# Patient Record
Sex: Female | Born: 1940 | Race: White | Hispanic: No | Smoking: Never smoker
Health system: Southern US, Community
[De-identification: ages and names within clinical notes are randomized; demographics above are authoritative.]

## PROBLEM LIST (undated history)

## (undated) DIAGNOSIS — J449 Chronic obstructive pulmonary disease, unspecified: Secondary | ICD-10-CM

## (undated) DIAGNOSIS — J45909 Unspecified asthma, uncomplicated: Secondary | ICD-10-CM

## (undated) DIAGNOSIS — K429 Umbilical hernia without obstruction or gangrene: Secondary | ICD-10-CM

## (undated) DIAGNOSIS — K769 Liver disease, unspecified: Secondary | ICD-10-CM

## (undated) DIAGNOSIS — I10 Essential (primary) hypertension: Secondary | ICD-10-CM

## (undated) DIAGNOSIS — E119 Type 2 diabetes mellitus without complications: Secondary | ICD-10-CM

## (undated) DIAGNOSIS — M1990 Unspecified osteoarthritis, unspecified site: Secondary | ICD-10-CM

## (undated) HISTORY — PX: CHOLECYSTECTOMY: SHX55

## (undated) HISTORY — PX: BLADDER SURGERY: SHX569

## (undated) HISTORY — PX: ABDOMINAL HYSTERECTOMY: SHX81

## (undated) HISTORY — PX: OTHER SURGICAL HISTORY: SHX169

---

## 2005-03-28 ENCOUNTER — Ambulatory Visit: Payer: Self-pay | Admitting: *Deleted

## 2007-08-18 ENCOUNTER — Inpatient Hospital Stay: Payer: Self-pay | Admitting: Specialist

## 2007-08-18 ENCOUNTER — Other Ambulatory Visit: Payer: Self-pay

## 2008-03-16 ENCOUNTER — Ambulatory Visit: Payer: Self-pay | Admitting: Family Medicine

## 2009-01-03 ENCOUNTER — Ambulatory Visit: Payer: Self-pay | Admitting: Family Medicine

## 2009-12-03 ENCOUNTER — Emergency Department: Payer: Self-pay | Admitting: Emergency Medicine

## 2011-03-14 ENCOUNTER — Emergency Department: Payer: Self-pay | Admitting: Unknown Physician Specialty

## 2011-12-16 ENCOUNTER — Emergency Department: Payer: Self-pay | Admitting: Emergency Medicine

## 2011-12-16 LAB — COMPREHENSIVE METABOLIC PANEL
Albumin: 3.6 g/dL (ref 3.4–5.0)
Alkaline Phosphatase: 118 U/L (ref 50–136)
Anion Gap: 10 (ref 7–16)
BUN: 10 mg/dL (ref 7–18)
Bilirubin,Total: 0.4 mg/dL (ref 0.2–1.0)
Calcium, Total: 8.7 mg/dL (ref 8.5–10.1)
Chloride: 100 mmol/L (ref 98–107)
Co2: 31 mmol/L (ref 21–32)
Creatinine: 0.98 mg/dL (ref 0.60–1.30)
EGFR (Non-African Amer.): 60 — ABNORMAL LOW
SGOT(AST): 49 U/L — ABNORMAL HIGH (ref 15–37)
Sodium: 141 mmol/L (ref 136–145)
Total Protein: 7.9 g/dL (ref 6.4–8.2)

## 2011-12-16 LAB — CBC
HGB: 12.9 g/dL (ref 12.0–16.0)
MCH: 29.7 pg (ref 26.0–34.0)
MCHC: 33.7 g/dL (ref 32.0–36.0)
MCV: 88 fL (ref 80–100)
Platelet: 246 10*3/uL (ref 150–440)
RBC: 4.35 10*6/uL (ref 3.80–5.20)
RDW: 13.3 % (ref 11.5–14.5)
WBC: 7.2 10*3/uL (ref 3.6–11.0)

## 2011-12-16 LAB — SEDIMENTATION RATE: Erythrocyte Sed Rate: 35 mm/hr — ABNORMAL HIGH (ref 0–30)

## 2013-10-09 LAB — CBC
HCT: 40.5 % (ref 35.0–47.0)
HGB: 13.1 g/dL (ref 12.0–16.0)
MCH: 30 pg (ref 26.0–34.0)
MCHC: 32.4 g/dL (ref 32.0–36.0)
MCV: 93 fL (ref 80–100)
PLATELETS: 219 10*3/uL (ref 150–440)
RBC: 4.38 10*6/uL (ref 3.80–5.20)
RDW: 14.3 % (ref 11.5–14.5)
WBC: 7.4 10*3/uL (ref 3.6–11.0)

## 2013-10-09 LAB — COMPREHENSIVE METABOLIC PANEL
ANION GAP: 5 — AB (ref 7–16)
Albumin: 3.1 g/dL — ABNORMAL LOW (ref 3.4–5.0)
Alkaline Phosphatase: 156 U/L — ABNORMAL HIGH
BUN: 6 mg/dL — ABNORMAL LOW (ref 7–18)
Bilirubin,Total: 0.4 mg/dL (ref 0.2–1.0)
CHLORIDE: 103 mmol/L (ref 98–107)
CO2: 29 mmol/L (ref 21–32)
Calcium, Total: 9 mg/dL (ref 8.5–10.1)
Creatinine: 0.9 mg/dL (ref 0.60–1.30)
EGFR (African American): >60
EGFR (Non-African Amer.): >60
GLUCOSE: 157 mg/dL — AB (ref 65–99)
Osmolality: 275 (ref 275–301)
POTASSIUM: 4.1 mmol/L (ref 3.5–5.1)
SGOT(AST): 112 U/L — ABNORMAL HIGH (ref 15–37)
SGPT (ALT): 70 U/L (ref 12–78)
Sodium: 137 mmol/L (ref 136–145)
TOTAL PROTEIN: 7.8 g/dL (ref 6.4–8.2)

## 2013-10-09 LAB — TROPONIN I: Troponin-I: <0.02 ng/mL

## 2013-10-09 LAB — CK TOTAL AND CKMB (NOT AT ARMC)
CK, Total: 331 U/L — ABNORMAL HIGH (ref 21–215)
CK-MB: 1.5 ng/mL (ref 0.5–3.6)

## 2013-10-10 ENCOUNTER — Inpatient Hospital Stay: Payer: Self-pay | Admitting: Internal Medicine

## 2013-10-11 LAB — BASIC METABOLIC PANEL
Anion Gap: 6 — ABNORMAL LOW (ref 7–16)
BUN: 10 mg/dL (ref 7–18)
Calcium, Total: 9.4 mg/dL (ref 8.5–10.1)
Chloride: 99 mmol/L (ref 98–107)
Co2: 27 mmol/L (ref 21–32)
Creatinine: 0.86 mg/dL (ref 0.60–1.30)
EGFR (African American): >60
EGFR (Non-African Amer.): >60
Glucose: 322 mg/dL — ABNORMAL HIGH (ref 65–99)
Osmolality: 276 (ref 275–301)
Potassium: 3.6 mmol/L (ref 3.5–5.1)
Sodium: 132 mmol/L — ABNORMAL LOW (ref 136–145)

## 2013-10-11 LAB — CBC WITH DIFFERENTIAL/PLATELET
BASOS ABS: 0 10*3/uL (ref 0.0–0.1)
Basophil %: 0.3 %
EOS PCT: 0 %
Eosinophil #: 0 10*3/uL (ref 0.0–0.7)
HCT: 39.9 % (ref 35.0–47.0)
HGB: 13.1 g/dL (ref 12.0–16.0)
LYMPHS ABS: 1 10*3/uL (ref 1.0–3.6)
Lymphocyte %: 13.1 %
MCH: 30.1 pg (ref 26.0–34.0)
MCHC: 32.8 g/dL (ref 32.0–36.0)
MCV: 92 fL (ref 80–100)
Monocyte #: 0.3 x10 3/mm (ref 0.2–0.9)
Monocyte %: 3.8 %
NEUTROS ABS: 6.2 10*3/uL (ref 1.4–6.5)
Neutrophil %: 82.8 %
Platelet: 247 10*3/uL (ref 150–440)
RBC: 4.35 10*6/uL (ref 3.80–5.20)
RDW: 14.1 % (ref 11.5–14.5)
WBC: 7.5 10*3/uL (ref 3.6–11.0)

## 2013-10-11 LAB — MAGNESIUM: MAGNESIUM: 1.5 mg/dL — AB

## 2013-10-14 LAB — CULTURE, BLOOD (SINGLE)

## 2015-01-08 NOTE — Discharge Summary (Signed)
PATIENT NAME:  Kendra Clarke, Kendra Clarke MR#:  [id number] DATE OF BIRTH:  Jun 18, 1941  DATE OF ADMISSION:  10/10/2013 DATE OF DISCHARGE:  10/13/2013  DISCHARGE DIAGNOSES: 1.  Bacterial pneumonia.  2.  Accelerated hypertension.  3.  Diabetes mellitus type 2 with uncontrolled blood sugars secondary to steroids.  4.  Chronic obstructive pulmonary disease.   DISCHARGE MEDICATIONS: 1.  Symbicort 160/4.5 two puffs 4 times daily.  2.  Lisinopril 20 mg p.o. daily.  3.  Glipizide XL 5 mg p.o. daily.  4.  Atrovent 2 puffs 4 times daily.  5.  KCl 10 mEq p.o. daily.  6.  Lasix 20 mg p.o. daily.  7.  Amlodipine 10 mg p.o. daily.  8.  Metoprolol 50 mg p.o. q. 12 hours.  9.  Levaquin 750 mg every 24 hours for 5 days.  10.  Tussionex 5 mL every 12 hours for cough.  11.  Prednisone 20 mg 2 tablets daily for 3 days, 1 tablet daily for 3 days and then stop.   NOTE: The patient was given 50 mL of Tussionex and also Levaquin is given for 5 days.  We have increased the Norvasc to 10 mg daily and also started the metoprolol, that is a new medication, for blood pressure.   DIET: Low-sodium, low-fat, ADA diet.   CONSULTATIONS: None.   HOSPITAL COURSE:  1.  Pneumonia. A 74 year old female patient with hypertension and diabetes who came in because of shortness of breath. The patient does have history of hyperlipidemia, diabetes, and hypertension. Had fever and cough at home. The patient's chest x-ray on admission showed left lower lobe pneumonia. The patient was given IV Levaquin and IV Solu-Medrol. The patient was monitored on telemetry. She did improve a lot. She did have some cough but other than that she did not have any fever and O2 sats were like 92% on room air and with exertion they were 89%. The patient was discharged with Levaquin and prednisone. We wanted to see if she qualifies for home oxygen, but O2 sats were 89% on exertion, so discharged home in stable condition. She has COPD history so continue her  Symbicort, Atrovent, and also Levaquin and prednisone were given. The patient can follow up with her primary doctor.  2.  Diabetes mellitus, type 2. The patient had elevated blood sugars up to 400 range during the hospital course because of steroids and also her metformin was on hold because she had a CT chest with contrast, so she had elevated sugars due to combination of not getting metformin and also steroids. The patient's steroids have been tapered and also metformin restarted. At the time of discharge, sugars have been much improved and they were in 200 range.  3.  Accelerated hypertension. The patient's family says she is not really compliant with medications, and the patient's blood pressure stayed around 160s over 90 and also at times it was 190/80. The patient did not have any chest pain, but we had to increase her Norvasc to 10 mg daily and also added Lopressor because her heart rate also was elevated to 100s, so told her to be compliant with medications. Wrote prescriptions for Norvasc 10 mg and Lopressor 50 mg daily, and she is on lisinopril also.   LABORATORY AND DIAGNOSTICS: EKG showed sinus tachycardia at 113 beats per minute. CK is 331 on January 23rd. Troponins have been negative x3. Electrolytes on admission: Sodium 137, potassium 4.1, chloride 103, bicarb 29, BUN 6, creatinine 0.90, and glucose 157. LFTs:  The patient has AST of 112. 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 WBC 7.4 on admission, hemoglobin 13.1, hematocrit 40.5, and platelets 219. Blood cultures have been negative. The patient had a CT angio of the chest for PE protocol. The patient's CT of chest did not show any PE, but has left lower lobe opacity and also has scattered nodules in both lungs, likely post-infectious.   The patient's condition at time of discharge is stable.  TIME SPENT ON DISCHARGE PREPARATION: More than 30 minutes.  ____________________________ Katha HammingSnehalatha Nashanti Duquette,  MD sk:sb D: 10/15/2013 08:53:42 ET T: 10/15/2013 09:25:28 ET JOB#: [id number]  cc: Katha HammingSnehalatha Antia Rahal, MD, <Dictator> Katha HammingSNEHALATHA Nnaemeka Samson MD ELECTRONICALLY SIGNED 10/27/2013 15:30

## 2015-01-08 NOTE — H&P (Signed)
PATIENT NAME:  Kendra Clarke, Kendra Clarke MR#:  [id number] DATE OF BIRTH:  25-Nov-1940  DATE OF ADMISSION:  10/10/2013  PRIMARY CARE PHYSICIAN:  Nonlocal.   REFERRING PHYSICIAN:  Dr. Glenetta Hew.   CHIEF COMPLAINT:  Shortness of breath.   HISTORY OF PRESENT ILLNESS:  The patient is a 74 year old Caucasian female with a past medical history of hypertension, hyperlipidemia, diabetes mellitus, nonalcoholic steatohepatitis is presenting to the ER with a chief complaint of three day history of shortness of breath.  This is associated with dry cough and low-grade fever.  The patient is complaining of right-sided chest pain with cough.  When she is resting she denies any chest pain.  Blood pressure is initially high at 200/88.  Subsequently trended down to 184/91.  The patient's daughter is at bedside.  No similar complaints in the past.  The patient denies any dizziness or loss of consciousness.  She denies any history of COPD.   PAST MEDICAL HISTORY:  Hypertension, hyperlipidemia, diabetes mellitus, NASH, GERD.   PAST SURGICAL HISTORY:  Cholecystectomy.    ALLERGIES:  SHE IS ALLERGIC TO ALBUTEROL, CODEINE AND SULFA.   PSYCHOSOCIAL HISTORY:  Lives with nephew.  No history of smoking, alcohol or illicit drug usage.  The patient never smoked in her life.   FAMILY HISTORY:  Dad has cancer.  Mother has multiple sclerosis.   HOME MEDICATIONS:  Symbicort 2 puff inhalation 4 times a day, Norvasc 5 mg once daily, metformin 1000 mg 2 times a day, lisinopril 20 mg by mouth once daily, glipizide XL 5 mg by mouth once daily, furosemide 20 mg by mouth once daily, Atrovent 2 puffs inhalation 4 times a day.  REVIEW OF SYSTEMS:  CONSTITUTIONAL:  Denies any weakness.  Complaining of fatigue and fever.  EYES:  Denies blurry vision, double vision.  EARS, NOSE, THROAT:  No epistaxis.  Positive nasal discharge.  Complaining of headache.  RESPIRATION:  Complaining of dry cough.  No history of COPD.  CARDIOVASCULAR:  No chest pain  or palpitations.  GASTROINTESTINAL:  Denies nausea, vomiting, diarrhea.  No hematemesis.   GYNECOLOGIC AND BREAST:  Denies breast mass or vaginal discharge.  ENDOCRINE:  Denies polyuria, nocturia, thyroid problems.  HEMATOLOGIC AND LYMPHATIC:  No anemia, easy bruising, bleeding.  INTEGUMENT:  No acne, rash, lesions.  MUSCULOSKELETAL:  No joint pain in the neck and back.  Denies gout.  NEUROLOGIC:  No vertigo or ataxia.  PSYCHIATRIC:  No ADD, OCD.   PHYSICAL EXAMINATION: VITAL SIGNS:  Temperature 99.4, pulse 102, respirations 26, blood pressure 200/88.  Repeat blood pressure is at 184/91, pulse ox is 94%.  GENERAL APPEARANCE:  Not under acute distress.  Moderately built and nourished.  HEENT:  Normocephalic, atraumatic.  Pupils are equally reacting to light and accommodation.  No scleral icterus.  No conjunctival injection.  No sinus tenderness.  Moist mucous membranes.  NECK:  Supple.  No JVD.  No thyromegaly.  LUNGS:  Positive crackles and rhonchi in the left lower lobe.  Moderate air entry.  No wheezing.  CARDIOVASCULAR:  S1, S2 normal.  Regular rate and rhythm.  No murmurs.  GASTROINTESTINAL:  Soft.  Bowel sounds are positive in all four quadrants.  Nontender, nondistended.  No hepatosplenomegaly.  No masses felt.  NEUROLOGIC:  Awake, alert, oriented x 3.  Reflexes are 2+.  Motor and sensory are grossly intact.  EXTREMITIES:  No cyanosis.  No clubbing.  Trace edema is present.  Peripheral pulses are 2+. PSYCHIATRIC:  Normal mood and affect.  LABORATORY AND IMAGING STUDIES:  Glucose 157, BUN 6, creatinine 0.90.  The rest of the Chem-8 is normal, except anion gap which is low at 5.  Total protein 7.8, albumin 3.1, total bili is 0.4, alkaline phosphatase 156, AST 112, ALT 70, CK total 331, CK-MB 1.5.  Troponin less than 0.02.  CBC is normal.  A 12-lead EKG:  Sinus tachycardia at 113 beats per minute.  Normal PR and QRS interval.  No acute ST-T wave changes.   ASSESSMENT AND PLAN:  A  74 year old female presenting to the ER with a chief complaint of three day history of cough, fever and shortness of breath will be admitted with the following assessment and plan.  1.  Left lower lobe pneumonia.  Blood cultures were obtained and the patient is started on IV levofloxacin.  We will provide the patient Solu-Medrol IV for reactive airway disease.  We will provide nebulizer treatment with Atrovent as the patient is ALLERGIC TO ALBUTEROL.  2.  Gastroesophageal reflux disease.  The patient will be on gastrointestinal prophylaxis.  3.  Hypertension.  Blood pressure is very high.  We will resume home medications and up-titrate medications as needed basis.  4.  Hyperlipidemia.  Continue home medication.  5.  History of nonalcoholic steatohepatitis.  6.  Diabetes mellitus.  The patient will be on sliding scale insulin.  7.  We will provide gastrointestinal and deep vein thrombosis prophylaxis.   Diagnosis and plan of care was discussed in detail with the patient and her two daughters at bedside.  They verbalized understanding of the plan.   Total time spent on admission is 45 minutes.     ____________________________ Ramonita LabAruna Avira Tillison, MD ag:ea D: 10/10/2013 02:31:01 ET T: 10/10/2013 03:45:32 ET JOB#: [id number]  cc: Ramonita LabAruna Yurika Pereda, MD, <Dictator> Ramonita LabARUNA Johannah Rozas MD ELECTRONICALLY SIGNED 10/25/2013 2:03

## 2015-05-03 ENCOUNTER — Emergency Department: Payer: Medicare HMO

## 2015-05-03 ENCOUNTER — Encounter: Payer: Self-pay | Admitting: Urgent Care

## 2015-05-03 ENCOUNTER — Emergency Department
Admission: EM | Admit: 2015-05-03 | Discharge: 2015-05-04 | Disposition: A | Discharge status: Home / Self Care | Payer: Medicare HMO | Attending: Emergency Medicine | Admitting: Emergency Medicine

## 2015-05-03 DIAGNOSIS — I10 Essential (primary) hypertension: Secondary | ICD-10-CM | POA: Diagnosis not present

## 2015-05-03 DIAGNOSIS — E669 Obesity, unspecified: Secondary | ICD-10-CM | POA: Insufficient documentation

## 2015-05-03 DIAGNOSIS — Z79899 Other long term (current) drug therapy: Secondary | ICD-10-CM | POA: Insufficient documentation

## 2015-05-03 DIAGNOSIS — R1032 Left lower quadrant pain: Secondary | ICD-10-CM | POA: Insufficient documentation

## 2015-05-03 DIAGNOSIS — R109 Unspecified abdominal pain: Secondary | ICD-10-CM

## 2015-05-03 DIAGNOSIS — E119 Type 2 diabetes mellitus without complications: Secondary | ICD-10-CM | POA: Diagnosis not present

## 2015-05-03 HISTORY — DX: Type 2 diabetes mellitus without complications: E11.9

## 2015-05-03 HISTORY — DX: Unspecified osteoarthritis, unspecified site: M19.90

## 2015-05-03 HISTORY — DX: Chronic obstructive pulmonary disease, unspecified: J44.9

## 2015-05-03 HISTORY — DX: Essential (primary) hypertension: I10

## 2015-05-03 HISTORY — DX: Liver disease, unspecified: K76.9

## 2015-05-03 HISTORY — DX: Umbilical hernia without obstruction or gangrene: K42.9

## 2015-05-03 HISTORY — DX: Unspecified asthma, uncomplicated: J45.909

## 2015-05-03 LAB — URINALYSIS COMPLETE WITH MICROSCOPIC (ARMC ONLY)
Bilirubin Urine: NEGATIVE
Glucose, UA: 50 mg/dL — AB
Hgb urine dipstick: NEGATIVE
Ketones, ur: NEGATIVE mg/dL
Nitrite: NEGATIVE
PH: 5 (ref 5.0–8.0)
PROTEIN: NEGATIVE mg/dL
Specific Gravity, Urine: 1.023 (ref 1.005–1.030)

## 2015-05-03 LAB — CBC
HEMATOCRIT: 41.1 % (ref 35.0–47.0)
HEMOGLOBIN: 13.2 g/dL (ref 12.0–16.0)
MCH: 28.7 pg (ref 26.0–34.0)
MCHC: 32.2 g/dL (ref 32.0–36.0)
MCV: 89.1 fL (ref 80.0–100.0)
Platelets: 213 10*3/uL (ref 150–440)
RBC: 4.61 MIL/uL (ref 3.80–5.20)
RDW: 15 % — ABNORMAL HIGH (ref 11.5–14.5)
WBC: 6.6 10*3/uL (ref 3.6–11.0)

## 2015-05-03 LAB — COMPREHENSIVE METABOLIC PANEL
ALBUMIN: 3.6 g/dL (ref 3.5–5.0)
ALK PHOS: 93 U/L (ref 38–126)
ALT: 32 U/L (ref 14–54)
ANION GAP: 8 (ref 5–15)
AST: 69 U/L — ABNORMAL HIGH (ref 15–41)
BUN: 9 mg/dL (ref 6–20)
CALCIUM: 8.9 mg/dL (ref 8.9–10.3)
CHLORIDE: 105 mmol/L (ref 101–111)
CO2: 28 mmol/L (ref 22–32)
Creatinine, Ser: 1.12 mg/dL — ABNORMAL HIGH (ref 0.44–1.00)
GFR calc Af Amer: 55 mL/min — ABNORMAL LOW (ref >60)
GFR calc non Af Amer: 48 mL/min — ABNORMAL LOW (ref >60)
GLUCOSE: 173 mg/dL — AB (ref 65–99)
Potassium: 3.6 mmol/L (ref 3.5–5.1)
SODIUM: 141 mmol/L (ref 135–145)
Total Bilirubin: 0.8 mg/dL (ref 0.3–1.2)
Total Protein: 7.8 g/dL (ref 6.5–8.1)

## 2015-05-03 LAB — LIPASE, BLOOD: Lipase: 22 U/L (ref 22–51)

## 2015-05-03 MED ORDER — IOHEXOL 300 MG/ML  SOLN
80.0000 mL | Freq: Once | INTRAMUSCULAR | Status: AC | PRN
  Administered 2015-05-03: 80 mL via INTRAVENOUS

## 2015-05-03 MED ORDER — CIPROFLOXACIN HCL 500 MG PO TABS: 500.0000 mg | ORAL_TABLET | Freq: Two times a day (BID) | ORAL | Status: AC

## 2015-05-03 MED ORDER — ONDANSETRON HCL 4 MG PO TABS: 4.0000 mg | ORAL_TABLET | Freq: Three times a day (TID) | ORAL | Status: AC | PRN

## 2015-05-03 MED ORDER — IOHEXOL 240 MG/ML SOLN
25.0000 mL | Freq: Once | INTRAMUSCULAR | Status: AC | PRN
  Administered 2015-05-03: 25 mL via ORAL

## 2015-05-03 MED ORDER — DICYCLOMINE HCL 20 MG PO TABS: 20.0000 mg | ORAL_TABLET | Freq: Three times a day (TID) | ORAL | Status: DC | PRN

## 2015-05-03 MED ORDER — HYDROMORPHONE HCL 2 MG PO TABS
2.0000 mg | ORAL_TABLET | Freq: Once | ORAL | Status: AC
Start: 2015-05-03 — End: 2015-05-03
  Administered 2015-05-03: 2 mg via ORAL
  Filled 2015-05-03: qty 1

## 2015-05-03 NOTE — Discharge Instructions (Signed)
Abdominal Pain, Women °Abdominal (stomach, pelvic, or belly) pain can be caused by many things. It is important to tell your doctor: °· The location of the pain. °· Does it come and go or is it present all the time? °· Are there things that start the pain (eating certain foods, exercise)? °· Are there other symptoms associated with the pain (fever, nausea, vomiting, diarrhea)? °All of this is helpful to know when trying to find the cause of the pain. °CAUSES  °· Stomach: virus or bacteria infection, or ulcer. °· Intestine: appendicitis (inflamed appendix), regional ileitis (Crohn's disease), ulcerative colitis (inflamed colon), irritable bowel syndrome, diverticulitis (inflamed diverticulum of the colon), or cancer of the stomach or intestine. °· Gallbladder disease or stones in the gallbladder. °· Kidney disease, kidney stones, or infection. °· Pancreas infection or cancer. °· Fibromyalgia (pain disorder). °· Diseases of the female organs: °¨ Uterus: fibroid (non-cancerous) tumors or infection. °¨ Fallopian tubes: infection or tubal pregnancy. °¨ Ovary: cysts or tumors. °¨ Pelvic adhesions (scar tissue). °¨ Endometriosis (uterus lining tissue growing in the pelvis and on the pelvic organs). °¨ Pelvic congestion syndrome (female organs filling up with blood just before the menstrual period). °¨ Pain with the menstrual period. °¨ Pain with ovulation (producing an egg). °¨ Pain with an IUD (intrauterine device, birth control) in the uterus. °¨ Cancer of the female organs. °· Functional pain (pain not caused by a disease, may improve without treatment). °· Psychological pain. °· Depression. °DIAGNOSIS  °Your doctor will decide the seriousness of your pain by doing an examination. °· Blood tests. °· X-rays. °· Ultrasound. °· CT scan (computed tomography, special type of X-ray). °· MRI (magnetic resonance imaging). °· Cultures, for infection. °· Barium enema (dye inserted in the large intestine, to better view it with  X-rays). °· Colonoscopy (looking in intestine with a lighted tube). °· Laparoscopy (minor surgery, looking in abdomen with a lighted tube). °· Major abdominal exploratory surgery (looking in abdomen with a large incision). °TREATMENT  °The treatment will depend on the cause of the pain.  °· Many cases can be observed and treated at home. °· Over-the-counter medicines recommended by your caregiver. °· Prescription medicine. °· Antibiotics, for infection. °· Birth control pills, for painful periods or for ovulation pain. °· Hormone treatment, for endometriosis. °· Nerve blocking injections. °· Physical therapy. °· Antidepressants. °· Counseling with a psychologist or psychiatrist. °· Minor or major surgery. °HOME CARE INSTRUCTIONS  °· Do not take laxatives, unless directed by your caregiver. °· Take over-the-counter pain medicine only if ordered by your caregiver. Do not take aspirin because it can cause an upset stomach or bleeding. °· Try a clear liquid diet (broth or water) as ordered by your caregiver. Slowly move to a bland diet, as tolerated, if the pain is related to the stomach or intestine. °· Have a thermometer and take your temperature several times a day, and record it. °· Bed rest and sleep, if it helps the pain. °· Avoid sexual intercourse, if it causes pain. °· Avoid stressful situations. °· Keep your follow-up appointments and tests, as your caregiver orders. °· If the pain does not go away with medicine or surgery, you may try: °¨ Acupuncture. °¨ Relaxation exercises (yoga, meditation). °¨ Group therapy. °¨ Counseling. °SEEK MEDICAL CARE IF:  °· You notice certain foods cause stomach pain. °· Your home care treatment is not helping your pain. °· You need stronger pain medicine. °· You want your IUD removed. °· You feel faint or   lightheaded.  You develop nausea and vomiting.  You develop a rash.  You are having side effects or an allergy to your medicine. SEEK IMMEDIATE MEDICAL CARE IF:   Your  pain does not go away or gets worse.  You have a fever.  Your pain is felt only in portions of the abdomen. The right side could possibly be appendicitis. The left lower portion of the abdomen could be colitis or diverticulitis.  You are passing blood in your stools (bright red or black tarry stools, with or without vomiting).  You have blood in your urine.  You develop chills, with or without a fever.  You pass out. MAKE SURE YOU:   Understand these instructions.  Will watch your condition.  Will get help right away if you are not doing well or get worse. Document Released: 07/01/2007 Document Revised: 01/18/2014 Document Reviewed: 07/21/2009 [facility] Patient Information 2015 [place], [place]. This information is not intended to replace advice given to you by your health care provider. Make sure you discuss any questions you have with your health care provider.  Please return immediately if condition worsens. Please contact her primary physician or the physician you were given for referral. If you have any specialist physicians involved in her treatment and plan please also contact them. Thank you for using [facility]. Return especially for fever, bloody stool, persistent vomiting, or any other new concerns. Discussed with your primary physician and/or surgery concerning her umbilical hernia.

## 2015-05-03 NOTE — ED Notes (Signed)
Patient presents with c/o LLQ abdominal pain that began with acute onset this am. Patient denies N/V/D/F and urinary symptoms. Patient advises that she has a hernia in her abd "somewhere" and she doesn't know if it if from that.

## 2015-05-03 NOTE — ED Notes (Signed)
Pt transported to and from CT via stretcher without incident. 

## 2015-05-03 NOTE — ED Provider Notes (Signed)
Time Seen: Approximately ----------------------------------------- 11:52 PM on 05/03/2015 -----------------------------------------    I have reviewed the triage notes  Chief Complaint: Abdominal Pain   History of Present Illness: Kendra Clarke is a 74 y.o. female who presents with rather acute onset of left-sided abdominal pain that started this morning at 7 AM as been relatively constant throughout the day. She she has a history of large umbilical hernia and states that it doesn't seem to be more tense than usual. She states she has noticed some increased pain surrounding the hernia site and most of her pain today acutely to the left lower quadrant and some occasionally into the back. Any fever. She denies any melena or hematochezia. She denies any loose stool or diarrhea. She denies any urinary complaints such as dysuria, hematuria, urinary frequency. She denies any significant right lower quadrant abdominal pain. She's had a previous surgical history of a cholecystectomy. She has not taken any medications at home for pain. She does state that she's had intermittent nausea throughout the day with no persistent vomiting   Past Medical History  Diagnosis Date  . COPD (chronic obstructive pulmonary disease)   . Diabetes mellitus without complication   . Hypertension   . Arthritis   . Umbilical hernia   . Liver disease   . Asthma     There are no active problems to display for this patient.   Past Surgical History  Procedure Laterality Date  . Abdominal hysterectomy    . Bladder surgery    . Nose surgery    . Cholecystectomy      Past Surgical History  Procedure Laterality Date  . Abdominal hysterectomy    . Bladder surgery    . Nose surgery    . Cholecystectomy      Current Outpatient Rx  Name  Route  Sig  Dispense  Refill  . ATROVENT HFA 17 MCG/ACT inhaler   Inhalation   Inhale 2 puffs into the lungs every 6 (six) hours as needed.           Dispense as  written.   . furosemide (LASIX) 20 MG tablet   Oral   Take 20 mg by mouth.         Marland Kitchen glipiZIDE (GLUCOTROL XL) 2.5 MG 24 hr tablet   Oral   Take 2.5 mg by mouth daily with breakfast.         . lisinopril (PRINIVIL,ZESTRIL) 20 MG tablet   Oral   Take 40 mg by mouth daily.         . metFORMIN (GLUCOPHAGE) 1000 MG tablet   Oral   Take 1,000 mg by mouth 2 (two) times daily with a meal.         . metoprolol (LOPRESSOR) 50 MG tablet   Oral   Take 50 mg by mouth 2 (two) times daily.         Marland Kitchen omeprazole (PRILOSEC) 20 MG capsule   Oral   Take 20 mg by mouth daily.         . potassium chloride (K-DUR,KLOR-CON) 10 MEQ tablet   Oral   Take 10 mEq by mouth every morning.         . SYMBICORT 160-4.5 MCG/ACT inhaler   Oral   Take 1 puff by mouth 2 (two) times daily.           Dispense as written.   . ciprofloxacin (CIPRO) 500 MG tablet   Oral   Take 1 tablet (500  mg total) by mouth 2 (two) times daily.   6 tablet   0   . dicyclomine (BENTYL) 20 MG tablet   Oral   Take 1 tablet (20 mg total) by mouth 3 (three) times daily as needed for spasms.   30 tablet   0   . ondansetron (ZOFRAN) 4 MG tablet   Oral   Take 1 tablet (4 mg total) by mouth every 8 (eight) hours as needed for nausea or vomiting.   15 tablet   1     Allergies:  Sulfa antibiotics  Family History: No family history on file.  Social History: Social History  Substance Use Topics  . Smoking status: Never Smoker   . Smokeless tobacco: None  . Alcohol Use: No     Review of Systems:   10 point review of systems was performed and was otherwise negative:  Constitutional: No fever Eyes: No visual disturbances ENT: No sore throat, ear pain Cardiac: No chest pain Respiratory: No shortness of breath, wheezing, or stridor Abdomen: Abdominal pain reviewed above. Endocrine: No weight loss, No night sweats Extremities: No peripheral edema, cyanosis Skin: No rashes, easy  bruising Neurologic: No focal weakness, trouble with speech or swollowing Urologic: No dysuria, Hematuria, or urinary frequency   Physical Exam:  ED Triage Vitals  Enc Vitals Group     BP 05/03/15 2117 221/100 mmHg     Pulse Rate 05/03/15 2117 107     Resp 05/03/15 2117 20     Temp 05/03/15 2117 98.8 F (37.1 C)     Temp Source 05/03/15 2117 Oral     SpO2 05/03/15 2117 96 %     Weight 05/03/15 2117 190 lb (86.183 kg)     Height 05/03/15 2117  (1.6 m)     Head Cir --      Peak Flow --      Pain Score 05/03/15 2118 10     Pain Loc --      Pain Edu? --      Excl. in GC? --     General: Awake , Alert , and Oriented times 3; GCS 15 Head: Normal cephalic , atraumatic Eyes: Pupils equal , round, reactive to light Nose/Throat: No nasal drainage, patent upper airway without erythema or exudate.  Neck: Supple, Full range of motion, No anterior adenopathy or palpable thyroid masses Lungs: Clear to ascultation without wheezes , rhonchi, or rales Heart: Regular rate, regular rhythm without murmurs , gallops , or rubs Abdomen: Patient has tenderness toward the left lower quadrant. She is moderately obese with a reducible large umbilical hernia. There is some mild tenderness around the site but again the hernia seems to be reducible without difficulty with no clinical evidence of strangulation or obstruction. Bowel sounds are positive and symmetric in all 4 quadrants. No rebound guarding or rigidity. No reproducible flank pain or palpable masses other than her hernia.       Extremities: 2 plus symmetric pulses. No edema, clubbing or cyanosis Neurologic: normal ambulation, Motor symmetric without deficits, sensory intact Skin: warm, dry, no rashes   Labs:   All laboratory work was reviewed including any pertinent negatives or positives listed below:  Labs Reviewed  COMPREHENSIVE METABOLIC PANEL - Abnormal; Notable for the following:    Glucose, Bld 173 (*)    Creatinine, Ser 1.12  (*)    AST 69 (*)    GFR calc non Af Amer 48 (*)    GFR calc Af Amer 55 (*)  All other components within normal limits  CBC - Abnormal; Notable for the following:    RDW 15.0 (*)    All other components within normal limits  URINALYSIS COMPLETEWITH MICROSCOPIC ([facility] ONLY) - Abnormal; Notable for the following:    Color, Urine YELLOW (*)    APPearance CLOUDY (*)    Glucose, UA 50 (*)    Leukocytes, UA 2+ (*)    Bacteria, UA RARE (*)    Squamous Epithelial / LPF TOO NUMEROUS TO COUNT (*)    All other components within normal limits  LIPASE, BLOOD   laboratory work was reviewed which showed what seemed to be some findings consistent with a urinary tract infection develops a somewhat contaminated sample with a lot of squamous epithelial cells.  EKG: None    Radiology:  I personally reviewed the radiologic studies  EXAM: CT ABDOMEN AND PELVIS WITH CONTRAST  TECHNIQUE: Multidetector CT imaging of the abdomen and pelvis was performed using the standard protocol following bolus administration of intravenous contrast.  CONTRAST: 80mL OMNIPAQUE IOHEXOL 300 MG/ML SOLN  COMPARISON: CT of the abdomen and pelvis performed 03/15/2011  FINDINGS: The visualized lung bases are clear.  The mildly nodular contour of the liver raises concern for cirrhosis. The spleen is borderline normal in size. The patient is status post cholecystectomy, with clips noted at the gallbladder fossa. The pancreas and adrenal glands are unremarkable.  The kidneys are unremarkable in appearance. There is no evidence of hydronephrosis. No renal or ureteral stones are seen. No perinephric stranding is appreciated.  No free fluid is identified. The small bowel is unremarkable in appearance. The stomach is within normal limits. No acute vascular abnormalities are seen. Mild scattered calcification is seen along the abdominal aorta.  The appendix is normal in caliber, without evidence  for appendicitis.  There is herniation of part of the transverse colon into a large multilobulated umbilical hernia, without evidence for obstruction or strangulation. A few smaller adjacent anterior abdominal wall hernias are seen. Minimal diverticulosis is noted along the sigmoid colon. The colon is otherwise unremarkable.  The bladder is mildly distended and grossly unremarkable. The patient is status post hysterectomy. A small 2.1 cm cystic focus is noted at the vaginal cuff, likely postoperative in nature. No suspicious adnexal masses are seen. No inguinal lymphadenopathy is seen.  No acute osseous abnormalities are identified.  IMPRESSION: 1. No acute abnormality seen to explain the patient's symptoms. 2. Herniation of part of the transverse colon into a large multilobulated umbilical hernia, without evidence for obstruction or strangulation. Few smaller adjacent anterior abdominal wall hernias seen. 3. Mildly nodular contour of the liver raises concern for hepatic cirrhosis. 4. Mild scattered calcification along the abdominal aorta. 5. Minimal diverticulosis along the sigmoid colon, without evidence of diverticulitis. 6. Small 2.1 cm cystic focus at the vaginal cuff is likely postoperative in nature.   Procedures: None   Critical Care: *None    ED Course:  Patient's stay was uneventful she was given some oral Dilaudid for pain. She has some mild consistent nausea but no vomiting here in emergency department. Patient I felt could be treated on an outpatient basis as her CAT scan shows no obvious evidence of a surgical abdomen. Her differential primarily revolved around obstructive for strangulation of her hernia site. There was also concern for possibility of diverticulitis. Her urine may or may not be infected and I felt she would benefit with some Cipro Roxicet and she was also discharged on Bentyl and Zofran.  She's been advised to follow-up with her primary physician  with concerns about whether or not she'll need surgery on her umbilical hernia.    Assessment: Acute unspecified left lower quadrant abdominal pain in a female   Final diagnoses:  Abdominal pain in female     Plan: Please return immediately if condition worsens. Please contact her primary physician or the physician you were given for referral. If you have any specialist physicians involved in her treatment and plan please also contact them. Thank you for using [facility].             Jennye Moccasin, MD 05/03/15 [phone]

## 2015-05-03 NOTE — ED Notes (Signed)
Pt states, "I am hurting on the lower left side. I have a hernia and it hurts up to where my hernia is around my belly button." Denies N/V/D. Pt AAOx4. NAD noted. RR even and nonlabored.

## 2015-05-03 NOTE — ED Notes (Signed)
MD at bedside for reeval

## 2017-11-30 ENCOUNTER — Inpatient Hospital Stay
Admission: EM | Admit: 2017-11-30 | Discharge: 2017-12-03 | DRG: 872 | Disposition: A | Discharge status: Home Health | Payer: Medicare HMO | Attending: Family Medicine | Admitting: Family Medicine

## 2017-11-30 DIAGNOSIS — H44001 Unspecified purulent endophthalmitis, right eye: Secondary | ICD-10-CM | POA: Diagnosis present

## 2017-11-30 DIAGNOSIS — E1165 Type 2 diabetes mellitus with hyperglycemia: Secondary | ICD-10-CM | POA: Diagnosis not present

## 2017-11-30 DIAGNOSIS — E876 Hypokalemia: Secondary | ICD-10-CM | POA: Diagnosis present

## 2017-11-30 DIAGNOSIS — A4189 Other specified sepsis: Secondary | ICD-10-CM | POA: Diagnosis not present

## 2017-11-30 DIAGNOSIS — Z885 Allergy status to narcotic agent status: Secondary | ICD-10-CM

## 2017-11-30 DIAGNOSIS — Z634 Disappearance and death of family member: Secondary | ICD-10-CM

## 2017-11-30 DIAGNOSIS — R441 Visual hallucinations: Secondary | ICD-10-CM | POA: Diagnosis present

## 2017-11-30 DIAGNOSIS — I10 Essential (primary) hypertension: Secondary | ICD-10-CM | POA: Diagnosis present

## 2017-11-30 DIAGNOSIS — E878 Other disorders of electrolyte and fluid balance, not elsewhere classified: Secondary | ICD-10-CM | POA: Diagnosis present

## 2017-11-30 DIAGNOSIS — Z7984 Long term (current) use of oral hypoglycemic drugs: Secondary | ICD-10-CM

## 2017-11-30 DIAGNOSIS — J441 Chronic obstructive pulmonary disease with (acute) exacerbation: Secondary | ICD-10-CM | POA: Diagnosis present

## 2017-11-30 DIAGNOSIS — T380X5A Adverse effect of glucocorticoids and synthetic analogues, initial encounter: Secondary | ICD-10-CM | POA: Diagnosis not present

## 2017-11-30 DIAGNOSIS — F329 Major depressive disorder, single episode, unspecified: Secondary | ICD-10-CM | POA: Diagnosis present

## 2017-11-30 DIAGNOSIS — Z8249 Family history of ischemic heart disease and other diseases of the circulatory system: Secondary | ICD-10-CM

## 2017-11-30 DIAGNOSIS — J09X2 Influenza due to identified novel influenza A virus with other respiratory manifestations: Secondary | ICD-10-CM

## 2017-11-30 DIAGNOSIS — A419 Sepsis, unspecified organism: Secondary | ICD-10-CM

## 2017-11-30 DIAGNOSIS — J101 Influenza due to other identified influenza virus with other respiratory manifestations: Secondary | ICD-10-CM | POA: Diagnosis present

## 2017-11-30 DIAGNOSIS — K219 Gastro-esophageal reflux disease without esophagitis: Secondary | ICD-10-CM | POA: Diagnosis present

## 2017-11-30 DIAGNOSIS — E871 Hypo-osmolality and hyponatremia: Secondary | ICD-10-CM | POA: Diagnosis present

## 2017-11-30 DIAGNOSIS — Z818 Family history of other mental and behavioral disorders: Secondary | ICD-10-CM

## 2017-11-30 DIAGNOSIS — Z882 Allergy status to sulfonamides status: Secondary | ICD-10-CM

## 2017-11-30 DIAGNOSIS — R05 Cough: Secondary | ICD-10-CM | POA: Diagnosis not present

## 2017-11-30 DIAGNOSIS — K7581 Nonalcoholic steatohepatitis (NASH): Secondary | ICD-10-CM | POA: Diagnosis present

## 2017-11-30 DIAGNOSIS — F4321 Adjustment disorder with depressed mood: Secondary | ICD-10-CM

## 2017-11-30 NOTE — ED Provider Notes (Signed)
[facility] Emergency Department Provider Note  ____________________________________________   First MD Initiated Contact with Patient 11/30/17 2356     (approximate)  I have reviewed the triage vital signs and the nursing notes.   HISTORY  Chief Complaint Cough; Shortness of Breath; and Nasal Congestion   HPI Kendra GoslingOdelia A Clarke is a 77 y.o. female who comes to the emergency department via EMS with active cough, chest pain, and shortness of breath for the past week or so.  The patient has been febrile and has had difficulty catching her breath.  She has a known history of COPD but does not use oxygen.  According to EMS she had initial oxygen sat of 68% although came up with nasal cannula.  She was noted to be febrile to 101.4 degrees in route.  Her symptoms have slowly progressed over the past several days are now moderate to severe.  They are worse with exertion and improved with rest.  She has been living with her daughter who died unexpectedly yesterday.  The patient denies drinking alcohol.  Her chest pain is sharp moderate severity diffusely across her upper chest nonradiating worse when coughing improved when not coughing.  Past Medical History:  Diagnosis Date  . Arthritis   . Asthma   . COPD (chronic obstructive pulmonary disease) (HCC)   . Diabetes mellitus without complication (HCC)   . Hypertension   . Liver disease   . Umbilical hernia     Patient Active Problem List   Diagnosis Date Noted  . Grief at loss of child 12/02/2017  . Sepsis (HCC) 12/01/2017    Past Surgical History:  Procedure Laterality Date  . ABDOMINAL HYSTERECTOMY    . BLADDER SURGERY    . CHOLECYSTECTOMY    . nose surgery      Prior to Admission medications   Medication Sig Start Date End Date Taking? Authorizing Provider  amLODipine (NORVASC) 10 MG tablet Take 10 mg by mouth daily. 09/24/17  Yes [provider]  glipiZIDE (GLUCOTROL XL) 10 MG 24 hr tablet Take  10 mg by mouth daily. 09/24/17  Yes [provider]  metFORMIN (GLUCOPHAGE) 1000 MG tablet Take 1,000 mg by mouth 2 (two) times daily with a meal.   Yes [provider]  TRADJENTA 5 MG TABS tablet Take 5 mg by mouth daily. 09/25/17  Yes [provider]  amoxicillin-clavulanate (AUGMENTIN) 875-125 MG tablet Take 1 tablet by mouth every 12 (twelve) hours. 12/03/17   Salary, Evelena AsaMontell D, MD  ATROVENT HFA 17 MCG/ACT inhaler Inhale 2 puffs into the lungs every 4 (four) hours as needed for wheezing. 12/03/17   Salary, Evelena AsaMontell D, MD  dicyclomine (BENTYL) 20 MG tablet Take 1 tablet (20 mg total) by mouth 3 (three) times daily as needed for spasms. Patient not taking: Reported on 12/02/2017 05/03/15   Jennye MoccasinQuigley, Brian S, MD  erythromycin ophthalmic ointment Place into the right eye every 6 (six) hours. 12/03/17   Salary, Evelena AsaMontell D, MD  Ipratropium-Albuterol (COMBIVENT RESPIMAT) 20-100 MCG/ACT AERS respimat Inhale 1 puff into the lungs every 6 (six) hours. 12/03/17   Salary, Evelena AsaMontell D, MD  lactobacillus (FLORANEX/LACTINEX) PACK Take 1 packet (1 g total) by mouth 3 (three) times daily with meals. 12/03/17   Salary, Evelena AsaMontell D, MD  loperamide (IMODIUM) 2 MG capsule Take 1 capsule (2 mg total) by mouth as needed for diarrhea or loose stools. 12/03/17   Salary, Evelena AsaMontell D, MD  Multiple Vitamin (MULTIVITAMIN WITH MINERALS) TABS tablet Take  1 tablet by mouth daily. 12/04/17   Salary, Evelena Asa, MD  oseltamivir (TAMIFLU) 30 MG capsule Take 1 capsule (30 mg total) by mouth 2 (two) times daily. 12/03/17   Salary, Evelena Asa, MD  predniSONE (DELTASONE) 50 MG tablet Take 0.5 tablets (25 mg total) by mouth daily with breakfast. 12/04/17   Salary, Evelena Asa, MD  protein supplement shake (PREMIER PROTEIN) LIQD Take 325 mLs (11 oz total) by mouth 2 (two) times daily between meals. 12/03/17   Salary, Evelena Asa, MD    Allergies Codeine and Sulfa antibiotics  History reviewed. No pertinent family history.  Social  History Social History   Tobacco Use  . Smoking status: Never Smoker  . Smokeless tobacco: Never Used  Substance Use Topics  . Alcohol use: No  . Drug use: No    Review of Systems Constitutional: Positive for fevers Eyes: No visual changes. ENT: No sore throat. Cardiovascular: Positive for chest pain. Respiratory: Positive for shortness of breath. Gastrointestinal: No abdominal pain.  No nausea, no vomiting.  No diarrhea.  No constipation. Genitourinary: Negative for dysuria. Musculoskeletal: Negative for back pain. Skin: Negative for rash. Neurological: Negative for headaches, focal weakness or numbness.   ____________________________________________   PHYSICAL EXAM:  VITAL SIGNS: ED Triage Vitals  Enc Vitals Group     BP      Pulse      Resp      Temp      Temp src      SpO2      Weight      Height      Head Circumference      Peak Flow      Pain Score      Pain Loc      Pain Edu?      Excl. in GC?     Constitutional: Ill-appearing in mild to moderate respiratory distress speaking in short sentences mildly diaphoretic Eyes: PERRL EOMI. Head: Atraumatic. Nose: No congestion/rhinnorhea. Mouth/Throat: No trismus Neck: No stridor.   Cardiovascular: Tachycardic rate, regular rhythm. Grossly normal heart sounds.  Good peripheral circulation. Respiratory: Increased respiratory effort using accessory muscles with coarse breath sounds throughout appears quite uncomfortable Gastrointestinal: Soft nontender Musculoskeletal: No lower extremity edema   Neurologic:  No gross focal neurologic deficits are appreciated. Skin: Mild diaphoresis Psychiatric: Anxious appearing    ____________________________________________   DIFFERENTIAL includes but not limited to  Sepsis, pneumonia, influenza, pneumothorax  ____________________________________________   LABS (all labs ordered are listed, but only abnormal results are displayed)  Labs Reviewed  CULTURE, BLOOD  (ROUTINE X 2) - Abnormal; Notable for the following components:      Result Value   Culture   (*)    Value: HAEMOPHILUS INFLUENZAE BETA LACTAMASE POSITIVE Performed at [facility], [address], [place], [place] [address]    All other components within normal limits  BLOOD CULTURE ID PANEL (REFLEXED) - Abnormal; Notable for the following components:   Haemophilus influenzae DETECTED (*)    All other components within normal limits  LACTIC ACID, PLASMA - Abnormal; Notable for the following components:   Lactic Acid, Venous 2.1 (*)    All other components within normal limits  COMPREHENSIVE METABOLIC PANEL - Abnormal; Notable for the following components:   Sodium 129 (*)    Potassium 2.9 (*)    Chloride 89 (*)    Glucose, Bld 287 (*)    BUN 45 (*)    Creatinine, Ser 1.67 (*)  Calcium 7.6 (*)    Albumin 2.9 (*)    AST 71 (*)    Total Bilirubin 4.6 (*)    GFR calc non Af Amer 29 (*)    GFR calc Af Amer 33 (*)    Anion gap 16 (*)    All other components within normal limits  CBC WITH DIFFERENTIAL/PLATELET - Abnormal; Notable for the following components:   Lymphs Abs 0.9 (*)    Monocytes Absolute 1.3 (*)    All other components within normal limits  URINALYSIS, ROUTINE W REFLEX MICROSCOPIC - Abnormal; Notable for the following components:   Color, Urine AMBER (*)    APPearance HAZY (*)    Hgb urine dipstick SMALL (*)    Protein, ur 30 (*)    Bacteria, UA MANY (*)    Squamous Epithelial / LPF 0-5 (*)    All other components within normal limits  INFLUENZA PANEL BY PCR (TYPE A & B) - Abnormal; Notable for the following components:   Influenza A By PCR POSITIVE (*)    All other components within normal limits  GLUCOSE, CAPILLARY - Abnormal; Notable for the following components:   Glucose-Capillary 271 (*)    All other components within normal limits  GLUCOSE, CAPILLARY - Abnormal; Notable for the following components:   Glucose-Capillary 260 (*)    All other  components within normal limits  GLUCOSE, CAPILLARY - Abnormal; Notable for the following components:   Glucose-Capillary 274 (*)    All other components within normal limits  GLUCOSE, CAPILLARY - Abnormal; Notable for the following components:   Glucose-Capillary 369 (*)    All other components within normal limits  GLUCOSE, CAPILLARY - Abnormal; Notable for the following components:   Glucose-Capillary 270 (*)    All other components within normal limits  GLUCOSE, CAPILLARY - Abnormal; Notable for the following components:   Glucose-Capillary 286 (*)    All other components within normal limits  BASIC METABOLIC PANEL - Abnormal; Notable for the following components:   Sodium 131 (*)    Chloride 98 (*)    CO2 20 (*)    Glucose, Bld 366 (*)    BUN 31 (*)    Calcium 7.1 (*)    GFR calc non Af Amer 58 (*)    All other components within normal limits  GLUCOSE, CAPILLARY - Abnormal; Notable for the following components:   Glucose-Capillary 343 (*)    All other components within normal limits  GLUCOSE, CAPILLARY - Abnormal; Notable for the following components:   Glucose-Capillary 422 (*)    All other components within normal limits  GLUCOSE, CAPILLARY - Abnormal; Notable for the following components:   Glucose-Capillary 112 (*)    All other components within normal limits  GLUCOSE, CAPILLARY - Abnormal; Notable for the following components:   Glucose-Capillary 215 (*)    All other components within normal limits  GLUCOSE, CAPILLARY - Abnormal; Notable for the following components:   Glucose-Capillary 196 (*)    All other components within normal limits  BLOOD GAS, ARTERIAL - Abnormal; Notable for the following components:   pH, Arterial 7.48 (*)    pO2, Arterial 74 (*)    Acid-Base Excess 2.2 (*)    All other components within normal limits  GLUCOSE, CAPILLARY - Abnormal; Notable for the following components:   Glucose-Capillary 212 (*)    All other components within normal  limits  CULTURE, BLOOD (ROUTINE X 2)  URINE CULTURE  CULTURE, EXPECTORATED SPUTUM-ASSESSMENT  C DIFFICILE  QUICK SCREEN W PCR REFLEX  CULTURE, EXPECTORATED SPUTUM-ASSESSMENT  LACTIC ACID, PLASMA  LIPASE, BLOOD  TROPONIN I  PROCALCITONIN  PROTIME-INR  HIV ANTIBODY (ROUTINE TESTING)  STREP PNEUMONIAE URINARY ANTIGEN  PROCALCITONIN  LEGIONELLA PNEUMOPHILA SEROGP 1 UR AG    Lab work reviewed by me shows the patient is influenza positive.  Elevated lactic acid concerning for sepsis __________________________________________  EKG  ED ECG REPORT I, Merrily Brittle, the attending physician, personally viewed and interpreted this ECG.  Date: 12/03/2017 EKG Time:  Rate: 112 Rhythm: Sinus tachycardia QRS Axis: normal Intervals: normal ST/T Wave abnormalities: normal Narrative Interpretation: no evidence of acute ischemia  ____________________________________________  RADIOLOGY  Chest x-ray reviewed by me concerning for acute bronchitis ____________________________________________   PROCEDURES  Procedure(s) performed: no  .Critical Care Performed by: Merrily Brittle, MD Authorized by: Merrily Brittle, MD   Critical care provider statement:    Critical care time (minutes):  35   Critical care time was exclusive of:  Separately billable procedures and treating other patients   Critical care was necessary to treat or prevent imminent or life-threatening deterioration of the following conditions:  Sepsis and respiratory failure   Critical care was time spent personally by me on the following activities:  Development of treatment plan with patient or surrogate, discussions with consultants, evaluation of patient's response to treatment, examination of patient, obtaining history from patient or surrogate, ordering and performing treatments and interventions, ordering and review of laboratory studies, ordering and review of radiographic studies, pulse oximetry, re-evaluation of  patient's condition and review of old charts    Critical Care performed: Yes  Observation: no ____________________________________________   INITIAL IMPRESSION / ASSESSMENT AND PLAN / ED COURSE  Pertinent labs & imaging results that were available during my care of the patient were reviewed by me and considered in my medical decision making (see chart for details).  The patient arrives febrile, tachycardic, tachypneic, and hypoxic on room air concerning for sepsis of pulmonary origin.  She does come up on nasal cannula.  Differential is broad but most concerning would be influenza and sepsis.  Broad lab work including blood cultures lactic acid are pending and I will treat her with ceftriaxone and azithromycin now as she has no recent hospitalizations and this would be hospital acquired pneumonia.  Presumptive Tamiflu now given high clinical suspicion.  The patient does feel somewhat improved after IV fluids and Tylenol.  Chest x-ray with no focal infiltrate however she is influenza A positive.  At this point the patient remains hypoxic on room air and as such she requires inpatient admission for IV fluids, IV antibiotics, Tamiflu, and treatment of her sepsis.  I discussed with the patient and family who verbalized understanding and agreement the plan.  I then discussed with the hospitalist who has graciously agreed to admit the patient to his service.      ____________________________________________   FINAL CLINICAL IMPRESSION(S) / ED DIAGNOSES  Final diagnoses:  Sepsis, due to unspecified organism (HCC)  Influenza due to identified novel influenza A virus with other respiratory manifestations      NEW MEDICATIONS STARTED DURING THIS VISIT:  Discharge Medication List as of 12/03/2017  2:57 PM    START taking these medications   Details  amoxicillin-clavulanate (AUGMENTIN) 875-125 MG tablet Take 1 tablet by mouth every 12 (twelve) hours., Starting Tue 12/03/2017, Print      erythromycin ophthalmic ointment Place into the right eye every 6 (six) hours., Starting Tue 12/03/2017, Print    Ipratropium-Albuterol (  COMBIVENT RESPIMAT) 20-100 MCG/ACT AERS respimat Inhale 1 puff into the lungs every 6 (six) hours., Starting Tue 12/03/2017, Print    lactobacillus (FLORANEX/LACTINEX) PACK Take 1 packet (1 g total) by mouth 3 (three) times daily with meals., Starting Tue 12/03/2017, Print    loperamide (IMODIUM) 2 MG capsule Take 1 capsule (2 mg total) by mouth as needed for diarrhea or loose stools., Starting Tue 12/03/2017, Print    Multiple Vitamin (MULTIVITAMIN WITH MINERALS) TABS tablet Take 1 tablet by mouth daily., Starting Wed 12/04/2017, Print    oseltamivir (TAMIFLU) 30 MG capsule Take 1 capsule (30 mg total) by mouth 2 (two) times daily., Starting Tue 12/03/2017, Print    protein supplement shake (PREMIER PROTEIN) LIQD Take 325 mLs (11 oz total) by mouth 2 (two) times daily between meals., Starting Tue 12/03/2017, Print         Note:  This document was prepared using Dragon voice recognition software and may include unintentional dictation errors.     Merrily Brittle, MD 12/03/17 2133

## 2017-11-30 NOTE — ED Triage Notes (Signed)
Pt arrived via EMS from home with complaints of cough, congestion, and coughing up yellow sputum. Pt has been sick like this for over a week. Pt lives with son. EMS reported that pt's daughter died in the their home on yesterday (the story is very unclear on that). Pt is alert and oriented x 4. Pt has Hx of diabetes. She is prescribed Metformin/insulin for it but EMS reports that she hasn't been taking it. VS per EMS BP-158/70 HR-120s BS-344 Temp-101.4 O2sat-68%. Family is on the way to hospital.

## 2017-12-01 ENCOUNTER — Emergency Department: Payer: Medicare HMO

## 2017-12-01 ENCOUNTER — Other Ambulatory Visit: Payer: Self-pay

## 2017-12-01 DIAGNOSIS — Z634 Disappearance and death of family member: Secondary | ICD-10-CM | POA: Diagnosis not present

## 2017-12-01 DIAGNOSIS — E1165 Type 2 diabetes mellitus with hyperglycemia: Secondary | ICD-10-CM | POA: Diagnosis not present

## 2017-12-01 DIAGNOSIS — Z818 Family history of other mental and behavioral disorders: Secondary | ICD-10-CM | POA: Diagnosis not present

## 2017-12-01 DIAGNOSIS — A419 Sepsis, unspecified organism: Secondary | ICD-10-CM | POA: Diagnosis present

## 2017-12-01 DIAGNOSIS — T380X5A Adverse effect of glucocorticoids and synthetic analogues, initial encounter: Secondary | ICD-10-CM | POA: Diagnosis not present

## 2017-12-01 DIAGNOSIS — J101 Influenza due to other identified influenza virus with other respiratory manifestations: Secondary | ICD-10-CM | POA: Diagnosis present

## 2017-12-01 DIAGNOSIS — Z7984 Long term (current) use of oral hypoglycemic drugs: Secondary | ICD-10-CM | POA: Diagnosis not present

## 2017-12-01 DIAGNOSIS — K7581 Nonalcoholic steatohepatitis (NASH): Secondary | ICD-10-CM | POA: Diagnosis present

## 2017-12-01 DIAGNOSIS — I10 Essential (primary) hypertension: Secondary | ICD-10-CM | POA: Diagnosis present

## 2017-12-01 DIAGNOSIS — A4189 Other specified sepsis: Secondary | ICD-10-CM | POA: Diagnosis present

## 2017-12-01 DIAGNOSIS — E876 Hypokalemia: Secondary | ICD-10-CM | POA: Diagnosis present

## 2017-12-01 DIAGNOSIS — H44001 Unspecified purulent endophthalmitis, right eye: Secondary | ICD-10-CM | POA: Diagnosis present

## 2017-12-01 DIAGNOSIS — E871 Hypo-osmolality and hyponatremia: Secondary | ICD-10-CM | POA: Diagnosis present

## 2017-12-01 DIAGNOSIS — R441 Visual hallucinations: Secondary | ICD-10-CM | POA: Diagnosis present

## 2017-12-01 DIAGNOSIS — F4321 Adjustment disorder with depressed mood: Secondary | ICD-10-CM | POA: Diagnosis not present

## 2017-12-01 DIAGNOSIS — Z8249 Family history of ischemic heart disease and other diseases of the circulatory system: Secondary | ICD-10-CM | POA: Diagnosis not present

## 2017-12-01 DIAGNOSIS — K219 Gastro-esophageal reflux disease without esophagitis: Secondary | ICD-10-CM | POA: Diagnosis present

## 2017-12-01 DIAGNOSIS — R05 Cough: Secondary | ICD-10-CM | POA: Diagnosis present

## 2017-12-01 DIAGNOSIS — E878 Other disorders of electrolyte and fluid balance, not elsewhere classified: Secondary | ICD-10-CM | POA: Diagnosis present

## 2017-12-01 DIAGNOSIS — F329 Major depressive disorder, single episode, unspecified: Secondary | ICD-10-CM | POA: Diagnosis present

## 2017-12-01 DIAGNOSIS — Z882 Allergy status to sulfonamides status: Secondary | ICD-10-CM | POA: Diagnosis not present

## 2017-12-01 DIAGNOSIS — J441 Chronic obstructive pulmonary disease with (acute) exacerbation: Secondary | ICD-10-CM | POA: Diagnosis present

## 2017-12-01 DIAGNOSIS — Z885 Allergy status to narcotic agent status: Secondary | ICD-10-CM | POA: Diagnosis not present

## 2017-12-01 LAB — CBC WITH DIFFERENTIAL/PLATELET
Basophils Absolute: 0 10*3/uL (ref 0–0.1)
Basophils Relative: 0 %
EOS ABS: 0 10*3/uL (ref 0–0.7)
Eosinophils Relative: 0 %
HCT: 39.9 % (ref 35.0–47.0)
Hemoglobin: 13.7 g/dL (ref 12.0–16.0)
LYMPHS ABS: 0.9 10*3/uL — AB (ref 1.0–3.6)
Lymphocytes Relative: 11 %
MCH: 30.3 pg (ref 26.0–34.0)
MCHC: 34.2 g/dL (ref 32.0–36.0)
MCV: 88.5 fL (ref 80.0–100.0)
Monocytes Absolute: 1.3 10*3/uL — ABNORMAL HIGH (ref 0.2–0.9)
Monocytes Relative: 15 %
Neutro Abs: 6.4 10*3/uL (ref 1.4–6.5)
Neutrophils Relative %: 74 %
Platelets: 203 10*3/uL (ref 150–440)
RBC: 4.5 MIL/uL (ref 3.80–5.20)
RDW: 12.7 % (ref 11.5–14.5)
WBC: 8.7 10*3/uL (ref 3.6–11.0)

## 2017-12-01 LAB — URINALYSIS, ROUTINE W REFLEX MICROSCOPIC
BILIRUBIN URINE: NEGATIVE
Glucose, UA: NEGATIVE mg/dL
Ketones, ur: NEGATIVE mg/dL
Leukocytes, UA: NEGATIVE
Nitrite: NEGATIVE
PROTEIN: 30 mg/dL — AB
RBC / HPF: NONE SEEN RBC/hpf (ref 0–5)
SPECIFIC GRAVITY, URINE: 1.015 (ref 1.005–1.030)
pH: 5 (ref 5.0–8.0)

## 2017-12-01 LAB — COMPREHENSIVE METABOLIC PANEL
ALBUMIN: 2.9 g/dL — AB (ref 3.5–5.0)
ALK PHOS: 113 U/L (ref 38–126)
ALT: 38 U/L (ref 14–54)
ANION GAP: 16 — AB (ref 5–15)
AST: 71 U/L — ABNORMAL HIGH (ref 15–41)
BILIRUBIN TOTAL: 4.6 mg/dL — AB (ref 0.3–1.2)
BUN: 45 mg/dL — ABNORMAL HIGH (ref 6–20)
CO2: 24 mmol/L (ref 22–32)
Calcium: 7.6 mg/dL — ABNORMAL LOW (ref 8.9–10.3)
Chloride: 89 mmol/L — ABNORMAL LOW (ref 101–111)
Creatinine, Ser: 1.67 mg/dL — ABNORMAL HIGH (ref 0.44–1.00)
GFR calc Af Amer: 33 mL/min — ABNORMAL LOW (ref >60)
GFR calc non Af Amer: 29 mL/min — ABNORMAL LOW (ref >60)
GLUCOSE: 287 mg/dL — AB (ref 65–99)
Potassium: 2.9 mmol/L — ABNORMAL LOW (ref 3.5–5.1)
Sodium: 129 mmol/L — ABNORMAL LOW (ref 135–145)
TOTAL PROTEIN: 7.6 g/dL (ref 6.5–8.1)

## 2017-12-01 LAB — GLUCOSE, CAPILLARY
GLUCOSE-CAPILLARY: 369 mg/dL — AB (ref 65–99)
GLUCOSE-CAPILLARY: 270 mg/dL — AB (ref 65–99)
Glucose-Capillary: 271 mg/dL — ABNORMAL HIGH (ref 65–99)
Glucose-Capillary: 260 mg/dL — ABNORMAL HIGH (ref 65–99)
Glucose-Capillary: 274 mg/dL — ABNORMAL HIGH (ref 65–99)
Glucose-Capillary: 286 mg/dL — ABNORMAL HIGH (ref 65–99)

## 2017-12-01 LAB — INFLUENZA PANEL BY PCR (TYPE A & B)
INFLAPCR: POSITIVE — AB
INFLBPCR: NEGATIVE

## 2017-12-01 LAB — EXPECTORATED SPUTUM ASSESSMENT W GRAM STAIN, RFLX TO RESP C

## 2017-12-01 LAB — LACTIC ACID, PLASMA
Lactic Acid, Venous: 2.1 mmol/L (ref 0.5–1.9)
Lactic Acid, Venous: 1.3 mmol/L (ref 0.5–1.9)

## 2017-12-01 LAB — LIPASE, BLOOD: Lipase: 27 U/L (ref 11–51)

## 2017-12-01 LAB — PROTIME-INR
INR: 1.1
Prothrombin Time: 14.1 seconds (ref 11.4–15.2)

## 2017-12-01 LAB — TROPONIN I: Troponin I: <0.03 ng/mL (ref <0.03)

## 2017-12-01 LAB — STREP PNEUMONIAE URINARY ANTIGEN: STREP PNEUMO URINARY ANTIGEN: NEGATIVE

## 2017-12-01 LAB — PROCALCITONIN: PROCALCITONIN: 7.1 ng/mL

## 2017-12-01 LAB — EXPECTORATED SPUTUM ASSESSMENT W REFEX TO RESP CULTURE

## 2017-12-01 MED ORDER — GLIPIZIDE ER 2.5 MG PO TB24
2.5000 mg | ORAL_TABLET | Freq: Every day | ORAL | Status: DC
  Administered 2017-12-01 – 2017-12-03 (×3): 2.5 mg via ORAL
  Filled 2017-12-01 (×3): qty 1

## 2017-12-01 MED ORDER — ERYTHROMYCIN 5 MG/GM OP OINT
TOPICAL_OINTMENT | Freq: Four times a day (QID) | OPHTHALMIC | Status: DC
  Administered 2017-12-01 (×4): 1 via OPHTHALMIC
  Administered 2017-12-02: 17:00:00 via OPHTHALMIC
  Administered 2017-12-02 – 2017-12-03 (×6): 1 via OPHTHALMIC
  Filled 2017-12-01 (×2): qty 3.5

## 2017-12-01 MED ORDER — HYDRALAZINE HCL 20 MG/ML IJ SOLN: 10.0000 mg | INTRAMUSCULAR | Status: DC | PRN

## 2017-12-01 MED ORDER — ORAL CARE MOUTH RINSE
15.0000 mL | Freq: Two times a day (BID) | OROMUCOSAL | Status: DC
  Administered 2017-12-01 – 2017-12-02 (×3): 15 mL via OROMUCOSAL

## 2017-12-01 MED ORDER — LORAZEPAM 2 MG/ML IJ SOLN
0.5000 mg | Freq: Once | INTRAMUSCULAR | Status: AC
  Administered 2017-12-01: 0.5 mg via INTRAVENOUS
  Filled 2017-12-01: qty 1

## 2017-12-01 MED ORDER — TRAZODONE HCL 50 MG PO TABS
50.0000 mg | ORAL_TABLET | Freq: Every evening | ORAL | Status: DC | PRN
  Administered 2017-12-01: 50 mg via ORAL
  Filled 2017-12-01: qty 1

## 2017-12-01 MED ORDER — METOPROLOL TARTRATE 50 MG PO TABS
50.0000 mg | ORAL_TABLET | Freq: Two times a day (BID) | ORAL | Status: DC
  Administered 2017-12-01 – 2017-12-03 (×6): 50 mg via ORAL
  Filled 2017-12-01 (×6): qty 1

## 2017-12-01 MED ORDER — INSULIN ASPART 100 UNIT/ML ~~LOC~~ SOLN
0.0000 [IU] | Freq: Three times a day (TID) | SUBCUTANEOUS | Status: DC
  Administered 2017-12-01: 20 [IU] via SUBCUTANEOUS
  Administered 2017-12-01 (×2): 11 [IU] via SUBCUTANEOUS
  Administered 2017-12-02: 10:00:00 15 [IU] via SUBCUTANEOUS
  Administered 2017-12-03: 7 [IU] via SUBCUTANEOUS
  Administered 2017-12-03: 4 [IU] via SUBCUTANEOUS
  Filled 2017-12-01 (×6): qty 1

## 2017-12-01 MED ORDER — METHYLPREDNISOLONE SODIUM SUCC 40 MG IJ SOLR
40.0000 mg | Freq: Four times a day (QID) | INTRAMUSCULAR | Status: DC
  Administered 2017-12-01: 03:00:00 40 mg via INTRAVENOUS
  Filled 2017-12-01: qty 1

## 2017-12-01 MED ORDER — INSULIN ASPART 100 UNIT/ML ~~LOC~~ SOLN
0.0000 [IU] | Freq: Every day | SUBCUTANEOUS | Status: DC
  Administered 2017-12-01: 21:00:00 3 [IU] via SUBCUTANEOUS
  Administered 2017-12-02: 23:00:00 2 [IU] via SUBCUTANEOUS
  Filled 2017-12-01 (×2): qty 1

## 2017-12-01 MED ORDER — FUROSEMIDE 40 MG PO TABS
20.0000 mg | ORAL_TABLET | Freq: Every day | ORAL | Status: DC
Start: 2017-12-01 — End: 2017-12-02
  Administered 2017-12-01 – 2017-12-02 (×2): 20 mg via ORAL
  Filled 2017-12-01 (×2): qty 1

## 2017-12-01 MED ORDER — PANTOPRAZOLE SODIUM 40 MG PO TBEC
40.0000 mg | DELAYED_RELEASE_TABLET | Freq: Every day | ORAL | Status: DC
  Administered 2017-12-01 – 2017-12-03 (×3): 40 mg via ORAL
  Filled 2017-12-01 (×3): qty 1

## 2017-12-01 MED ORDER — MOMETASONE FURO-FORMOTEROL FUM 200-5 MCG/ACT IN AERO
2.0000 | INHALATION_SPRAY | Freq: Two times a day (BID) | RESPIRATORY_TRACT | Status: DC
  Administered 2017-12-01 – 2017-12-03 (×3): 2 via RESPIRATORY_TRACT
  Filled 2017-12-01 (×4): qty 8.8

## 2017-12-01 MED ORDER — LISINOPRIL 10 MG PO TABS
40.0000 mg | ORAL_TABLET | Freq: Every day | ORAL | Status: DC
  Administered 2017-12-01 – 2017-12-03 (×3): 40 mg via ORAL
  Filled 2017-12-01 (×3): qty 4

## 2017-12-01 MED ORDER — OSELTAMIVIR PHOSPHATE 75 MG PO CAPS
75.0000 mg | ORAL_CAPSULE | Freq: Once | ORAL | Status: AC
  Administered 2017-12-01: 75 mg via ORAL
  Filled 2017-12-01: qty 1

## 2017-12-01 MED ORDER — ACETAMINOPHEN 325 MG PO TABS: 650.0000 mg | ORAL_TABLET | Freq: Four times a day (QID) | ORAL | Status: DC | PRN

## 2017-12-01 MED ORDER — DICYCLOMINE HCL 20 MG PO TABS
20.0000 mg | ORAL_TABLET | Freq: Three times a day (TID) | ORAL | Status: DC | PRN
  Administered 2017-12-02: 20 mg via ORAL
  Filled 2017-12-01 (×2): qty 1

## 2017-12-01 MED ORDER — POTASSIUM CHLORIDE IN NACL 40-0.9 MEQ/L-% IV SOLN
INTRAVENOUS | Status: DC
  Administered 2017-12-01 (×2): 75 mL/h via INTRAVENOUS
  Filled 2017-12-01 (×3): qty 1000

## 2017-12-01 MED ORDER — SODIUM CHLORIDE 0.9 % IV SOLN
500.0000 mg | Freq: Once | INTRAVENOUS | Status: AC
  Administered 2017-12-01: 500 mg via INTRAVENOUS
  Filled 2017-12-01: qty 500

## 2017-12-01 MED ORDER — BENZONATATE 100 MG PO CAPS
100.0000 mg | ORAL_CAPSULE | Freq: Three times a day (TID) | ORAL | Status: DC | PRN
  Administered 2017-12-02 (×2): 100 mg via ORAL
  Filled 2017-12-01 (×2): qty 1

## 2017-12-01 MED ORDER — SODIUM CHLORIDE 0.9 % IV SOLN
500.0000 mg | INTRAVENOUS | Status: DC
  Administered 2017-12-01: 21:00:00 500 mg via INTRAVENOUS
  Filled 2017-12-01 (×2): qty 500

## 2017-12-01 MED ORDER — HEPARIN SODIUM (PORCINE) 5000 UNIT/ML IJ SOLN
5000.0000 [IU] | Freq: Three times a day (TID) | INTRAMUSCULAR | Status: DC
  Administered 2017-12-02 – 2017-12-03 (×4): 5000 [IU] via SUBCUTANEOUS
  Filled 2017-12-01 (×4): qty 1

## 2017-12-01 MED ORDER — IPRATROPIUM-ALBUTEROL 0.5-2.5 (3) MG/3ML IN SOLN
RESPIRATORY_TRACT | Status: AC
  Filled 2017-12-01: qty 3

## 2017-12-01 MED ORDER — HEPARIN SODIUM (PORCINE) 5000 UNIT/ML IJ SOLN
5000.0000 [IU] | Freq: Three times a day (TID) | INTRAMUSCULAR | Status: DC
  Administered 2017-12-01 (×2): 5000 [IU] via SUBCUTANEOUS
  Filled 2017-12-01 (×2): qty 1

## 2017-12-01 MED ORDER — METHYLPREDNISOLONE SODIUM SUCC 125 MG IJ SOLR
60.0000 mg | Freq: Four times a day (QID) | INTRAMUSCULAR | Status: DC
  Administered 2017-12-01 – 2017-12-02 (×4): 60 mg via INTRAVENOUS
  Filled 2017-12-01 (×4): qty 2

## 2017-12-01 MED ORDER — SODIUM CHLORIDE 0.9 % IV SOLN: 1.0000 g | INTRAVENOUS | Status: DC

## 2017-12-01 MED ORDER — SODIUM CHLORIDE 0.9 % IV SOLN
1.0000 g | INTRAVENOUS | Status: DC
  Administered 2017-12-01: 21:00:00 1 g via INTRAVENOUS
  Filled 2017-12-01 (×2): qty 10

## 2017-12-01 MED ORDER — INSULIN ASPART 100 UNIT/ML ~~LOC~~ SOLN
10.0000 [IU] | Freq: Once | SUBCUTANEOUS | Status: AC
  Administered 2017-12-01: 10 [IU] via SUBCUTANEOUS
  Filled 2017-12-01: qty 1

## 2017-12-01 MED ORDER — LEVALBUTEROL HCL 1.25 MG/0.5ML IN NEBU
1.2500 mg | INHALATION_SOLUTION | Freq: Four times a day (QID) | RESPIRATORY_TRACT | Status: DC
  Administered 2017-12-01 – 2017-12-03 (×11): 1.25 mg via RESPIRATORY_TRACT
  Filled 2017-12-01 (×10): qty 0.5

## 2017-12-01 MED ORDER — POTASSIUM CHLORIDE CRYS ER 20 MEQ PO TBCR
10.0000 meq | EXTENDED_RELEASE_TABLET | Freq: Every morning | ORAL | Status: DC
  Administered 2017-12-01 – 2017-12-03 (×3): 10 meq via ORAL
  Filled 2017-12-01 (×3): qty 1

## 2017-12-01 MED ORDER — SODIUM CHLORIDE 0.9 % IV SOLN
1.0000 g | Freq: Once | INTRAVENOUS | Status: AC
  Administered 2017-12-01: 1 g via INTRAVENOUS
  Filled 2017-12-01: qty 10

## 2017-12-01 MED ORDER — SODIUM CHLORIDE 0.9 % IV SOLN: 500.0000 mg | INTRAVENOUS | Status: DC

## 2017-12-01 MED ORDER — SODIUM CHLORIDE 0.9 % IV BOLUS (SEPSIS)
1000.0000 mL | Freq: Once | INTRAVENOUS | Status: AC
Start: 2017-12-01 — End: 2017-12-01
  Administered 2017-12-01: 1000 mL via INTRAVENOUS

## 2017-12-01 MED ORDER — GUAIFENESIN ER 600 MG PO TB12
600.0000 mg | ORAL_TABLET | Freq: Two times a day (BID) | ORAL | Status: DC
  Administered 2017-12-01 – 2017-12-03 (×5): 600 mg via ORAL
  Filled 2017-12-01 (×6): qty 1

## 2017-12-01 MED ORDER — OSELTAMIVIR PHOSPHATE 30 MG PO CAPS
30.0000 mg | ORAL_CAPSULE | Freq: Every day | ORAL | Status: DC
  Administered 2017-12-01: 21:00:00 30 mg via ORAL
  Filled 2017-12-01: qty 1

## 2017-12-01 MED ORDER — IPRATROPIUM-ALBUTEROL 0.5-2.5 (3) MG/3ML IN SOLN: 3.0000 mL | Freq: Four times a day (QID) | RESPIRATORY_TRACT | Status: DC

## 2017-12-01 MED ORDER — LEVALBUTEROL HCL 1.25 MG/0.5ML IN NEBU
INHALATION_SOLUTION | RESPIRATORY_TRACT | Status: AC
  Filled 2017-12-01: qty 0.5

## 2017-12-01 NOTE — Progress Notes (Signed)
1 acute sepsis Secondary to influenza a infection and asthmatic/COPD exacerbation Continue sepsis protocol, Tamiflu, empiric Rocephin/azithromycin, follow-up on cultures, IV fluids for rehydration  2 acute influenza a Supportive care and all other plans as stated above  3 acute asthmatic/COPD exacerbation Exacerbated by above Solu-Medrol IV with tapering as tolerated, aggressive pulmonary toilet with bronchodilator therapy, supplemental oxygen, respiratory therapy to see, check sputum cultures, empiric antibiotics per above, mucolytic agents  4 acute hyponatremia/hypochloremia/hypokalemia Replete with IV fluids, p.o. 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

## 2017-12-01 NOTE — Progress Notes (Signed)
Pharmacy Antibiotic Note  Billey GoslingOdelia A Butterfield is a 77 y.o. female admitted on 11/30/2017 with pneumonia.  Pharmacy has been consulted for azithromycin/ceftriaxone dosing.  Plan: Patient received azithro 500 mg and ceftriaxone 1g IV x 1 in ED  Will continue azithromycin 500 mg and ceftriaxone 1g IV daily QTc 466 WNL  Height: 5\' 3"  (160 cm) Weight: 170 lb (77.1 kg) IBW/kg (Calculated) : 52.4  Temp (24hrs), Avg:100.7 F (38.2 C), Min:100.7 F (38.2 C), Max:100.7 F (38.2 C)  No results for input(s): WBC, CREATININE, LATICACIDVEN, VANCOTROUGH, VANCOPEAK, VANCORANDOM, GENTTROUGH, GENTPEAK, GENTRANDOM, TOBRATROUGH, TOBRAPEAK, TOBRARND, AMIKACINPEAK, AMIKACINTROU, AMIKACIN in the last 168 hours.  CrCl cannot be calculated (Patient's most recent lab result is older than the maximum 21 days allowed.).    Allergies  Allergen Reactions  . Sulfa Antibiotics Rash    Thank you for allowing pharmacy to be a part of this patient's care.  Thomasene Rippleavid Yaniel Limbaugh, PharmD, BCPS Clinical Pharmacist 12/01/2017

## 2017-12-01 NOTE — ED Notes (Signed)
Date and time results received: 12/01/17 0108 (use smartphrase ".now" to insert current time)  Test: Lactic Acid  Critical Value: 2.1  Name of Provider Notified: Dr. Lamont Snowballifenbark  Orders Received? Or Actions Taken?:

## 2017-12-01 NOTE — Progress Notes (Signed)
CH following up on several order requisitions for patient consult. Patient lost her daughter last week and her son in pestering her as she stated. CH left HCPOA information with the patient. At this time patient declines creating as she stated she is having issues with her mind due to not being able to think about anything but her loss. CH spoke with nurse asked that she page CH at a later time.

## 2017-12-01 NOTE — Progress Notes (Signed)
Chaplain discussed the nature of the OR and the patient's situation with the nurse. Patient is having difficulty breathing but wants to attend to matters because of her daughter's death a couple of days ago. Patient is grieving the loss of her daughter. Chaplain referred the OR to the incoming chaplain for a pastoral visit after 10:00.

## 2017-12-01 NOTE — Progress Notes (Signed)
CODE SEPSIS - PHARMACY COMMUNICATION  **Broad Spectrum Antibiotics should be administered within 1 hour of Sepsis diagnosis**  Time Code Sepsis Called/Page Received: 0006  Antibiotics Ordered: azithro/ceftriaxone  Time of 1st antibiotic administration: 0031  Additional action taken by pharmacy:   If necessary, Name of Provider/Nurse Contacted:     Thomasene Rippleavid  Kaydin Labo ,PharmD Clinical Pharmacist  12/01/2017  12:39 AM

## 2017-12-01 NOTE — Progress Notes (Signed)
Pt is wheezing & sob. Refuses Duoneb. States she is allergic to Albuterol but unable to provide details. Switched to Xopenex 1.25 mg. QID.

## 2017-12-01 NOTE — Plan of Care (Signed)
Patient is a new admit from the ED, came to the unit per stretcher, escorted by ED Tech. Here for SOB, Flu +  and COPD. On Oxygen therapy at 4LPM./[place]. Oriented to her room, placed on high fowlers position. Routine admission care provided, oriented to her surroundings. Alert and oriented, able to make needs known. Due medicines administered. Seen by RT, breathing treatment administered for SOB. Ativan 0.5 mg IV x 1 given for anxiety per MD order. Fall precautions in place, bed alarm on. Kept safe and comfortable. Needs attended.

## 2017-12-01 NOTE — H&P (Signed)
[facility] [place] [facility]   PATIENT NAME: Kendra Clarke    MR#:  [id number]  DATE OF BIRTH:  November 09, 1940  DATE OF ADMISSION:  11/30/2017  PRIMARY CARE PHYSICIAN: System, Pcp Not In   REQUESTING/REFERRING PHYSICIAN:   CHIEF COMPLAINT:   Chief Complaint  Patient presents with  . Cough  . Shortness of Breath  . Nasal Congestion    HISTORY OF PRESENT ILLNESS: Kendra Clarke  is a 77 y.o. female with a known history with history per below which also includes NASH, GERD, presenting from home with flulike symptoms for a week, productive cough, fevers, chills, night sweats, wheezing, in the emergency room patient was found to have increased bronchial thickening on chest x-ray concerning for COPD/asthmatic exacerbation, influenza A positive, multiple electrolyte abnormalities, noted fevers, tachypnea, tachycardia, acute kidney injury with creatinine 1.6, lactic acid 2.1, patient is now been admitted for sepsis secondary to influenza A and acute asthmatic/COPD exacerbation.  PAST MEDICAL HISTORY:   Past Medical History:  Diagnosis Date  . Arthritis   . Asthma   . COPD (chronic obstructive pulmonary disease) (HCC)   . Diabetes mellitus without complication (HCC)   . Hypertension   . Liver disease   . Umbilical hernia     PAST SURGICAL HISTORY:  Past Surgical History:  Procedure Laterality Date  . ABDOMINAL HYSTERECTOMY    . BLADDER SURGERY    . CHOLECYSTECTOMY    . nose surgery      SOCIAL HISTORY:  Social History   Tobacco Use  . Smoking status: Never Smoker  . Smokeless tobacco: Never Used  Substance Use Topics  . Alcohol use: No    FAMILY HISTORY: HTN  DRUG ALLERGIES:  Allergies  Allergen Reactions  . Sulfa Antibiotics Rash    REVIEW OF SYSTEMS:   CONSTITUTIONAL: + fever, fatigue, weakness.  EYES: No blurred or double vision.  EARS, NOSE, AND THROAT: No tinnitus or ear pain.  RESPIRATORY: + cough, shortness of breath, wheezing no  hemoptysis.  CARDIOVASCULAR: No chest pain, orthopnea, edema.  GASTROINTESTINAL: + nausea, no vomiting, diarrhea or abdominal pain.  GENITOURINARY: No dysuria, hematuria.  ENDOCRINE: No polyuria, nocturia,  HEMATOLOGY: No anemia, easy bruising or bleeding SKIN: No rash or lesion. MUSCULOSKELETAL: No joint pain or arthritis.   NEUROLOGIC: No tingling, numbness, weakness.  PSYCHIATRY: No anxiety or depression.   MEDICATIONS AT HOME:  Prior to Admission medications   Medication Sig Start Date End Date Taking? Authorizing Provider  ATROVENT HFA 17 MCG/ACT inhaler Inhale 2 puffs into the lungs every 6 (six) hours as needed.    [provider]  dicyclomine (BENTYL) 20 MG tablet Take 1 tablet (20 mg total) by mouth 3 (three) times daily as needed for spasms. 05/03/15   Jennye MoccasinQuigley, Brian S, MD  furosemide (LASIX) 20 MG tablet Take 20 mg by mouth.    [provider]  glipiZIDE (GLUCOTROL XL) 2.5 MG 24 hr tablet Take 2.5 mg by mouth daily with breakfast.    [provider]  lisinopril (PRINIVIL,ZESTRIL) 20 MG tablet Take 40 mg by mouth daily.    [provider]  metFORMIN (GLUCOPHAGE) 1000 MG tablet Take 1,000 mg by mouth 2 (two) times daily with a meal.    [provider]  metoprolol (LOPRESSOR) 50 MG tablet Take 50 mg by mouth 2 (two) times daily.    [provider]  omeprazole (PRILOSEC) 20 MG capsule Take 20 mg by mouth daily.    [provider]  potassium chloride (K-DUR,KLOR-CON) 10 MEQ tablet Take 10 mEq by mouth every morning.    [provider]  SYMBICORT 160-4.5 MCG/ACT inhaler Take 1 puff by mouth 2 (two) times daily.    [provider]      PHYSICAL EXAMINATION:   VITAL SIGNS: Blood pressure 138/70, pulse (!) 116, temperature (!) 100.7 F (38.2 C), temperature source Oral, resp. rate 17, height 5\' 3"  (1.6 m), weight 77.1 kg (170 lb), SpO2 91 %.  GENERAL:  77 y.o.-year-old patient lying in the bed with no  acute distress.  EYES: Pupils equal, round, reactive to light and accommodation.  Right eye infection, no scleral icterus. Extraocular muscles intact.  HEENT: Head atraumatic, normocephalic. Oropharynx and nasopharynx clear.  NECK:  Supple, no jugular venous distention. No thyroid enlargement, no tenderness.  LUNGS: + rhonchi b/l. No use of accessory muscles of respiration.  CARDIOVASCULAR: S1, S2 normal. No murmurs, rubs, or gallops.  ABDOMEN: Soft, nontender, nondistended. Bowel sounds present. No organomegaly or mass.  EXTREMITIES: No pedal edema, cyanosis, or clubbing.  NEUROLOGIC: Cranial nerves II through XII are intact. MAES. Gait not checked.  PSYCHIATRIC: The patient is alert and oriented x 3.  SKIN: No obvious rash, lesion, or ulcer.   LABORATORY PANEL:   CBC Recent Labs  Lab 12/01/17 0024  WBC 8.7  HGB 13.7  HCT 39.9  PLT 203  MCV 88.5  MCH 30.3  MCHC 34.2  RDW 12.7  LYMPHSABS 0.9*  MONOABS 1.3*  EOSABS 0.0  BASOSABS 0.0   ------------------------------------------------------------------------------------------------------------------  Chemistries  Recent Labs  Lab 12/01/17 0024  NA 129*  K 2.9*  CL 89*  CO2 24  GLUCOSE 287*  BUN 45*  CREATININE 1.67*  CALCIUM 7.6*  AST 71*  ALT 38  ALKPHOS 113  BILITOT 4.6*   ------------------------------------------------------------------------------------------------------------------ estimated creatinine clearance is 28.2 mL/min (A) (by C-G formula based on SCr of 1.67 mg/dL (H)). ------------------------------------------------------------------------------------------------------------------ No results for input(s): TSH, T4TOTAL, T3FREE, THYROIDAB in the last 72 hours.  Invalid input(s): FREET3   Coagulation profile Recent Labs  Lab 12/01/17 0024  INR 1.10   ------------------------------------------------------------------------------------------------------------------- No results for input(s):  DDIMER in the last 72 hours. -------------------------------------------------------------------------------------------------------------------  Cardiac Enzymes Recent Labs  Lab 12/01/17 0024  TROPONINI <0.03   ------------------------------------------------------------------------------------------------------------------ Invalid input(s): POCBNP  ---------------------------------------------------------------------------------------------------------------  Urinalysis    Component Value Date/Time   COLORURINE AMBER (A) 12/01/2017 0024   APPEARANCEUR HAZY (A) 12/01/2017 0024   LABSPEC 1.015 12/01/2017 0024   PHURINE 5.0 12/01/2017 0024   GLUCOSEU NEGATIVE 12/01/2017 0024   HGBUR SMALL (A) 12/01/2017 0024   BILIRUBINUR NEGATIVE 12/01/2017 0024   KETONESUR NEGATIVE 12/01/2017 0024   PROTEINUR 30 (A) 12/01/2017 0024   NITRITE NEGATIVE 12/01/2017 0024   LEUKOCYTESUR NEGATIVE 12/01/2017 0024     RADIOLOGY: Dg Chest Port 1 View  Result Date: 12/01/2017 CLINICAL DATA:  Shortness of breath.  Cough and congestion. EXAM: PORTABLE CHEST 1 VIEW COMPARISON:  Radiograph and CT 10/09/2013 FINDINGS: Unchanged heart size and mediastinal contours. Prominent right epicardial fat pad. Progressive perihilar peribronchial thickening. No confluent airspace disease. No pleural effusion or pneumothorax. No acute osseous abnormalities are seen. IMPRESSION: Increased bronchial thickening suggesting acute bronchitis or asthma/COPD exacerbation. Electronically Signed   By: Rubye Oaks M.D.   On: 12/01/2017 01:27    EKG: Orders placed or performed during the hospital encounter of 11/30/17  . ED EKG 12-Lead  . ED EKG 12-Lead  . EKG 12-Lead  . EKG 12-Lead    IMPRESSION AND PLAN:  1 acute sepsis Secondary to influenza a infection and asthmatic/COPD exacerbation Continue sepsis protocol, Tamiflu, empiric Rocephin/azithromycin, follow-up on cultures, IV fluids for rehydration  2 acute influenza  a Supportive care and all other plans as stated above  3 acute asthmatic/COPD exacerbation Exacerbated by above Solu-Medrol IV with tapering as tolerated, aggressive pulmonary toilet with bronchodilator therapy, supplemental oxygen, respiratory therapy to see, check sputum cultures, empiric antibiotics per above, mucolytic agents  4 acute hyponatremia/hypochloremia/hypokalemia Replete with IV fluids, p.o. 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 Management plans discussed with the patient, family and they are in agreement.  CODE STATUS:full Code Status History    This patient does not have a recorded code status. Please follow your organizational policy for patients in this situation.       TOTAL TIME TAKING CARE OF THIS PATIENT: 45 minutes.    Evelena Asa Corynn Solberg M.D on 12/01/2017   Between 7am to 6pm - Pager - [phone]  After 6pm go to [email or web address] - password [email or web address]  [facility]  Office  [phone]  CC: Primary care physician; System, Pcp Not In   Note: This dictation was prepared with Dragon dictation along with smaller phrase technology. Any transcriptional errors that result from this process are unintentional.

## 2017-12-02 DIAGNOSIS — F4321 Adjustment disorder with depressed mood: Secondary | ICD-10-CM

## 2017-12-02 DIAGNOSIS — Z634 Disappearance and death of family member: Secondary | ICD-10-CM

## 2017-12-02 LAB — URINE CULTURE: Culture: NO GROWTH

## 2017-12-02 LAB — GLUCOSE, CAPILLARY
GLUCOSE-CAPILLARY: 343 mg/dL — AB (ref 65–99)
GLUCOSE-CAPILLARY: 112 mg/dL — AB (ref 65–99)
Glucose-Capillary: 422 mg/dL — ABNORMAL HIGH (ref 65–99)
Glucose-Capillary: 215 mg/dL — ABNORMAL HIGH (ref 65–99)

## 2017-12-02 LAB — BASIC METABOLIC PANEL
Anion gap: 13 (ref 5–15)
BUN: 31 mg/dL — AB (ref 6–20)
CO2: 20 mmol/L — ABNORMAL LOW (ref 22–32)
CREATININE: 0.93 mg/dL (ref 0.44–1.00)
Calcium: 7.1 mg/dL — ABNORMAL LOW (ref 8.9–10.3)
Chloride: 98 mmol/L — ABNORMAL LOW (ref 101–111)
GFR calc Af Amer: >60 mL/min (ref >60)
GFR calc non Af Amer: 58 mL/min — ABNORMAL LOW (ref >60)
GLUCOSE: 366 mg/dL — AB (ref 65–99)
Potassium: 3.7 mmol/L (ref 3.5–5.1)
SODIUM: 131 mmol/L — AB (ref 135–145)

## 2017-12-02 LAB — BLOOD CULTURE ID PANEL (REFLEXED)
ACINETOBACTER BAUMANNII: NOT DETECTED
CANDIDA ALBICANS: NOT DETECTED
CANDIDA GLABRATA: NOT DETECTED
CANDIDA PARAPSILOSIS: NOT DETECTED
CANDIDA TROPICALIS: NOT DETECTED
Candida krusei: NOT DETECTED
ENTEROBACTER CLOACAE COMPLEX: NOT DETECTED
Enterobacteriaceae species: NOT DETECTED
Enterococcus species: NOT DETECTED
Escherichia coli: NOT DETECTED
HAEMOPHILUS INFLUENZAE: DETECTED — AB
KLEBSIELLA OXYTOCA: NOT DETECTED
KLEBSIELLA PNEUMONIAE: NOT DETECTED
Listeria monocytogenes: NOT DETECTED
NEISSERIA MENINGITIDIS: NOT DETECTED
Proteus species: NOT DETECTED
Pseudomonas aeruginosa: NOT DETECTED
STAPHYLOCOCCUS SPECIES: NOT DETECTED
STREPTOCOCCUS AGALACTIAE: NOT DETECTED
Serratia marcescens: NOT DETECTED
Staphylococcus aureus (BCID): NOT DETECTED
Streptococcus pneumoniae: NOT DETECTED
Streptococcus pyogenes: NOT DETECTED
Streptococcus species: NOT DETECTED

## 2017-12-02 LAB — C DIFFICILE QUICK SCREEN W PCR REFLEX
C DIFFICILE (CDIFF) INTERP: NOT DETECTED
C Diff antigen: NEGATIVE
C Diff toxin: NEGATIVE

## 2017-12-02 MED ORDER — OSELTAMIVIR PHOSPHATE 30 MG PO CAPS
30.0000 mg | ORAL_CAPSULE | Freq: Two times a day (BID) | ORAL | Status: DC
  Administered 2017-12-02 – 2017-12-03 (×2): 30 mg via ORAL
  Filled 2017-12-02 (×3): qty 1

## 2017-12-02 MED ORDER — INSULIN GLARGINE 100 UNIT/ML ~~LOC~~ SOLN
20.0000 [IU] | Freq: Every day | SUBCUTANEOUS | Status: DC
  Administered 2017-12-02: 20 [IU] via SUBCUTANEOUS
  Filled 2017-12-02 (×2): qty 0.2

## 2017-12-02 MED ORDER — SODIUM CHLORIDE 0.9 % IV SOLN
INTRAVENOUS | Status: DC
  Administered 2017-12-02: 14:00:00 via INTRAVENOUS

## 2017-12-02 MED ORDER — FLORANEX PO PACK
1.0000 g | PACK | Freq: Three times a day (TID) | ORAL | Status: DC
  Administered 2017-12-02 – 2017-12-03 (×4): 1 g via ORAL
  Filled 2017-12-02 (×6): qty 1

## 2017-12-02 MED ORDER — PREDNISONE 50 MG PO TABS
50.0000 mg | ORAL_TABLET | Freq: Every day | ORAL | Status: DC
  Administered 2017-12-02: 50 mg via ORAL
  Filled 2017-12-02: qty 1

## 2017-12-02 MED ORDER — LOPERAMIDE HCL 2 MG PO CAPS
2.0000 mg | ORAL_CAPSULE | ORAL | Status: DC | PRN
  Administered 2017-12-02 (×2): 2 mg via ORAL
  Filled 2017-12-02 (×2): qty 1

## 2017-12-02 MED ORDER — INSULIN GLARGINE 100 UNIT/ML ~~LOC~~ SOLN
20.0000 [IU] | SUBCUTANEOUS | Status: AC
  Administered 2017-12-02: 20 [IU] via SUBCUTANEOUS
  Filled 2017-12-02: qty 0.2

## 2017-12-02 MED ORDER — METFORMIN HCL 500 MG PO TABS
500.0000 mg | ORAL_TABLET | Freq: Two times a day (BID) | ORAL | Status: DC
  Administered 2017-12-02: 17:00:00 500 mg via ORAL
  Filled 2017-12-02 (×3): qty 1

## 2017-12-02 MED ORDER — AMOXICILLIN-POT CLAVULANATE 875-125 MG PO TABS
1.0000 | ORAL_TABLET | Freq: Two times a day (BID) | ORAL | Status: DC
  Administered 2017-12-02 – 2017-12-03 (×2): 1 via ORAL
  Filled 2017-12-02 (×2): qty 1

## 2017-12-02 MED ORDER — PREMIER PROTEIN SHAKE
11.0000 [oz_av] | Freq: Two times a day (BID) | ORAL | Status: DC
  Administered 2017-12-03 (×2): 11 [oz_av] via ORAL

## 2017-12-02 MED ORDER — ADULT MULTIVITAMIN W/MINERALS CH
1.0000 | ORAL_TABLET | Freq: Every day | ORAL | Status: DC
  Administered 2017-12-03: 08:00:00 1 via ORAL
  Filled 2017-12-02: qty 1

## 2017-12-02 MED ORDER — INSULIN ASPART 100 UNIT/ML ~~LOC~~ SOLN
35.0000 [IU] | Freq: Once | SUBCUTANEOUS | Status: AC
  Administered 2017-12-02: 12:00:00 35 [IU] via SUBCUTANEOUS
  Filled 2017-12-02: qty 1

## 2017-12-02 NOTE — Progress Notes (Signed)
tamiflu dose increased from 30mg  daily to 30mg  BID due to improvement of crcl to 6148ml/min  Melissa D Maccia, Pharm.D, BCPS Clinical Pharmacist

## 2017-12-02 NOTE — Consult Note (Signed)
[facility] Face-to-Face Psychiatry Consult   Reason for Consult: Consult for this 77 year old woman currently in the hospital with pneumonia.  Question regarding grief. Referring Physician: Salary Patient Identification: Kendra Clarke MRN:  [id number] Principal Diagnosis: Grief at loss of child Diagnosis:   Patient Active Problem List   Diagnosis Date Noted  . Grief at loss of child [F43.21, Z63.4] 12/02/2017  . Sepsis ([place]) [A41.9] 12/01/2017    Total Time spent with patient: 1 hour  Subjective:   Kendra Clarke is a 77 y.o. female patient admitted with "it is pneumonia and the flu".  HPI: Patient interviewed.  Chart reviewed.  77 year old woman in the hospital with respiratory distress from infection with possible flu and pneumonia.  Patient has reported that her daughter just died 2 days ago.  Patient confirms that to me.  Says that her daughter had COPD and other medical problems and seems to have died just a couple days ago.  Patient apparently has not gotten a chance to see her.  Patient expresses appropriate sadness and distress over this.  Patient nevertheless denies any suicidal thoughts of her own at all.  Says that she has had some trouble sleeping at night.  Appetite a little bit down.  Patient indicates however that she is trying to get better.  She talks about making funeral preparations and continuing to take care of her family at home.  Patient does admit that she was having visual hallucinations earlier today.  She has insight into this.  She is not aware of having any right now.  Social history: Patient lives with her sister who has schizophrenia.  When the patient is well she sees herself as a caretaker for much of the family.  Medical history: Currently suffering from respiratory infection.  Substance abuse history: Denies any  Past Psychiatric History: Patient has never seen a psychiatrist or been in a mental health Hospital.  Does not recall having been prescribed any  medicine for depression or mental health problems in the past.  No history of suicide attempts or violence.  Risk to Self: Is patient at risk for suicide?: No Risk to Others:   Prior Inpatient Therapy:   Prior Outpatient Therapy:    Past Medical History:  Past Medical History:  Diagnosis Date  . Arthritis   . Asthma   . COPD (chronic obstructive pulmonary disease) ([place])   . Diabetes mellitus without complication ([place])   . Hypertension   . Liver disease   . Umbilical hernia     Past Surgical History:  Procedure Laterality Date  . ABDOMINAL HYSTERECTOMY    . BLADDER SURGERY    . CHOLECYSTECTOMY    . nose surgery     Family History: History reviewed. No pertinent family history. Family Psychiatric  History: Has a younger sister with schizophrenia. Social History:  Social History   Substance and Sexual Activity  Alcohol Use No     Social History   Substance and Sexual Activity  Drug Use No    Social History   Socioeconomic History  . Marital status: Widowed    Spouse name: None  . Number of children: None  . Years of education: None  . Highest education level: None  Social Needs  . Financial resource strain: None  . Food insecurity - worry: None  . Food insecurity - inability: None  . Transportation needs - medical: None  . Transportation needs - non-medical: None  Occupational History  . None  Tobacco Use  .  Smoking status: Never Smoker  . Smokeless tobacco: Never Used  Substance and Sexual Activity  . Alcohol use: No  . Drug use: No  . Sexual activity: None  Other Topics Concern  . None  Social History Narrative  . None   Additional Social History:    Allergies:   Allergies  Allergen Reactions  . Codeine Anxiety  . Sulfa Antibiotics Rash    Labs:  Results for orders placed or performed during the hospital encounter of 11/30/17 (from the past 48 hour(s))  Lactic acid, plasma     Status: Abnormal   Collection Time: 12/01/17 12:24 AM  Result  Value Ref Range   Lactic Acid, Venous 2.1 (HH) 0.5 - 1.9 mmol/L    Comment: CRITICAL RESULT CALLED TO, READ BACK BY AND VERIFIED WITH KALA KEEN AT 0107 ON 12/01/17 RWW Performed at [facility], [address] [place], [place] [address]   Comprehensive metabolic panel     Status: Abnormal   Collection Time: 12/01/17 12:24 AM  Result Value Ref Range   Sodium 129 (L) 135 - 145 mmol/L   Potassium 2.9 (L) 3.5 - 5.1 mmol/L   Chloride 89 (L) 101 - 111 mmol/L   CO2 24 22 - 32 mmol/L   Glucose, Bld 287 (H) 65 - 99 mg/dL   BUN 45 (H) 6 - 20 mg/dL   Creatinine, Ser 1.67 (H) 0.44 - 1.00 mg/dL   Calcium 7.6 (L) 8.9 - 10.3 mg/dL   Total Protein 7.6 6.5 - 8.1 g/dL   Albumin 2.9 (L) 3.5 - 5.0 g/dL   AST 71 (H) 15 - 41 U/L   ALT 38 14 - 54 U/L   Alkaline Phosphatase 113 38 - 126 U/L   Total Bilirubin 4.6 (H) 0.3 - 1.2 mg/dL   GFR calc non Af Amer 29 (L) >60 mL/min   GFR calc Af Amer 33 (L) >60 mL/min    Comment: (NOTE) The eGFR has been calculated using the CKD EPI equation. This calculation has not been validated in all clinical situations. eGFR's persistently <60 mL/min signify possible Chronic Kidney Disease.    Anion gap 16 (H) 5 - 15    Comment: Performed at [facility], [address] [place], [place] [address]  Lipase, blood     Status: None   Collection Time: 12/01/17 12:24 AM  Result Value Ref Range   Lipase 27 11 - 51 U/L    Comment: Performed at [facility], [address] [place], [place] [address]  Troponin I     Status: None   Collection Time: 12/01/17 12:24 AM  Result Value Ref Range   Troponin I <0.03 <0.03 ng/mL    Comment: Performed at [facility], [address] [place], [place] [address]  CBC WITH DIFFERENTIAL     Status: Abnormal   Collection Time: 12/01/17 12:24 AM  Result Value Ref Range   WBC 8.7 3.6 - 11.0 K/uL   RBC 4.50 3.80 - 5.20 MIL/uL   Hemoglobin 13.7 12.0 - 16.0 g/dL   HCT 39.9 35.0 - 47.0 %   MCV 88.5  80.0 - 100.0 fL   MCH 30.3 26.0 - 34.0 pg   MCHC 34.2 32.0 - 36.0 g/dL   RDW 12.7 11.5 - 14.5 %   Platelets 203 150 - 440 K/uL   Neutrophils Relative % 74 %   Neutro Abs 6.4 1.4 - 6.5 K/uL   Lymphocytes Relative 11 %   Lymphs Abs 0.9 (L)  1.0 - 3.6 K/uL   Monocytes Relative 15 %   Monocytes Absolute 1.3 (H) 0.2 - 0.9 K/uL   Eosinophils Relative 0 %   Eosinophils Absolute 0.0 0 - 0.7 K/uL   Basophils Relative 0 %   Basophils Absolute 0.0 0 - 0.1 K/uL    Comment: Performed at [facility], [address] [place], [place] [address]  Procalcitonin     Status: None   Collection Time: 12/01/17 12:24 AM  Result Value Ref Range   Procalcitonin 7.10 ng/mL    Comment:        Interpretation: PCT > 2 ng/mL: Systemic infection (sepsis) is likely, unless other causes are known. (NOTE)       Sepsis PCT Algorithm           Lower Respiratory Tract                                      Infection PCT Algorithm    ----------------------------     ----------------------------         PCT < 0.25 ng/mL                PCT < 0.10 ng/mL         Strongly encourage             Strongly discourage   discontinuation of antibiotics    initiation of antibiotics    ----------------------------     -----------------------------       PCT 0.25 - 0.50 ng/mL            PCT 0.10 - 0.25 ng/mL               OR       >80% decrease in PCT            Discourage initiation of                                            antibiotics      Encourage discontinuation           of antibiotics    ----------------------------     -----------------------------         PCT >= 0.50 ng/mL              PCT 0.26 - 0.50 ng/mL               AND       <80% decrease in PCT              Encourage initiation of                                             antibiotics       Encourage continuation           of antibiotics    ----------------------------     -----------------------------        PCT >= 0.50 ng/mL                   PCT > 0.50 ng/mL               AND  increase in PCT                  Strongly encourage                                      initiation of antibiotics    Strongly encourage escalation           of antibiotics                                     -----------------------------                                           PCT <= 0.25 ng/mL                                                 OR                                        > 80% decrease in PCT                                     Discontinue / Do not initiate                                             antibiotics Performed at [facility], [address] [place], [place] [address]   Protime-INR     Status: None   Collection Time: 12/01/17 12:24 AM  Result Value Ref Range   Prothrombin Time 14.1 11.4 - 15.2 seconds   INR 1.10     Comment: Performed at [facility], [address], [place], [place] [address]  Blood Culture (routine x 2)     Status: None (Preliminary result)   Collection Time: 12/01/17 12:24 AM  Result Value Ref Range   Specimen Description BLOOD RIGHT ANTECUBITAL    Special Requests      BOTTLES DRAWN AEROBIC AND ANAEROBIC Blood Culture results may not be optimal due to an excessive volume of blood received in culture bottles   Culture  Setup Time      Organism ID to follow AEROBIC BOTTLE ONLY GRAM NEGATIVE COCCOBACILLI CRITICAL RESULT CALLED TO, READ BACK BY AND VERIFIED WITH: CHRISTINE KATSOUDAS ON 12/02/17 AT [facility] Performed at [facility], [address], [place], [place] [address]    Culture GRAM NEGATIVE COCCOBACILLI    Report Status PENDING   Blood Culture (routine x 2)     Status: None (Preliminary result)   Collection Time: 12/01/17 12:24 AM  Result Value Ref Range   Specimen Description BLOOD LEFT WRIST    Special Requests      BOTTLES DRAWN AEROBIC AND ANAEROBIC Blood Culture results may not be optimal due to an excessive volume of blood received in culture  bottles   Culture      NO GROWTH 1 DAY Performed at [facility], [address] [place], [place] [address]    Report Status PENDING   Urinalysis, Routine w reflex microscopic     Status: Abnormal   Collection Time: 12/01/17 12:24 AM  Result Value Ref Range   Color, Urine AMBER (A) YELLOW    Comment: BIOCHEMICALS MAY BE AFFECTED BY COLOR   APPearance HAZY (A) CLEAR   Specific Gravity, Urine 1.015 1.005 - 1.030   pH 5.0 5.0 - 8.0   Glucose, UA NEGATIVE NEGATIVE mg/dL   Hgb urine dipstick SMALL (A) NEGATIVE   Bilirubin Urine NEGATIVE NEGATIVE   Ketones, ur NEGATIVE NEGATIVE mg/dL   Protein, ur 30 (A) NEGATIVE mg/dL   Nitrite NEGATIVE NEGATIVE   Leukocytes, UA NEGATIVE NEGATIVE   RBC / HPF NONE SEEN 0 - 5 RBC/hpf   WBC, UA 0-5 0 - 5 WBC/hpf   Bacteria, UA MANY (A) NONE SEEN   Squamous Epithelial / LPF 0-5 (A) NONE SEEN   WBC Clumps PRESENT    Mucus PRESENT    Hyaline Casts, UA PRESENT     Comment: Performed at [facility], [address], [place], [place] [address]  Urine culture     Status: None   Collection Time: 12/01/17 12:24 AM  Result Value Ref Range   Specimen Description      URINE, RANDOM Performed at [facility], [address], [place], [place] [address]    Special Requests      NONE Performed at [facility], [address] [place], [place] [address]    Culture      NO GROWTH Performed at [facility], [place] [address] [place], [place] [address]    Report Status 12/02/2017 FINAL   Influenza panel by PCR (type A & B)     Status: Abnormal   Collection Time: 12/01/17 12:24 AM  Result Value Ref Range   Influenza A By PCR POSITIVE (A) NEGATIVE   Influenza B By PCR NEGATIVE NEGATIVE    Comment: (NOTE) The Xpert Xpress Flu assay is intended as an aid in the diagnosis of  influenza and should not be used as a sole basis for treatment.  This  assay is FDA approved for nasopharyngeal swab specimens only. Nasal   washings and aspirates are unacceptable for Xpert Xpress Flu testing. Performed at [facility], [address] [place], [place] [address]   Strep pneumoniae urinary antigen     Status: None   Collection Time: 12/01/17 12:24 AM  Result Value Ref Range   Strep Pneumo Urinary Antigen NEGATIVE NEGATIVE    Comment:        Infection due to S. pneumoniae cannot be absolutely ruled out since the antigen present may be below the detection limit of the test. Performed at [facility], [place] [address] [place], [place] [address]   Blood Culture ID Panel (Reflexed)     Status: Abnormal   Collection Time: 12/01/17 12:24 AM  Result Value Ref Range   Enterococcus species NOT DETECTED NOT DETECTED   Listeria monocytogenes NOT DETECTED NOT DETECTED   Staphylococcus species NOT DETECTED NOT DETECTED   Staphylococcus aureus NOT DETECTED NOT DETECTED   Streptococcus species NOT DETECTED NOT DETECTED   Streptococcus agalactiae NOT DETECTED NOT DETECTED   Streptococcus pneumoniae NOT DETECTED NOT DETECTED   Streptococcus pyogenes NOT DETECTED NOT DETECTED   Acinetobacter baumannii NOT DETECTED  NOT DETECTED   Enterobacteriaceae species NOT DETECTED NOT DETECTED   Enterobacter cloacae complex NOT DETECTED NOT DETECTED   Escherichia coli NOT DETECTED NOT DETECTED   Klebsiella oxytoca NOT DETECTED NOT DETECTED   Klebsiella pneumoniae NOT DETECTED NOT DETECTED   Proteus species NOT DETECTED NOT DETECTED   Serratia marcescens NOT DETECTED NOT DETECTED   Haemophilus influenzae DETECTED (A) NOT DETECTED    Comment: CRITICAL RESULT CALLED TO, READ BACK BY AND VERIFIED WITH: Naomi ON 12/02/17 AT 1649 [place]    Neisseria meningitidis NOT DETECTED NOT DETECTED   Pseudomonas aeruginosa NOT DETECTED NOT DETECTED   Candida albicans NOT DETECTED NOT DETECTED   Candida glabrata NOT DETECTED NOT DETECTED   Candida krusei NOT DETECTED NOT DETECTED   Candida parapsilosis NOT DETECTED  NOT DETECTED   Candida tropicalis NOT DETECTED NOT DETECTED    Comment: Performed at [facility], [address] [place], [place] [address]  Glucose, capillary     Status: Abnormal   Collection Time: 12/01/17  3:02 AM  Result Value Ref Range   Glucose-Capillary 271 (H) 65 - 99 mg/dL  Lactic acid, plasma     Status: None   Collection Time: 12/01/17  5:17 AM  Result Value Ref Range   Lactic Acid, Venous 1.3 0.5 - 1.9 mmol/L    Comment: Performed at [facility], [address] [place], [place] [address]  Glucose, capillary     Status: Abnormal   Collection Time: 12/01/17  5:44 AM  Result Value Ref Range   Glucose-Capillary 260 (H) 65 - 99 mg/dL  Glucose, capillary     Status: Abnormal   Collection Time: 12/01/17  7:41 AM  Result Value Ref Range   Glucose-Capillary 274 (H) 65 - 99 mg/dL  Glucose, capillary     Status: Abnormal   Collection Time: 12/01/17 11:45 AM  Result Value Ref Range   Glucose-Capillary 369 (H) 65 - 99 mg/dL  Culture, sputum-assessment     Status: None   Collection Time: 12/01/17 12:54 PM  Result Value Ref Range   Specimen Description SPUTUM    Special Requests NONE    Sputum evaluation      Sputum specimen not acceptable for testing.  Please recollect.   MEGAN OAKLEY ON 12/01/17 AT 1346 Performed at [facility], [address] [place], [place] [address]    Report Status 12/01/2017 FINAL   Glucose, capillary     Status: Abnormal   Collection Time: 12/01/17  5:10 PM  Result Value Ref Range   Glucose-Capillary 270 (H) 65 - 99 mg/dL  Glucose, capillary     Status: Abnormal   Collection Time: 12/01/17  9:04 PM  Result Value Ref Range   Glucose-Capillary 286 (H) 65 - 99 mg/dL  C difficile quick scan w PCR reflex     Status: None   Collection Time: 12/02/17  6:02 AM  Result Value Ref Range   C Diff antigen NEGATIVE NEGATIVE   C Diff toxin NEGATIVE NEGATIVE   C Diff interpretation No C. difficile detected.     Comment:  NEGATIVE Performed at [facility], [address] [place], [place] [address]   Glucose, capillary     Status: Abnormal   Collection Time: 12/02/17  7:39 AM  Result Value Ref Range   Glucose-Capillary 343 (H) 65 - 99 mg/dL  Basic metabolic panel     Status: Abnormal   Collection Time: 12/02/17  9:16 AM  Result Value Ref Range  Sodium 131 (L) 135 - 145 mmol/L   Potassium 3.7 3.5 - 5.1 mmol/L   Chloride 98 (L) 101 - 111 mmol/L   CO2 20 (L) 22 - 32 mmol/L   Glucose, Bld 366 (H) 65 - 99 mg/dL   BUN 31 (H) 6 - 20 mg/dL   Creatinine, Ser 0.93 0.44 - 1.00 mg/dL   Calcium 7.1 (L) 8.9 - 10.3 mg/dL   GFR calc non Af Amer 58 (L) >60 mL/min   GFR calc Af Amer >60 >60 mL/min    Comment: (NOTE) The eGFR has been calculated using the CKD EPI equation. This calculation has not been validated in all clinical situations. eGFR's persistently <60 mL/min signify possible Chronic Kidney Disease.    Anion gap 13 5 - 15    Comment: Performed at [facility], [address] [place], [place] [address]  Glucose, capillary     Status: Abnormal   Collection Time: 12/02/17 11:55 AM  Result Value Ref Range   Glucose-Capillary 422 (H) 65 - 99 mg/dL  Glucose, capillary     Status: Abnormal   Collection Time: 12/02/17  4:59 PM  Result Value Ref Range   Glucose-Capillary 112 (H) 65 - 99 mg/dL    Current Facility-Administered Medications  Medication Dose Route Frequency Provider Last Rate Last Dose  . acetaminophen (TYLENOL) tablet 650 mg  650 mg Oral Q6H PRN Salary, Montell D, MD      . amoxicillin-clavulanate (AUGMENTIN) 875-125 MG per tablet 1 tablet  1 tablet Oral Q12H Salary, Montell D, MD      . benzonatate (TESSALON) capsule 100 mg  100 mg Oral TID PRN Loney Hering D, MD   100 mg at 12/02/17 1724  . dicyclomine (BENTYL) tablet 20 mg  20 mg Oral TID PRN Loney Hering D, MD   20 mg at 12/02/17 0942  . erythromycin ophthalmic ointment   Right Eye Q6H Salary, Montell D, MD       . glipiZIDE (GLUCOTROL XL) 24 hr tablet 2.5 mg  2.5 mg Oral Q breakfast Salary, Montell D, MD   2.5 mg at 12/02/17 0942  . guaiFENesin (MUCINEX) 12 hr tablet 600 mg  600 mg Oral BID Salary, Montell D, MD   600 mg at 12/02/17 0941  . heparin injection 5,000 Units  5,000 Units Subcutaneous Q8H Candelaria Stagers, RPH   5,000 Units at 12/02/17 1415  . hydrALAZINE (APRESOLINE) injection 10 mg  10 mg Intravenous Q4H PRN Salary, Montell D, MD      . insulin aspart (novoLOG) injection 0-20 Units  0-20 Units Subcutaneous TID WC Gorden Harms, MD   15 Units at 12/02/17 0940  . insulin aspart (novoLOG) injection 0-5 Units  0-5 Units Subcutaneous QHS Loney Hering D, MD   3 Units at 12/01/17 2108  . insulin glargine (LANTUS) injection 20 Units  20 Units Subcutaneous QHS Salary, Montell D, MD      . lactobacillus (FLORANEX/LACTINEX) granules 1 g  1 g Oral TID WC Salary, Montell D, MD   1 g at 12/02/17 1724  . levalbuterol (XOPENEX) nebulizer solution 1.25 mg  1.25 mg Nebulization QID Salary, Holly Bodily D, MD   1.25 mg at 12/02/17 2019  . lisinopril (PRINIVIL,ZESTRIL) tablet 40 mg  40 mg Oral Daily Salary, Montell D, MD   40 mg at 12/02/17 0941  . loperamide (IMODIUM) capsule 2 mg  2 mg Oral PRN Salary, Holly Bodily D, MD   2 mg at 12/02/17 0941  . MEDLINE mouth rinse  15 mL Mouth Rinse BID Salary, Montell D, MD   15 mL at 12/02/17 0941  . metFORMIN (GLUCOPHAGE) tablet 500 mg  500 mg Oral BID WC Salary, Montell D, MD   500 mg at 12/02/17 1724  . metoprolol tartrate (LOPRESSOR) tablet 50 mg  50 mg Oral BID Loney Hering D, MD   50 mg at 12/02/17 0941  . mometasone-formoterol (DULERA) 200-5 MCG/ACT inhaler 2 puff  2 puff Inhalation BID Loney Hering D, MD   2 puff at 12/01/17 2034  . [START ON 12/03/2017] multivitamin with minerals tablet 1 tablet  1 tablet Oral Daily Salary, Montell D, MD      . oseltamivir (TAMIFLU) capsule 30 mg  30 mg Oral BID Ramond Dial, RPH      . pantoprazole (PROTONIX) EC tablet  40 mg  40 mg Oral Daily Salary, Montell D, MD   40 mg at 12/02/17 0941  . potassium chloride SA (K-DUR,KLOR-CON) CR tablet 10 mEq  10 mEq Oral q morning - 10a Salary, Montell D, MD   10 mEq at 12/02/17 0942  . predniSONE (DELTASONE) tablet 50 mg  50 mg Oral Q breakfast Salary, Montell D, MD   50 mg at 12/02/17 0940  . [START ON 12/03/2017] protein supplement (PREMIER PROTEIN) liquid  11 oz Oral BID BM Salary, Montell D, MD      . traZODone (DESYREL) tablet 50 mg  50 mg Oral QHS PRN Salary, Avel Peace, MD   50 mg at 12/01/17 2033    Musculoskeletal: Strength & Muscle Tone: decreased Gait & Station: unable to stand Patient leans: N/A  Psychiatric Specialty Exam: Physical Exam  Nursing note and vitals reviewed. Constitutional: She appears well-developed and well-nourished.  HENT:  Head: Normocephalic and atraumatic.  Eyes: Conjunctivae are normal. Pupils are equal, round, and reactive to light.  Neck: Normal range of motion.  Cardiovascular: Regular rhythm and normal heart sounds.  Respiratory: She is in respiratory distress.  GI: Soft.  Musculoskeletal: Normal range of motion.  Neurological: She is alert.  Skin: Skin is warm and dry.  Psychiatric: She has a normal mood and affect. Her speech is normal and behavior is normal. Judgment and thought content normal. Cognition and memory are normal.    Review of Systems  Constitutional: Negative.   HENT: Negative.   Eyes: Negative.   Respiratory: Positive for shortness of breath.   Cardiovascular: Negative.   Gastrointestinal: Negative.   Musculoskeletal: Negative.   Skin: Negative.   Neurological: Negative.   Psychiatric/Behavioral: Positive for depression and hallucinations. Negative for memory loss, substance abuse and suicidal ideas. The patient has insomnia. The patient is not nervous/anxious.     Blood pressure 135/70, pulse 79, temperature 98.6 F (37 C), temperature source Oral, resp. rate 20, height _0  (1.575 m), weight  74.9 kg (165 lb 3.2 oz), SpO2 98 %.Body mass index is 30.22 kg/m.  General Appearance: Casual  Eye Contact:  Fair  Speech:  Clear and Coherent  Volume:  Normal  Mood:  Dysphoric  Affect:  Appropriate  Thought Process:  Coherent  Orientation:  Full (Time, Place, and Person)  Thought Content:  Logical  Suicidal Thoughts:  No  Homicidal Thoughts:  No  Memory:  Immediate;   Fair Recent;   Fair Remote;   Fair  Judgement:  Fair  Insight:  Fair  Psychomotor Activity:  Decreased  Concentration:  Concentration: Fair  Recall:  [employer] of Knowledge:  Fair  Language:  Fair  Akathisia:  No  Handed:  Right  AIMS (if indicated):     Assets:  Desire for Improvement Housing Resilience  ADL's:  Impaired  Cognition:  Impaired,  Mild  Sleep:        Treatment Plan Summary: Plan 77 year old woman with acute and appropriate grief.  No sign at this point of anything inappropriate about her grief response.  Patient is cooperative with medical care.  Patient is not expressing suicidality.  Visual hallucinations earlier are not likely to be an expression of mental health problems.  No indication for any psychiatric medicine.  Supportive counseling done.  Follow-up as needed.  Disposition: No evidence of imminent risk to self or others at present.   Patient does not meet criteria for psychiatric inpatient admission. Supportive therapy provided about ongoing stressors.  Alethia Berthold, MD 12/02/2017 8:51 PM

## 2017-12-02 NOTE — Progress Notes (Signed)
[employer] - [place] [facility]   PATIENT NAME: Kendra Clarke    MR#:  [id number]  DATE OF BIRTH:  [birth year]  SUBJECTIVE:  CHIEF COMPLAINT:   Chief Complaint  Patient presents with  . Cough  . Shortness of Breath  . Nasal Congestion  In discussion with nursing staff and on evaluation this morning, patient is clinically depressed given recent death of daughter, also with multiple episodes of loose stool-C. difficile negative  REVIEW OF SYSTEMS:  CONSTITUTIONAL: No fever, fatigue or weakness.  EYES: No blurred or double vision.  EARS, NOSE, AND THROAT: No tinnitus or ear pain.  RESPIRATORY: No cough, shortness of breath, wheezing or hemoptysis.  CARDIOVASCULAR: No chest pain, orthopnea, edema.  GASTROINTESTINAL: No nausea, vomiting, diarrhea or abdominal pain.  GENITOURINARY: No dysuria, hematuria.  ENDOCRINE: No polyuria, nocturia,  HEMATOLOGY: No anemia, easy bruising or bleeding SKIN: No rash or lesion. MUSCULOSKELETAL: No joint pain or arthritis.   NEUROLOGIC: No tingling, numbness, weakness.  PSYCHIATRY: No anxiety or depression.   ROS  DRUG ALLERGIES:   Allergies  Allergen Reactions  . Codeine Anxiety  . Sulfa Antibiotics Rash    VITALS:  Blood pressure 135/70, pulse 79, temperature 98.6 F (37 C), temperature source Oral, resp. rate 20, height 5\' 2"  (1.575 m), weight 74.9 kg (165 lb 3.2 oz), SpO2 95 %.  PHYSICAL EXAMINATION:  GENERAL:  77 y.o.-year-old patient lying in the bed with no acute distress.  EYES: Pupils equal, round, reactive to light and accommodation. No scleral icterus. Extraocular muscles intact.  HEENT: Head atraumatic, normocephalic. Oropharynx and nasopharynx clear.  NECK:  Supple, no jugular venous distention. No thyroid enlargement, no tenderness.  LUNGS: Normal breath sounds bilaterally, no wheezing, rales,rhonchi or crepitation. No use of accessory muscles of respiration.  CARDIOVASCULAR: S1, S2 normal. No murmurs,  rubs, or gallops.  ABDOMEN: Soft, nontender, nondistended. Bowel sounds present. No organomegaly or mass.  EXTREMITIES: No pedal edema, cyanosis, or clubbing.  NEUROLOGIC: Cranial nerves II through XII are intact. Muscle strength 5/5 in all extremities. Sensation intact. Gait not checked.  PSYCHIATRIC: The patient is alert and oriented x 3.  SKIN: No obvious rash, lesion, or ulcer.   Physical Exam LABORATORY PANEL:   CBC Recent Labs  Lab 12/01/17 0024  WBC 8.7  HGB 13.7  HCT 39.9  PLT 203   ------------------------------------------------------------------------------------------------------------------  Chemistries  Recent Labs  Lab 12/01/17 0024 12/02/17 0916  NA 129* 131*  K 2.9* 3.7  CL 89* 98*  CO2 24 20*  GLUCOSE 287* 366*  BUN 45* 31*  CREATININE 1.67* 0.93  CALCIUM 7.6* 7.1*  AST 71*  --   ALT 38  --   ALKPHOS 113  --   BILITOT 4.6*  --    ------------------------------------------------------------------------------------------------------------------  Cardiac Enzymes Recent Labs  Lab 12/01/17 0024  TROPONINI <0.03   ------------------------------------------------------------------------------------------------------------------  RADIOLOGY:  Ct Chest Wo Contrast  Result Date: 12/01/2017 CLINICAL DATA:  Acute resp illness, > 14 years old. Cough and congestion. EXAM: CT CHEST WITHOUT CONTRAST TECHNIQUE: Multidetector CT imaging of the chest was performed following the standard protocol without IV contrast. COMPARISON:  Chest radiograph earlier this day. FINDINGS: Cardiovascular: Aortic atherosclerosis. No aneurysm. Borderline cardiomegaly. There are coronary artery calcifications. No pericardial effusion. Mediastinum/Nodes: Prominent prevascular and lower paratracheal nodes measuring up to 8 mm. Limited assessment for hilar adenopathy given lack of IV contrast. The esophagus is decompressed. Ill-defined low-density nodule in the right lobe of the thyroid  gland measures at least 15  mm, with peripheral calcification. Lungs/Pleura: Moderate breathing motion artifact. Multifocal ground-glass, tree in bud, and consolidative opacities throughout all lobes of both lungs. Some of these opacities have a nodular appearance. Moderate to severe central bronchial thickening, most prominent in the lower lobes with scattered bronchial occlusion/mucous plugging. Calcified granuloma in the right lower lobe. No pleural effusion. Upper Abdomen: Breathing motion artifact. Nodular hepatic contours suggests cirrhosis. Prominent spleen is partially included. Musculoskeletal: Moderate degenerative change in the spine. There are no acute or suspicious osseous abnormalities. IMPRESSION: 1. Multifocal ground-glass, tree in bud and consolidative opacities throughout both lungs, nonspecific but likely infectious or inflammatory in etiology. Given some of these have a nodular appearance, recommend follow-up CT in 3-6 months after course of treatment to ensure resolution. 2. Moderate to severe central bronchial thickening, also likely infectious or inflammatory. 3.  Aortic Atherosclerosis (ICD10-I70.0). 4. Nodular contours of the included liver suggests cirrhosis. 5. Right thyroid nodule measures 15 mm. Recommend nonemergent thyroid ultrasound characterization. Electronically Signed   By: Rubye OaksMelanie  Ehinger M.D.   On: 12/01/2017 02:30   Dg Chest Port 1 View  Result Date: 12/01/2017 CLINICAL DATA:  Shortness of breath.  Cough and congestion. EXAM: PORTABLE CHEST 1 VIEW COMPARISON:  Radiograph and CT 10/09/2013 FINDINGS: Unchanged heart size and mediastinal contours. Prominent right epicardial fat pad. Progressive perihilar peribronchial thickening. No confluent airspace disease. No pleural effusion or pneumothorax. No acute osseous abnormalities are seen. IMPRESSION: Increased bronchial thickening suggesting acute bronchitis or asthma/COPD exacerbation. Electronically Signed   By: Rubye OaksMelanie   Ehinger M.D.   On: 12/01/2017 01:27    ASSESSMENT AND PLAN:  1acute sepsis Resolved Secondary to influenza a infection and asthmatic/COPD exacerbation Treated on our sepsis protocol, cultures negative thus far, decrease IV fluids   2acute influenza a Resolving Continue supportive care and Tamiflu   3acute asthmatic/COPD exacerbation Resolved Exacerbated by above Discontinue Solu-Medrol, start prednisone taper, BTs prn, discontinue IV antibiotics  4acute hyponatremia/hypochloremia/hypokalemia Resolving  Continue IV fluids for rehydration  Potassium corrected  5chronic NASH Stable Avoid hepatotoxic agents  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 Management plans discussed with the patient, family and they are in agreement.  CODE STATUS: full  TOTAL TIME TAKING CARE OF THIS PATIENT: 45 minutes.     POSSIBLE D/C IN 1-2 DAYS, DEPENDING ON CLINICAL CONDITION.   Evelena AsaMontell D Trica Usery M.D on 12/02/2017   Between 7am to 6pm - Pager - [phone]  After 6pm go to [email or web address] - password [email or web address]  [facility] [place] [place] [facility]  Office  [phone]  CC: Primary care physician; System, Pcp Not In  Note: This dictation was prepared with Dragon dictation along with smaller phrase technology. Any transcriptional errors that result from this process are unintentional.

## 2017-12-02 NOTE — Care Management (Signed)
Patient declines need for home health physical therapy at home.  She is currently on room air and no longer requiring 02. Says she is independent in her adls and able to drive.  Denies she has any issues with obtaining medications.  She ask CM "Will you please kill that bug that keeps going up and down the wall.  It is driving me crazy. It never stays still."  There is nothing on the wall or in the vicinity that resembles an insect.  There is a clamp on the coiled computer cord but it is not moving  Informed primary nurse.

## 2017-12-02 NOTE — Progress Notes (Signed)
Initial Nutrition Assessment  DOCUMENTATION CODES:   Obesity unspecified  INTERVENTION:  Will downgrade diet to dysphagia 2 with thin liquids.  Provide Premier Protein po BID, each supplement provides 160 kcal and 30 grams of protein.  Provide daily MVI.  Encouraged patient to choose a source of protein at each meal.  NUTRITION DIAGNOSIS:   Inadequate oral intake related to social / environmental circumstances(stress from caring for family members and losing daughter recently) as evidenced by per patient/family report.  GOAL:   Patient will meet greater than or equal to 90% of their needs  MONITOR:   PO intake, Supplement acceptance, Labs, Weight trends, I & O's  REASON FOR ASSESSMENT:   Malnutrition Screening Tool    ASSESSMENT:   77 year old female with PMHx of COPD, DM type 2, HTN, arthritis, umbilical hernia, NASH, asthma now admitted with acute exacerbation of asthma and COPD, found to be positive for influenza A, also with clinical depression from recent death of daughter pending psychiatry evaluation.   Met with patient at bedside. She reports she has had unintentional weight loss over the past year related to stressors in her life (caring for sister and nephew, recent loss of daughter). She has not noticed a change in her appetite this past year and denies eating smaller portions or making any dietary changes. She typically eats 3 meals per day and prepares her own food. For breakfast she has cereal with no sugar and milk. For lunch she has broiled chicken or a sandwich. For dinner she may have vegetable soup. She does not currently drink any ONS or take any vitamins/minerals. She is edentulous and usually wears dentures at meals but they are at home. She would like her food to be chopped. She occasionally has trouble swallowing rice but just avoids it. Denied any constipation/diarrhea. She has been experiencing dry mouth for a while now (pt stopped multiple times during  assessment to take sips of water before answering a question).  She reports her UBW was 216 lbs last year and has been losing weight unintentionally. She does not know how much she weighed at her lowest, but believes she has gained some weight. She reports losing 1-2 sizes in clothing. Limited weight history in chart so unable to see trend.  Medications reviewed and include: glipizide 2.5 mg daily, Novolog 0-20 units TID, Novolog 0-5 units QHS, Lantus 20 units QHS, lactobacillus TID, metformin 500 mg BID, Tamiflu, pantoprazole, potassium chloride 10 mEq daily, prednisone 50 mg daily, NS @ 75 mL/hr.  Labs reviewed: CBG 270-422 past 24 hrs, Sodium 131, Chloride 98, CO2 20, BUN 31.  Patient is at risk for malnutrition. Muscle wasting could be related to sarcopenia and other factors such as weight loss are self-reported.  NUTRITION - FOCUSED PHYSICAL EXAM:    Most Recent Value  Orbital Region  Mild depletion  Upper Arm Region  Mild depletion  Thoracic and Lumbar Region  No depletion  Buccal Region  Unable to assess  Temple Region  Mild depletion  Clavicle Bone Region  Mild depletion  Clavicle and Acromion Bone Region  Mild depletion  Scapular Bone Region  Unable to assess  Dorsal Hand  No depletion  Patellar Region  Mild depletion  Anterior Thigh Region  Mild depletion  Posterior Calf Region  No depletion  Edema (RD Assessment)  None  Hair  Reviewed  Eyes  Reviewed  Mouth  Reviewed [edentulous]  Skin  Reviewed  Nails  Reviewed     Diet Order:  Diet heart healthy/carb modified Room service appropriate? Yes; Fluid consistency: Thin  EDUCATION NEEDS:   Education needs have been addressed  Skin:  Skin Assessment: Reviewed RN Assessment(ecchymosis to bilateral arms)  Last BM:  12/01/2017 - small type 2  Height:   Ht Readings from Last 1 Encounters:  12/01/17 '5\' 2"'$  (1.575 m)    Weight:   Wt Readings from Last 1 Encounters:  12/01/17 165 lb 3.2 oz (74.9 kg)    Ideal Body  Weight:  50 kg  BMI:  Body mass index is 30.22 kg/m.  Estimated Nutritional Needs:   Kcal:  1440-1680 (MSJ x 1.2-1.4)  Protein:  75-90 grams (1-1.2 grams/kg)  Fluid:  1.5 L/day (30 mL/kg IBW)  Willey Blade, MS, RD, LDN Office: [phone] Pager: [phone] After Hours/Weekend Pager: [phone]

## 2017-12-02 NOTE — Evaluation (Signed)
Physical Therapy Evaluation Patient Details Name: Kendra Clarke MRN: [id number] DOB: [birth year] Today's Date: 12/02/2017   History of Present Illness  Pt admitted for sepsis with complaints of cough, congestion and weakness x 1 week. Pt with history including asthma, COPD, DM, and HTN. Of note, pt just recently lost daughter and presents with depressed affect, tearful and crying during session.  Clinical Impression  Pt is a pleasant 77 year old female who was admitted for sepsis. Pt performs bed mobility with min assist, transfers with cga, and ambulation with cga and RW. Pt declines further ambulation at this time reporting she feels she will gain more energy after she eats breakfast. Presents with depressed affect and starts crying during session about losing daughter recently. Reports she feels at baseline for returning home and does not wish to go to SNF. Pt demonstrates deficits with strength/endurance/mobility. Heavily encouraged further mobility with RN and PT staff to maintain strength for home discharge. Will need 24/7 care at this time to maintain safety as pt high falls risk. Would benefit from skilled PT to address above deficits and promote optimal return to PLOF. Recommend transition to HHPT upon discharge from acute hospitalization.       Follow Up Recommendations Home health PT;Supervision/Assistance - 24 hour    Equipment Recommendations  None recommended by PT    Recommendations for Other Services       Precautions / Restrictions Precautions Precautions: Fall Restrictions Weight Bearing Restrictions: No      Mobility  Bed Mobility Overal bed mobility: Needs Assistance Bed Mobility: Supine to Sit     Supine to sit: Min assist     General bed mobility comments: needs assist for trunk stability when sitting at EOB in addition to moving B LEs off bed. Once seated, able to position herself at EOB with supervision. Productive cough noted while at  EOB.  Transfers Overall transfer level: Needs assistance Equipment used: Rolling walker (2 wheeled) Transfers: Sit to/from Stand Sit to Stand: Min guard         General transfer comment: Pt pulls on RW to stand, able to stand with upright posture. No buckling noted  Ambulation/Gait Ambulation/Gait assistance: Min guard Ambulation Distance (Feet): 3 Feet Assistive device: Rolling walker (2 wheeled) Gait Pattern/deviations: Step-to pattern     General Gait Details: ambulated with RW to recliner, declined further mobility at this time despite encouragement. Small step to gait pattern. Safe transition down to Best boy    Modified Rankin (Stroke Patients Only)       Balance Overall balance assessment: Needs assistance;History of Falls Sitting-balance support: Feet supported Sitting balance-Leahy Scale: Good     Standing balance support: Bilateral upper extremity supported Standing balance-Leahy Scale: Good                               Pertinent Vitals/Pain Pain Assessment: No/denies pain    Home Living Family/patient expects to be discharged to:: Private residence Living Arrangements: Engineer, building services and son) Available Help at Discharge: Family;Available 24 hours/day Type of Home: Mobile home Home Access: Stairs to enter Entrance Stairs-Rails: Can reach both Entrance Stairs-Number of Steps: 3 Home Layout: One level Home Equipment: Walker - 2 wheels;Crutches      Prior Function Level of Independence: Independent with assistive device(s)         Comments: has been using RW, reports  recent fall when trying to go to bathroom at night without RW     Hand Dominance        Extremity/Trunk Assessment   Upper Extremity Assessment Upper Extremity Assessment: Generalized weakness(B UE grossly 4/5)    Lower Extremity Assessment Lower Extremity Assessment: Generalized weakness(B LE grossly 3+/5)        Communication   Communication: No difficulties  Cognition Arousal/Alertness: Awake/alert Behavior During Therapy: (sad) Overall Cognitive Status: Within Functional Limits for tasks assessed                                        General Comments      Exercises     Assessment/Plan    PT Assessment Patient needs continued PT services  PT Problem List Decreased strength;Decreased activity tolerance;Decreased balance;Decreased mobility;Decreased safety awareness       PT Treatment Interventions Gait training;Therapeutic exercise;Balance training    PT Goals (Current goals can be found in the Care Plan section)  Acute Rehab PT Goals Patient Stated Goal: to go home PT Goal Formulation: With patient Time For Goal Achievement: 12/16/17 Potential to Achieve Goals: Good    Frequency Min 2X/week   Barriers to discharge        Co-evaluation               AM-PAC PT "6 Clicks" Daily Activity  Outcome Measure Difficulty turning over in bed (including adjusting bedclothes, sheets and blankets)?: Unable Difficulty moving from lying on back to sitting on the side of the bed? : Unable Difficulty sitting down on and standing up from a chair with arms (e.g., wheelchair, bedside commode, etc,.)?: Unable Help needed moving to and from a bed to chair (including a wheelchair)?: A Little Help needed walking in hospital room?: A Little Help needed climbing 3-5 steps with a railing? : A Little 6 Click Score: 12    End of Session Equipment Utilized During Treatment: Gait belt Activity Tolerance: Patient tolerated treatment well Patient left: in chair;with chair alarm set;with family/visitor present(requesting to eat breakfast as her tray arrived) Nurse Communication: Mobility status PT Visit Diagnosis: Unsteadiness on feet (R26.81);Muscle weakness (generalized) (M62.81);History of falling (Z91.81);Difficulty in walking, not elsewhere classified (R26.2)    Time:  [id number] PT Time Calculation (min) (ACUTE ONLY): 21 min   Charges:   PT Evaluation $PT Eval Low Complexity: 1 Low     PT G CodesElizabeth Palau:        Lasaundra Riche, PT, DPT [phone]   Lolly Glaus 12/02/2017, 10:06 AM

## 2017-12-02 NOTE — Progress Notes (Signed)
Inpatient Diabetes Program Recommendations  AACE/ADA: New Consensus Statement on Inpatient Glycemic Control (2015)  Target Ranges:  Prepandial:   less than 140 mg/dL      Peak postprandial:   less than 180 mg/dL (1-2 hours)      Critically ill patients:  140 - 180 mg/dL   Lab Results  Component Value Date   GLUCAP 343 (H) 12/02/2017    Review of Glycemic ControlResults for Kendra GoslingHESTER, Emmajean A (MRN [id number]) as of 12/02/2017 09:56  Ref. Range 12/01/2017 07:41 12/01/2017 11:45 12/01/2017 17:10 12/01/2017 21:04 12/02/2017 07:39  Glucose-Capillary Latest Ref Range: 65 - 99 mg/dL 528274 (H) 413369 (H) 244270 (H) 286 (H) 343 (H)   Diabetes history: Type 2 DM Outpatient Diabetes medications: Glipizide 2.5 mg with breakfast Current orders for Inpatient glycemic control:  Novolog resistant tid with meals and HS, Glipzide 2.5 mg with breakfast Prednisone 50 mg daily  Inpatient Diabetes Program Recommendations:   Please order A1c to determine pre-hospitalization glycemic control. May consider adding Lantus 15 units daily while in the hospital.  Also consider adding Novolog meal coverage 3 units tid with meals to cover CHO intake.  Please d/c Glipizide while patient is hospitalized.   Thanks,  Beryl MeagerJenny Lumen Brinlee, RN, BC-ADM Inpatient Diabetes Coordinator Pager [phone]

## 2017-12-02 NOTE — Progress Notes (Signed)
PHARMACY - PHYSICIAN COMMUNICATION CRITICAL VALUE ALERT - BLOOD CULTURE IDENTIFICATION (BCID)  Kendra Clarke is an 77 y.o. female [facility] with a chief complaint of acute asthmatic/COPD exacerbation   Assessment:  acute sepsis Secondary to influenza a infection and asthmatic/COPD exacerbation  Name of physician (or Provider) Contacted: Montell Salary  Current antibiotics: none  Changes to prescribed antibiotics recommended: Order Unasyn IV. MD recommendation to use Augmentin PO as patient has lost IV access.  Ordered Augmentin 875mg  PO BID.   Results for orders placed or performed during the hospital encounter of 11/30/17  Blood Culture ID Panel (Reflexed) (Collected: 12/01/2017 12:24 AM)  Result Value Ref Range   Enterococcus species NOT DETECTED NOT DETECTED   Listeria monocytogenes NOT DETECTED NOT DETECTED   Staphylococcus species NOT DETECTED NOT DETECTED   Staphylococcus aureus NOT DETECTED NOT DETECTED   Streptococcus species NOT DETECTED NOT DETECTED   Streptococcus agalactiae NOT DETECTED NOT DETECTED   Streptococcus pneumoniae NOT DETECTED NOT DETECTED   Streptococcus pyogenes NOT DETECTED NOT DETECTED   Acinetobacter baumannii NOT DETECTED NOT DETECTED   Enterobacteriaceae species NOT DETECTED NOT DETECTED   Enterobacter cloacae complex NOT DETECTED NOT DETECTED   Escherichia coli NOT DETECTED NOT DETECTED   Klebsiella oxytoca NOT DETECTED NOT DETECTED   Klebsiella pneumoniae NOT DETECTED NOT DETECTED   Proteus species NOT DETECTED NOT DETECTED   Serratia marcescens NOT DETECTED NOT DETECTED   Haemophilus influenzae DETECTED (A) NOT DETECTED   Neisseria meningitidis NOT DETECTED NOT DETECTED   Pseudomonas aeruginosa NOT DETECTED NOT DETECTED   Candida albicans NOT DETECTED NOT DETECTED   Candida glabrata NOT DETECTED NOT DETECTED   Candida krusei NOT DETECTED NOT DETECTED   Candida parapsilosis NOT DETECTED NOT DETECTED   Candida  tropicalis NOT DETECTED NOT DETECTED    Rithy Mandley K, RPH 12/02/2017  6:00 PM

## 2017-12-03 LAB — BLOOD GAS, ARTERIAL
ACID-BASE EXCESS: 2.2 mmol/L — AB (ref 0.0–2.0)
BICARBONATE: 25.3 mmol/L (ref 20.0–28.0)
FIO2: 0.21
O2 SAT: 95.7 %
PCO2 ART: 34 mmHg (ref 32.0–48.0)
PH ART: 7.48 — AB (ref 7.350–7.450)
Patient temperature: 37
pO2, Arterial: 74 mmHg — ABNORMAL LOW (ref 83.0–108.0)

## 2017-12-03 LAB — PROCALCITONIN: Procalcitonin: 2.34 ng/mL

## 2017-12-03 LAB — HIV ANTIBODY (ROUTINE TESTING W REFLEX): HIV Screen 4th Generation wRfx: NONREACTIVE

## 2017-12-03 LAB — GLUCOSE, CAPILLARY
GLUCOSE-CAPILLARY: 196 mg/dL — AB (ref 65–99)
Glucose-Capillary: 212 mg/dL — ABNORMAL HIGH (ref 65–99)

## 2017-12-03 MED ORDER — AMOXICILLIN-POT CLAVULANATE 875-125 MG PO TABS: 1.0000 | ORAL_TABLET | Freq: Two times a day (BID) | ORAL | 0 refills | Status: DC

## 2017-12-03 MED ORDER — OSELTAMIVIR PHOSPHATE 30 MG PO CAPS: 30.0000 mg | ORAL_CAPSULE | Freq: Two times a day (BID) | ORAL | 0 refills | Status: DC

## 2017-12-03 MED ORDER — PREDNISONE 50 MG PO TABS: 20.0000 mg | ORAL_TABLET | Freq: Every day | ORAL | 0 refills | Status: DC

## 2017-12-03 MED ORDER — ATROVENT HFA 17 MCG/ACT IN AERS: 2.0000 | INHALATION_SPRAY | RESPIRATORY_TRACT | 1 refills | Status: DC | PRN

## 2017-12-03 MED ORDER — FLORANEX PO PACK: 1.0000 g | PACK | Freq: Three times a day (TID) | ORAL | 0 refills | Status: DC

## 2017-12-03 MED ORDER — ADULT MULTIVITAMIN W/MINERALS CH: 1.0000 | ORAL_TABLET | Freq: Every day | ORAL | 0 refills | Status: DC

## 2017-12-03 MED ORDER — PREMIER PROTEIN SHAKE: 11.0000 [oz_av] | Freq: Two times a day (BID) | ORAL | 0 refills | Status: DC

## 2017-12-03 MED ORDER — LOPERAMIDE HCL 2 MG PO CAPS: 2.0000 mg | ORAL_CAPSULE | ORAL | 0 refills | Status: DC | PRN

## 2017-12-03 MED ORDER — PREDNISONE 20 MG PO TABS: 20.0000 mg | ORAL_TABLET | Freq: Every day | ORAL | 0 refills | Status: DC

## 2017-12-03 MED ORDER — PREDNISONE 20 MG PO TABS
20.0000 mg | ORAL_TABLET | Freq: Every day | ORAL | Status: DC
  Administered 2017-12-03: 08:00:00 20 mg via ORAL
  Filled 2017-12-03: qty 1

## 2017-12-03 MED ORDER — IPRATROPIUM-ALBUTEROL 20-100 MCG/ACT IN AERS: 1.0000 | INHALATION_SPRAY | Freq: Four times a day (QID) | RESPIRATORY_TRACT | 1 refills | Status: DC

## 2017-12-03 MED ORDER — ERYTHROMYCIN 5 MG/GM OP OINT: TOPICAL_OINTMENT | Freq: Four times a day (QID) | OPHTHALMIC | 0 refills | Status: DC

## 2017-12-03 NOTE — Care Management Note (Signed)
Case Management Note  Patient Details  Name: Kendra Clarke MRN: [id number] Date of Birth: 10-22-1940  Subjective/Objective:   Received call from son, Kendra Clarke [phone]). He states he plans to take his mother to his home and would like her to have home health. Offered choice of home health agencies. Referral to Kendra Clarke with [facility] for RN, PT and SW (patients dgt died 2 days ago).  Sons address[place] [address]  [place], [place] [id number]. Discharging today.  PCP is Kendra FerrariBrenda Clarke per patient she was last seen approximately 6 weeks ago.                 Action/Plan: [facility] for RN, PT and SW.   Expected Discharge Date:  12/03/17               Expected Discharge Plan:  Home w Home Health Services  In-House Referral:     Discharge planning Services  CM Consult  Post Acute Care Choice:  Home Health Choice offered to:  Patient, Adult Children  DME Arranged:    DME Agency:     HH Arranged:  RN, PT, Social Work [employer] Agency:  [facility]  Status of Service:  Completed, signed off  If discussed at [employer] of [employer] Meetings, dates discussed:    Additional Comments:  Kendra MemosLisa M Jaymason Ledesma, RN 12/03/2017, 1:59 PM

## 2017-12-03 NOTE — Care Management (Signed)
Met with patient at bedside. Discuss setting up home health PT, SW, RN. She stated, I can let anyone see my house that way. Im afraid they will try and separate me and my sister. I explained to her the purpose of home health and each discipline. She was sad. I just can right now. I am sorry. Im just a stubborn old woman. Ask her to please contact RNCM in the event she changed her mind. Dr. Jerelyn Charles updated. Case closed.

## 2017-12-03 NOTE — Progress Notes (Signed)
SATURATION QUALIFICATIONS: (This note is used to comply with regulatory documentation for home oxygen)  Patient Saturations on Room Air at Rest =  95%  Patient Saturations on Room Air while Ambulating = 94  Patient Saturations on Liters of oxygen while Ambulating =   Please briefly explain why patient needs home oxygen:

## 2017-12-03 NOTE — Progress Notes (Signed)
Received MD order to discharge patient to home with home health  Reviewed home meds, discharge instructions and follow up appointments with patient and son and both verbalized understanding

## 2017-12-03 NOTE — Discharge Summary (Signed)
[facility] - [place] at [facility]   PATIENT NAME: Kendra Clarke    MR#:  [id number]  DATE OF BIRTH:  1941-02-15  DATE OF ADMISSION:  11/30/2017 ADMITTING PHYSICIAN: Bertrum Sol, MD  DATE OF DISCHARGE: No discharge date for patient encounter.  PRIMARY CARE PHYSICIAN: System, Pcp Not In    ADMISSION DIAGNOSIS:  diff breathing  DISCHARGE DIAGNOSIS:  Principal Problem:   Grief at loss of child Active Problems:   Sepsis (HCC)   SECONDARY DIAGNOSIS:   Past Medical History:  Diagnosis Date  . Arthritis   . Asthma   . COPD (chronic obstructive pulmonary disease) (HCC)   . Diabetes mellitus without complication (HCC)   . Hypertension   . Liver disease   . Umbilical hernia     HOSPITAL COURSE:   1acute sepsis Resolved Secondary to influenza a infection and asthmatic/COPD exacerbation Treated on our sepsis protocol, cultures noted for Haemophilus-treated with Rocephin/azithromycin initially, converted to Augmentin    2acute influenza a Resolving Continued supportive care and Tamiflu   3acute asthmatic/COPD exacerbation Resolved Exacerbated by above Was treated with IV Solu-Medrol which was discontinued in favor of prednisone taper, provided BTs prn, and patient did well  4acute hyponatremia/hypochloremia/hypokalemia Repleted with IV fluids and p.o. 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 Stable PPI provided daily  7chronic diabetes mellitus type 2 Controlled on current regiment  Was exacerbated by steroid use   8acute right eye infection Resolving Continue erythromycin 4 times daily for 5-day course  9 acute clinical depression/bereavement Death of daughter recently Psychiatry did see patient while in house-no intervention recommended, will arrange for outpatient psychiatry reevaluation in 1 week status post discharge  DISCHARGE CONDITIONS:  On day of  discharge patient is afebrile, he dynamically stable, tolerating diet, ready for discharge home, for more specific details see chart  CONSULTS OBTAINED:  Treatment Team:  Clapacs, Jackquline Denmark, MD  DRUG ALLERGIES:   Allergies  Allergen Reactions  . Codeine Anxiety  . Sulfa Antibiotics Rash    DISCHARGE MEDICATIONS:   Allergies as of 12/03/2017      Reactions   Codeine Anxiety   Sulfa Antibiotics Rash      Medication List    TAKE these medications   amLODipine 10 MG tablet Commonly known as:  NORVASC Take 10 mg by mouth daily.   amoxicillin-clavulanate 875-125 MG tablet Commonly known as:  AUGMENTIN Take 1 tablet by mouth every 12 (twelve) hours.   ATROVENT HFA 17 MCG/ACT inhaler Generic drug:  ipratropium Inhale 2 puffs into the lungs every 6 (six) hours as needed.   dicyclomine 20 MG tablet Commonly known as:  BENTYL Take 1 tablet (20 mg total) by mouth 3 (three) times daily as needed for spasms.   erythromycin ophthalmic ointment Place into the right eye every 6 (six) hours.   furosemide 20 MG tablet Commonly known as:  LASIX Take 20 mg by mouth.   glipiZIDE 10 MG 24 hr tablet Commonly known as:  GLUCOTROL XL Take 10 mg by mouth daily.   lactobacillus Pack Take 1 packet (1 g total) by mouth 3 (three) times daily with meals.   lisinopril 40 MG tablet Commonly known as:  PRINIVIL,ZESTRIL Take 40 mg by mouth daily.   loperamide 2 MG capsule Commonly known as:  IMODIUM Take 1 capsule (2 mg total) by mouth as needed for diarrhea or loose stools.   metFORMIN 1000 MG tablet Commonly known as:  GLUCOPHAGE Take 1,000 mg by mouth 2 (two) times daily with a meal.   metoprolol tartrate 50 MG tablet Commonly known as:  LOPRESSOR Take 50 mg by mouth 2 (two) times daily.   multivitamin with minerals Tabs tablet Take 1 tablet by mouth daily. Start taking on:  12/04/2017   oseltamivir 30 MG capsule Commonly known as:  TAMIFLU Take 1 capsule (30 mg total) by mouth  2 (two) times daily.   potassium chloride 10 MEQ tablet Commonly known as:  K-DUR,KLOR-CON Take 10 mEq by mouth every morning.   predniSONE 20 MG tablet Commonly known as:  DELTASONE Take 1 tablet (20 mg total) by mouth daily with breakfast. Start taking on:  12/04/2017   protein supplement shake Liqd Commonly known as:  PREMIER PROTEIN Take 325 mLs (11 oz total) by mouth 2 (two) times daily between meals.   SYMBICORT 160-4.5 MCG/ACT inhaler Generic drug:  budesonide-formoterol Take 1 puff by mouth 2 (two) times daily.   TRADJENTA 5 MG Tabs tablet Generic drug:  linagliptin Take 5 mg by mouth daily.        DISCHARGE INSTRUCTIONS:   If you experience worsening of your admission symptoms, develop shortness of breath, life threatening emergency, suicidal or homicidal thoughts you must seek medical attention immediately by calling 911 or calling your MD immediately  if symptoms less severe.  You Must read complete instructions/literature along with all the possible adverse reactions/side effects for all the Medicines you take and that have been prescribed to you. Take any new Medicines after you have completely understood and accept all the possible adverse reactions/side effects.   Please note  You were cared for by a hospitalist during your hospital stay. If you have any questions about your discharge medications or the care you received while you were in the hospital after you are discharged, you can call the unit and asked to speak with the hospitalist on call if the hospitalist that took care of you is not available. Once you are discharged, your primary care physician will handle any further medical issues. Please note that NO REFILLS for any discharge medications will be authorized once you are discharged, as it is imperative that you return to your primary care physician (or establish a relationship with a primary care physician if you do not have one) for your aftercare needs  so that they can reassess your need for medications and monitor your lab values.    Today   CHIEF COMPLAINT:   Chief Complaint  Patient presents with  . Cough  . Shortness of Breath  . Nasal Congestion    HISTORY OF PRESENT ILLNESS:  77 y.o. female with a known history with history per below which also includes NASH, GERD, presenting from home with flulike symptoms for a week, productive cough, fevers, chills, night sweats, wheezing, in the emergency room patient was found to have increased bronchial thickening on chest x-ray concerning for COPD/asthmatic exacerbation, influenza A positive, multiple electrolyte abnormalities, noted fevers, tachypnea, tachycardia, acute kidney injury with creatinine 1.6, lactic acid 2.1, patient is now been admitted for sepsis secondary to influenza A and acute asthmatic/COPD exacerbation.    VITAL SIGNS:  Blood pressure (!) 145/60, pulse 80, temperature 97.7 F (36.5 C), temperature source Oral, resp. rate 17, height 5\' 2"  (1.575 m), weight 74.9 kg (165 lb 3.2 oz), SpO2 97 %.  I/O:  No intake or output data in the 24 hours ending 12/03/17 0925  PHYSICAL EXAMINATION:  GENERAL:  77 y.o.-year-old  patient lying in the bed with no acute distress.  EYES: Pupils equal, round, reactive to light and accommodation. No scleral icterus. Extraocular muscles intact.  HEENT: Head atraumatic, normocephalic. Oropharynx and nasopharynx clear.  NECK:  Supple, no jugular venous distention. No thyroid enlargement, no tenderness.  LUNGS: Normal breath sounds bilaterally, no wheezing, rales,rhonchi or crepitation. No use of accessory muscles of respiration.  CARDIOVASCULAR: S1, S2 normal. No murmurs, rubs, or gallops.  ABDOMEN: Soft, non-tender, non-distended. Bowel sounds present. No organomegaly or mass.  EXTREMITIES: No pedal edema, cyanosis, or clubbing.  NEUROLOGIC: Cranial nerves II through XII are intact. Muscle strength 5/5 in all extremities. Sensation intact.  Gait not checked.  PSYCHIATRIC: The patient is alert and oriented x 3.  SKIN: No obvious rash, lesion, or ulcer.   DATA REVIEW:   CBC Recent Labs  Lab 12/01/17 0024  WBC 8.7  HGB 13.7  HCT 39.9  PLT 203    Chemistries  Recent Labs  Lab 12/01/17 0024 12/02/17 0916  NA 129* 131*  K 2.9* 3.7  CL 89* 98*  CO2 24 20*  GLUCOSE 287* 366*  BUN 45* 31*  CREATININE 1.67* 0.93  CALCIUM 7.6* 7.1*  AST 71*  --   ALT 38  --   ALKPHOS 113  --   BILITOT 4.6*  --     Cardiac Enzymes Recent Labs  Lab 12/01/17 0024  TROPONINI <0.03    Microbiology Results  Results for orders placed or performed during the hospital encounter of 11/30/17  Blood Culture (routine x 2)     Status: None (Preliminary result)   Collection Time: 12/01/17 12:24 AM  Result Value Ref Range Status   Specimen Description   Final    BLOOD RIGHT ANTECUBITAL Performed at [facility], [address], [place], [place] [address]    Special Requests   Final    BOTTLES DRAWN AEROBIC AND ANAEROBIC Blood Culture results may not be optimal due to an excessive volume of blood received in culture bottles Performed at [facility], [address] [place], [place] [address]    Culture  Setup Time   Final    AEROBIC BOTTLE ONLY GRAM NEGATIVE COCCOBACILLI CRITICAL RESULT CALLED TO, READ BACK BY AND VERIFIED WITH: CHRISTINE KATSOUDAS ON 12/02/17 AT 1659 [facility] Performed at [facility], [address], [place], [place] [address]    Culture GRAM NEGATIVE COCCOBACILLI  Final   Report Status PENDING  Incomplete  Blood Culture (routine x 2)     Status: None (Preliminary result)   Collection Time: 12/01/17 12:24 AM  Result Value Ref Range Status   Specimen Description BLOOD LEFT WRIST  Final   Special Requests   Final    BOTTLES DRAWN AEROBIC AND ANAEROBIC Blood Culture results may not be optimal due to an excessive volume of blood received in culture bottles   Culture   Final    NO GROWTH 2  DAYS Performed at [facility], [address] [place], [place] [address]    Report Status PENDING  Incomplete  Urine culture     Status: None   Collection Time: 12/01/17 12:24 AM  Result Value Ref Range Status   Specimen Description   Final    URINE, RANDOM Performed at [facility], [address], [place], [place] [address]    Special Requests   Final    NONE Performed at [facility], [address], [place], [place] [address]    Culture  Final    NO GROWTH Performed at [facility], [address] [place], [place] [address]    Report Status 12/02/2017 FINAL  Final  Blood Culture ID Panel (Reflexed)     Status: Abnormal   Collection Time: 12/01/17 12:24 AM  Result Value Ref Range Status   Enterococcus species NOT DETECTED NOT DETECTED Final   Listeria monocytogenes NOT DETECTED NOT DETECTED Final   Staphylococcus species NOT DETECTED NOT DETECTED Final   Staphylococcus aureus NOT DETECTED NOT DETECTED Final   Streptococcus species NOT DETECTED NOT DETECTED Final   Streptococcus agalactiae NOT DETECTED NOT DETECTED Final   Streptococcus pneumoniae NOT DETECTED NOT DETECTED Final   Streptococcus pyogenes NOT DETECTED NOT DETECTED Final   Acinetobacter baumannii NOT DETECTED NOT DETECTED Final   Enterobacteriaceae species NOT DETECTED NOT DETECTED Final   Enterobacter cloacae complex NOT DETECTED NOT DETECTED Final   Escherichia coli NOT DETECTED NOT DETECTED Final   Klebsiella oxytoca NOT DETECTED NOT DETECTED Final   Klebsiella pneumoniae NOT DETECTED NOT DETECTED Final   Proteus species NOT DETECTED NOT DETECTED Final   Serratia marcescens NOT DETECTED NOT DETECTED Final   Haemophilus influenzae DETECTED (A) NOT DETECTED Final    Comment: CRITICAL RESULT CALLED TO, READ BACK BY AND VERIFIED WITH: CHRISTINE KATSOUDAS ON 12/02/17 AT 1649 [facility]    Neisseria meningitidis NOT DETECTED NOT DETECTED Final   Pseudomonas aeruginosa NOT  DETECTED NOT DETECTED Final   Candida albicans NOT DETECTED NOT DETECTED Final   Candida glabrata NOT DETECTED NOT DETECTED Final   Candida krusei NOT DETECTED NOT DETECTED Final   Candida parapsilosis NOT DETECTED NOT DETECTED Final   Candida tropicalis NOT DETECTED NOT DETECTED Final    Comment: Performed at [facility], [address] [place], [place] [address]  Culture, sputum-assessment     Status: None   Collection Time: 12/01/17 12:54 PM  Result Value Ref Range Status   Specimen Description SPUTUM  Final   Special Requests NONE  Final   Sputum evaluation   Final    Sputum specimen not acceptable for testing.  Please recollect.   MEGAN OAKLEY ON 12/01/17 AT 1346 Performed at [facility], [address] [place], [place] [address]    Report Status 12/01/2017 FINAL  Final  C difficile quick scan w PCR reflex     Status: None   Collection Time: 12/02/17  6:02 AM  Result Value Ref Range Status   C Diff antigen NEGATIVE NEGATIVE Final   C Diff toxin NEGATIVE NEGATIVE Final   C Diff interpretation No C. difficile detected.  Final    Comment: NEGATIVE Performed at [facility], [address] [place], [place] [address]     RADIOLOGY:  No results found.  EKG:   Orders placed or performed during the hospital encounter of 11/30/17  . ED EKG 12-Lead  . ED EKG 12-Lead  . EKG 12-Lead  . EKG 12-Lead      Management plans discussed with the patient, family and they are in agreement.  CODE STATUS:     Code Status Orders  (From admission, onward)        Start     Ordered   12/01/17 0220  Full code  Continuous     12/01/17 0220    Code Status History    Date Active Date Inactive Code Status Order ID Comments User Context   This patient has a current code status but no historical code status.  TOTAL TIME TAKING CARE OF THIS PATIENT: 45 minutes.    Evelena Asa Khali Perella M.D on 12/03/2017 at 9:25 AM  Between 7am to 6pm - Pager -  [phone]  After 6pm go to [email or web address] - password [email or web address]  [facility]  Office  [phone]  CC: Primary care physician; System, Pcp Not In   Note: This dictation was prepared with Dragon dictation along with smaller phrase technology. Any transcriptional errors that result from this process are unintentional.

## 2017-12-04 LAB — LEGIONELLA PNEUMOPHILA SEROGP 1 UR AG: L. pneumophila Serogp 1 Ur Ag: NEGATIVE

## 2017-12-06 LAB — CULTURE, BLOOD (ROUTINE X 2): Culture: NO GROWTH

## 2017-12-29 LAB — CULTURE, BLOOD (ROUTINE X 2)

## 2019-03-09 ENCOUNTER — Emergency Department: Payer: Medicare HMO

## 2019-03-09 ENCOUNTER — Emergency Department
Admission: EM | Admit: 2019-03-09 | Discharge: 2019-03-09 | Disposition: A | Discharge status: Home / Self Care | Payer: Medicare HMO | Attending: Emergency Medicine | Admitting: Emergency Medicine

## 2019-03-09 ENCOUNTER — Other Ambulatory Visit: Payer: Self-pay

## 2019-03-09 DIAGNOSIS — M25512 Pain in left shoulder: Secondary | ICD-10-CM

## 2019-03-09 DIAGNOSIS — Z7984 Long term (current) use of oral hypoglycemic drugs: Secondary | ICD-10-CM | POA: Diagnosis not present

## 2019-03-09 DIAGNOSIS — E119 Type 2 diabetes mellitus without complications: Secondary | ICD-10-CM | POA: Insufficient documentation

## 2019-03-09 DIAGNOSIS — J449 Chronic obstructive pulmonary disease, unspecified: Secondary | ICD-10-CM | POA: Diagnosis not present

## 2019-03-09 DIAGNOSIS — I10 Essential (primary) hypertension: Secondary | ICD-10-CM | POA: Insufficient documentation

## 2019-03-09 MED ORDER — LIDOCAINE 5 % EX PTCH: 1.0000 | MEDICATED_PATCH | CUTANEOUS | 0 refills | Status: AC

## 2019-03-09 MED ORDER — LIDOCAINE 5 % EX PTCH
1.0000 | MEDICATED_PATCH | CUTANEOUS | Status: DC
  Administered 2019-03-09: 1 via TRANSDERMAL
  Filled 2019-03-09: qty 1

## 2019-03-09 MED ORDER — LIDOCAINE 5 % EX PTCH: 1.0000 | MEDICATED_PATCH | CUTANEOUS | 0 refills | Status: DC

## 2019-03-09 NOTE — ED Provider Notes (Signed)
[facility] Emergency Department Provider Note   ____________________________________________   First MD Initiated Contact with Patient 03/09/19 1342     (approximate)  I have reviewed the triage vital signs and the nursing notes.   HISTORY  Chief Complaint Shoulder Pain    HPI Kendra Clarke is a 78 y.o. female patient complain of left shoulder pain.  Patient that she fell last year and injured her left shoulder.  Patient states she was doing better until it flared up 3 months ago.  Patient rates pain as a 10/10.  Patient scribed pain is "achy".  No palliative measure for complaint.         Past Medical History:  Diagnosis Date  . Arthritis   . Asthma   . COPD (chronic obstructive pulmonary disease) ([place])   . Diabetes mellitus without complication ([place])   . Hypertension   . Liver disease   . Umbilical hernia     Patient Active Problem List   Diagnosis Date Noted  . Grief at loss of child 12/02/2017  . Sepsis ([place]) 12/01/2017    Past Surgical History:  Procedure Laterality Date  . ABDOMINAL HYSTERECTOMY    . BLADDER SURGERY    . CHOLECYSTECTOMY    . nose surgery      Prior to Admission medications   Medication Sig Start Date End Date Taking? Authorizing Provider  amLODipine (NORVASC) 10 MG tablet Take 10 mg by mouth daily. 09/24/17   [provider]  amoxicillin-clavulanate (AUGMENTIN) 875-125 MG tablet Take 1 tablet by mouth every 12 (twelve) hours. 12/03/17   Salary, Avel Peace, MD  ATROVENT HFA 17 MCG/ACT inhaler Inhale 2 puffs into the lungs every 4 (four) hours as needed for wheezing. 12/03/17   Salary, Holly Bodily D, MD  erythromycin ophthalmic ointment Place into the right eye every 6 (six) hours. 12/03/17   Salary, Avel Peace, MD  glipiZIDE (GLUCOTROL XL) 10 MG 24 hr tablet Take 10 mg by mouth daily. 09/24/17   [provider]  Ipratropium-Albuterol (COMBIVENT RESPIMAT) 20-100 MCG/ACT AERS respimat Inhale 1 puff into the  lungs every 6 (six) hours. 12/03/17   Salary, Avel Peace, MD  lactobacillus (FLORANEX/LACTINEX) PACK Take 1 packet (1 g total) by mouth 3 (three) times daily with meals. 12/03/17   Salary, Avel Peace, MD  lidocaine (LIDODERM) 5 % Place 1 patch onto the skin daily. Remove & Discard patch within 12 hours or as directed by MD 03/09/19 03/08/20  Sable Feil, PA-C  loperamide (IMODIUM) 2 MG capsule Take 1 capsule (2 mg total) by mouth as needed for diarrhea or loose stools. 12/03/17   Salary, Avel Peace, MD  metFORMIN (GLUCOPHAGE) 1000 MG tablet Take 1,000 mg by mouth 2 (two) times daily with a meal.    [provider]  Multiple Vitamin (MULTIVITAMIN WITH MINERALS) TABS tablet Take 1 tablet by mouth daily. 12/04/17   Salary, Avel Peace, MD  oseltamivir (TAMIFLU) 30 MG capsule Take 1 capsule (30 mg total) by mouth 2 (two) times daily. 12/03/17   Salary, Avel Peace, MD  predniSONE (DELTASONE) 50 MG tablet Take 0.5 tablets (25 mg total) by mouth daily with breakfast. 12/04/17   Salary, Avel Peace, MD  protein supplement shake (PREMIER PROTEIN) LIQD Take 325 mLs (11 oz total) by mouth 2 (two) times daily between meals. 12/03/17   Salary, Avel Peace, MD  TRADJENTA 5 MG TABS tablet Take 5 mg by mouth daily. 09/25/17   [provider]  Allergies Codeine and Sulfa antibiotics  No family history on file.  Social History Social History   Tobacco Use  . Smoking status: Never Smoker  . Smokeless tobacco: Never Used  Substance Use Topics  . Alcohol use: No  . Drug use: No    Review of Systems Constitutional: No fever/chills Eyes: No visual changes. ENT: No sore throat. Cardiovascular: Denies chest pain. Respiratory: Denies shortness of breath. Gastrointestinal: No abdominal pain.  No nausea, no vomiting.  No diarrhea.  No constipation. Genitourinary: Negative for dysuria. Musculoskeletal: Left shoulder pain. Skin: Negative for rash. Neurological: Negative for headaches, focal weakness or  numbness. Endocrine:  Diabetes and hypertension. Allergic/Immunilogical: Codeine and sulfa. ____________________________________________   PHYSICAL EXAM:  VITAL SIGNS: ED Triage Vitals  Enc Vitals Group     BP 03/09/19 1254 (!) 203/77     Pulse Rate 03/09/19 1254 (!) 103     Resp 03/09/19 1254 19     Temp 03/09/19 1254 98.6 F (37 C)     Temp Source 03/09/19 1254 Oral     SpO2 03/09/19 1254 96 %     Weight 03/09/19 1253 170 lb (77.1 kg)     Height 03/09/19 1253 5\' 3"  (1.6 m)     Head Circumference --      Peak Flow --      Pain Score 03/09/19 1303 10     Pain Loc --      Pain Edu? --      Excl. in GC? --    Hematological/Lymphatic/Immunilogical: No cervical lymphadenopathy. Cardiovascular: Normal rate, regular rhythm. Grossly normal heart sounds.  Good peripheral circulation. Respiratory: Normal respiratory effort.  No retractions. Lungs CTAB. Musculoskeletal: No obvious deformity to the left shoulder.  Decreased range of motion abduction overhead reach.  Neurologic:  Normal speech and language. No gross focal neurologic deficits are appreciated. No gait instability. Skin:  Skin is warm, dry and intact. No rash noted. Psychiatric: Mood and affect are normal. Speech and behavior are normal.  ____________________________________________   LABS (all labs ordered are listed, but only abnormal results are displayed)  Labs Reviewed - No data to display ____________________________________________  EKG   ____________________________________________  RADIOLOGY  ED MD interpretation:    Official radiology report(s): Dg Shoulder Left  Result Date: 03/09/2019 CLINICAL DATA:  Left shoulder pain. EXAM: LEFT SHOULDER - 2+ VIEW COMPARISON:  None. FINDINGS: No acute fracture or dislocation identified. Mild degenerative disease of the [facility] present. No bony lesions or destruction. Visualized soft tissues are unremarkable. 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 Electronically Signed   By: Irish LackGlenn  Yamagata M.D.   On: 03/09/2019 14:31    ____________________________________________   PROCEDURES  Procedure(s) performed (including Critical Care):  Procedures   ____________________________________________   INITIAL IMPRESSION / ASSESSMENT AND PLAN / ED COURSE  As part of my medical decision making, I reviewed the following data within the electronic MEDICAL RECORD NUMBER         Patient presents 3 months of atraumatic left shoulder pain.  Discussed x-ray findings with patient showing degenerative change in the [facility].  Patient placed in a sling and a Lidoderm patch was applied.  Patient follow orthopedic for definitive evaluation and treatment.      ____________________________________________   FINAL CLINICAL IMPRESSION(S) / ED DIAGNOSES  Final diagnoses:  Acute pain of left shoulder     ED Discharge Orders         Ordered    lidocaine (LIDODERM) 5 %  Every 24 hours     03/09/19 1444           Note:  This document was prepared using Dragon voice recognition software and may include unintentional dictation errors.    Joni ReiningSmith, Ronald K, PA-C 03/09/19 1447    Shaune PollackIsaacs, Cameron, MD 03/10/19 [phone]

## 2019-03-09 NOTE — Discharge Instructions (Signed)
Wear arm sling for 3 to 5 days as needed.  Follow-up orthopedic for definitive evaluation and treatment.

## 2019-03-09 NOTE — ED Notes (Signed)
See triage note states she fell about 2 years ago  Landed on left shoulder  States she did not have it looked at at that time  But states she has been doing a lot with her family  Member and now having increased pain with movement   No deformity noted

## 2019-03-09 NOTE — ED Triage Notes (Signed)
Pt states she fell last year and  Injured her left shoulder , states it was doing better and states it flared back up about 3 months ago.

## 2020-12-31 ENCOUNTER — Other Ambulatory Visit: Payer: Self-pay

## 2020-12-31 DIAGNOSIS — Y939 Activity, unspecified: Secondary | ICD-10-CM | POA: Insufficient documentation

## 2020-12-31 DIAGNOSIS — Z79899 Other long term (current) drug therapy: Secondary | ICD-10-CM | POA: Diagnosis not present

## 2020-12-31 DIAGNOSIS — J45909 Unspecified asthma, uncomplicated: Secondary | ICD-10-CM | POA: Insufficient documentation

## 2020-12-31 DIAGNOSIS — E119 Type 2 diabetes mellitus without complications: Secondary | ICD-10-CM | POA: Insufficient documentation

## 2020-12-31 DIAGNOSIS — M7051 Other bursitis of knee, right knee: Secondary | ICD-10-CM | POA: Diagnosis not present

## 2020-12-31 DIAGNOSIS — J449 Chronic obstructive pulmonary disease, unspecified: Secondary | ICD-10-CM | POA: Insufficient documentation

## 2020-12-31 DIAGNOSIS — Z7984 Long term (current) use of oral hypoglycemic drugs: Secondary | ICD-10-CM | POA: Diagnosis not present

## 2020-12-31 DIAGNOSIS — I10 Essential (primary) hypertension: Secondary | ICD-10-CM | POA: Diagnosis not present

## 2020-12-31 DIAGNOSIS — M25561 Pain in right knee: Secondary | ICD-10-CM | POA: Diagnosis present

## 2020-12-31 LAB — COMPREHENSIVE METABOLIC PANEL
ALT: 28 U/L (ref 0–44)
AST: 45 U/L — ABNORMAL HIGH (ref 15–41)
Albumin: 3.7 g/dL (ref 3.5–5.0)
Alkaline Phosphatase: 90 U/L (ref 38–126)
Anion gap: 9 (ref 5–15)
BUN: 12 mg/dL (ref 8–23)
CO2: 27 mmol/L (ref 22–32)
Calcium: 9.1 mg/dL (ref 8.9–10.3)
Chloride: 102 mmol/L (ref 98–111)
Creatinine, Ser: 0.85 mg/dL (ref 0.44–1.00)
GFR, Estimated: >60 mL/min (ref >60)
Glucose, Bld: 148 mg/dL — ABNORMAL HIGH (ref 70–99)
Potassium: 3.5 mmol/L (ref 3.5–5.1)
Sodium: 138 mmol/L (ref 135–145)
Total Bilirubin: 1.2 mg/dL (ref 0.3–1.2)
Total Protein: 7.4 g/dL (ref 6.5–8.1)

## 2020-12-31 LAB — CBC WITH DIFFERENTIAL/PLATELET
Abs Immature Granulocytes: 0.01 10*3/uL (ref 0.00–0.07)
Basophils Absolute: 0.1 10*3/uL (ref 0.0–0.1)
Basophils Relative: 1 %
Eosinophils Absolute: 0.3 10*3/uL (ref 0.0–0.5)
Eosinophils Relative: 4 %
HCT: 41.9 % (ref 36.0–46.0)
Hemoglobin: 13.8 g/dL (ref 12.0–15.0)
Immature Granulocytes: 0 %
Lymphocytes Relative: 21 %
Lymphs Abs: 1.4 10*3/uL (ref 0.7–4.0)
MCH: 30.5 pg (ref 26.0–34.0)
MCHC: 32.9 g/dL (ref 30.0–36.0)
MCV: 92.7 fL (ref 80.0–100.0)
Monocytes Absolute: 0.6 10*3/uL (ref 0.1–1.0)
Monocytes Relative: 9 %
Neutro Abs: 4.4 10*3/uL (ref 1.7–7.7)
Neutrophils Relative %: 65 %
Platelets: 156 10*3/uL (ref 150–400)
RBC: 4.52 MIL/uL (ref 3.87–5.11)
RDW: 13.1 % (ref 11.5–15.5)
WBC: 6.7 10*3/uL (ref 4.0–10.5)
nRBC: 0 % (ref 0.0–0.2)

## 2020-12-31 LAB — LACTIC ACID, PLASMA: Lactic Acid, Venous: 1.2 mmol/L (ref 0.5–1.9)

## 2020-12-31 NOTE — ED Notes (Signed)
Labs drawn and sent, Green top, Lavender, Argyle top and 1 set of cultures.

## 2020-12-31 NOTE — ED Triage Notes (Signed)
Pt arrives to ED from home via PoV with grand daughter with c/c of right knee pain x1 month. Pt seen by orthopedist on April 12th and told she had "an infection of capillary beds" and given antibiotics. Pt states pain tonight is unbearable. Pt denies F/C, NVD. Pt ambulatory with assistance.

## 2021-01-01 ENCOUNTER — Emergency Department: Payer: Medicare Other

## 2021-01-01 ENCOUNTER — Emergency Department
Admission: EM | Admit: 2021-01-01 | Discharge: 2021-01-01 | Disposition: A | Discharge status: Home / Self Care | Payer: Medicare Other | Attending: Emergency Medicine | Admitting: Emergency Medicine

## 2021-01-01 DIAGNOSIS — M7051 Other bursitis of knee, right knee: Secondary | ICD-10-CM

## 2021-01-01 DIAGNOSIS — M25561 Pain in right knee: Secondary | ICD-10-CM

## 2021-01-01 MED ORDER — KETOROLAC TROMETHAMINE 60 MG/2ML IM SOLN
30.0000 mg | Freq: Once | INTRAMUSCULAR | Status: AC
  Administered 2021-01-01: 30 mg via INTRAMUSCULAR
  Filled 2021-01-01: qty 2

## 2021-01-01 MED ORDER — OXYCODONE-ACETAMINOPHEN 5-325 MG PO TABS
1.0000 | ORAL_TABLET | Freq: Once | ORAL | Status: AC
  Administered 2021-01-01: 1 via ORAL
  Filled 2021-01-01: qty 1

## 2021-01-01 MED ORDER — DIPHENHYDRAMINE HCL 25 MG PO CAPS
25.0000 mg | ORAL_CAPSULE | Freq: Once | ORAL | Status: AC
  Administered 2021-01-01: 25 mg via ORAL
  Filled 2021-01-01: qty 1

## 2021-01-01 MED ORDER — OXYCODONE-ACETAMINOPHEN 5-325 MG PO TABS: 1.0000 | ORAL_TABLET | ORAL | 0 refills | Status: DC | PRN

## 2021-01-01 NOTE — Discharge Instructions (Addendum)
1.  You may take Percocet instead of Tramadol as needed for pain. 2.  Wear Ace wrap while you are awake.  Elevate right leg and apply ice over knee several times daily to reduce swelling. 3.  Use your walker to help you balance as you walk. 4.  Return to the ER for worsening symptoms, persistent vomiting, difficulty breathing or other concerns

## 2021-01-01 NOTE — ED Provider Notes (Signed)
[facility] Emergency Department Provider Note   ____________________________________________   Event Date/Time   First MD Initiated Contact with Patient 01/01/21 0222     (approximate)  I have reviewed the triage vital signs and the nursing notes.   HISTORY  Chief Complaint Leg Pain (Right side)    HPI Kendra Clarke is a 80 y.o. female who presents to the ED from home with a chief complaint of nontraumatic right knee pain.  Patient has been experiencing right knee pain x1 month.  Seen by Dr. 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  She was told she could have the injection after her infection subsided.  She was started on Toradol and tramadol which helped with the pain until this weekend.  Denies fever, chills, chest pain, shortness of breath, abdominal pain, nausea, vomiting or dizziness.     Past Medical History:  Diagnosis Date  . Arthritis   . Asthma   . COPD (chronic obstructive pulmonary disease) (HCC)   . Diabetes mellitus without complication (HCC)   . Hypertension   . Liver disease   . Umbilical hernia     Patient Active Problem List   Diagnosis Date Noted  . Grief at loss of child 12/02/2017  . Sepsis (HCC) 12/01/2017    Past Surgical History:  Procedure Laterality Date  . ABDOMINAL HYSTERECTOMY    . BLADDER SURGERY    . CHOLECYSTECTOMY    . nose surgery      Prior to Admission medications   Medication Sig Start Date End Date Taking? Authorizing Provider  oxyCODONE-acetaminophen (PERCOCET/ROXICET) 5-325 MG tablet Take 1 tablet by mouth every 4 (four) hours as needed for severe pain. 01/01/21  Yes Irean Hong, MD  amLODipine (NORVASC) 10 MG tablet Take 10 mg by mouth daily. 09/24/17   [provider]  amoxicillin-clavulanate (AUGMENTIN) 875-125 MG tablet Take 1 tablet by mouth  every 12 (twelve) hours. 12/03/17   Salary, Evelena Asa, MD  ATROVENT HFA 17 MCG/ACT inhaler Inhale 2 puffs into the lungs every 4 (four) hours as needed for wheezing. 12/03/17   Salary, Jetty Duhamel D, MD  erythromycin ophthalmic ointment Place into the right eye every 6 (six) hours. 12/03/17   Salary, Evelena Asa, MD  glipiZIDE (GLUCOTROL XL) 10 MG 24 hr tablet Take 10 mg by mouth daily. 09/24/17   [provider]  Ipratropium-Albuterol (COMBIVENT RESPIMAT) 20-100 MCG/ACT AERS respimat Inhale 1 puff into the lungs every 6 (six) hours. 12/03/17   Salary, Evelena Asa, MD  lactobacillus (FLORANEX/LACTINEX) PACK Take 1 packet (1 g total) by mouth 3 (three) times daily with meals. 12/03/17   Salary, Evelena Asa, MD  loperamide (IMODIUM) 2 MG capsule Take 1 capsule (2 mg total) by mouth as needed for diarrhea or loose stools. 12/03/17   Salary, Evelena Asa, MD  metFORMIN (GLUCOPHAGE) 1000 MG tablet Take 1,000 mg by mouth 2 (two) times daily with a meal.    [provider]  Multiple Vitamin (MULTIVITAMIN WITH MINERALS) TABS tablet Take 1 tablet by mouth daily. 12/04/17   Salary, Evelena Asa, MD  oseltamivir (TAMIFLU) 30 MG capsule Take 1 capsule (30 mg total) by mouth 2 (two) times daily. 12/03/17   Salary, Evelena Asa, MD  predniSONE (DELTASONE) 50 MG tablet Take 0.5 tablets (25 mg total) by mouth daily with breakfast. 12/04/17   Salary, Jetty Duhamel  D, MD  protein supplement shake (PREMIER PROTEIN) LIQD Take 325 mLs (11 oz total) by mouth 2 (two) times daily between meals. 12/03/17   Salary, Evelena Asa, MD  TRADJENTA 5 MG TABS tablet Take 5 mg by mouth daily. 09/25/17   [provider]    Allergies Codeine and Sulfa antibiotics  History reviewed. No pertinent family history.  Social History Social History   Tobacco Use  . Smoking status: Never Smoker  . Smokeless tobacco: Never Used  Substance Use Topics  . Alcohol use: No  . Drug use: No    Review of Systems  Constitutional: No fever/chills Eyes:  No visual changes. ENT: No sore throat. Cardiovascular: Denies chest pain. Respiratory: Denies shortness of breath. Gastrointestinal: No abdominal pain.  No nausea, no vomiting.  No diarrhea.  No constipation. Genitourinary: Negative for dysuria. Musculoskeletal: Positive for right knee pain.  Negative for back pain. Skin: Negative for rash. Neurological: Negative for headaches, focal weakness or numbness.   ____________________________________________   PHYSICAL EXAM:  VITAL SIGNS: ED Triage Vitals  Enc Vitals Group     BP 12/31/20 2215 (!) 195/88     Pulse Rate 12/31/20 2215 91     Resp 12/31/20 2215 18     Temp --      Temp src --      SpO2 12/31/20 2215 97 %     Weight 12/31/20 2210 170 lb (77.1 kg)     Height 12/31/20 2210 5' 3.5" (1.613 m)     Head Circumference --      Peak Flow --      Pain Score 12/31/20 2210 10     Pain Loc --      Pain Edu? --      Excl. in GC? --     Constitutional: Alert and oriented.  Elderly appearing and in mild acute distress. Eyes: Conjunctivae are normal. PERRL. EOMI. Head: Atraumatic. Nose: No congestion/rhinnorhea. Mouth/Throat: Mucous membranes are moist.   Neck: No stridor.   Cardiovascular: Normal rate, regular rhythm. Grossly normal heart sounds.  Good peripheral circulation. Respiratory: Normal respiratory effort.  No retractions. Lungs CTAB. Gastrointestinal: Soft and nontender. No distention. No abdominal bruits. No CVA tenderness. Musculoskeletal:  Right knee: Mild to moderate effusion.  Limited range of motion secondary to pain.  Tender to palpation behind right knee and calf.  Right lower leg mildly swollen compared to the left.  2+ distal pulses.  Brisk, less than 5-second capillary refill. Neurologic:  Normal speech and language. No gross focal neurologic deficits are appreciated.  Skin:  Skin is warm, dry and intact. No rash noted. Psychiatric: Mood and affect are normal. Speech and behavior are  normal.  ____________________________________________   LABS (all labs ordered are listed, but only abnormal results are displayed)  Labs Reviewed  COMPREHENSIVE METABOLIC PANEL - Abnormal; Notable for the following components:      Result Value   Glucose, Bld 148 (*)    AST 45 (*)    All other components within normal limits  LACTIC ACID, PLASMA  CBC WITH DIFFERENTIAL/PLATELET   ____________________________________________  EKG  None ____________________________________________  RADIOLOGY I, Kevona Lupinacci J, personally viewed and evaluated these images (plain radiographs) as part of my medical decision making, as well as reviewing the written report by the radiologist.  ED MD interpretation: Mild degenerative changes with moderate joint effusions;   Official radiology report(s): US Venous Img Lower Unilateral Right (DVT)  Result Date: 01/01/2021 CLINICAL DATA:  Right lower extremity pain  and swelling EXAM: RIGHT LOWER EXTREMITY VENOUS DOPPLER ULTRASOUND TECHNIQUE: Gray-scale sonography with compression, as well as color and duplex ultrasound, were performed to evaluate the deep venous system(s) from the level of the common femoral vein through the popliteal and proximal calf veins. COMPARISON:  None. FINDINGS: VENOUS Normal compressibility of the common femoral, superficial femoral, and popliteal veins, as well as the visualized calf veins. Visualized portions of profunda femoral vein and great saphenous vein unremarkable. No filling defects to suggest DVT on grayscale or color Doppler imaging. Doppler waveforms show normal direction of venous flow, normal respiratory plasticity and response to augmentation. Limited views of the contralateral common femoral vein are unremarkable. OTHER None. Limitations: none IMPRESSION: Negative. Electronically Signed   By: Charlett NoseKevin  Dover M.D.   On: 01/01/2021 03:33   DG Knee Complete 4 Views Right  Result Date: 01/01/2021 CLINICAL DATA:  Knee pain  EXAM: RIGHT KNEE - COMPLETE 4+ VIEW COMPARISON:  None. FINDINGS: Mild degenerative changes in the right knee with joint space narrowing and spurring in all 3 compartments. Moderate joint effusion. No acute bony abnormality. Specifically, no fracture, subluxation, or dislocation. IMPRESSION: Mild degenerative changes with moderate joint effusion. No acute bony abnormality. Electronically Signed   By: Charlett NoseKevin  Dover M.D.   On: 01/01/2021 01:30    ____________________________________________   PROCEDURES  Procedure(s) performed (including Critical Care):  Procedures   ____________________________________________   INITIAL IMPRESSION / ASSESSMENT AND PLAN / ED COURSE  As part of my medical decision making, I reviewed the following data within the electronic MEDICAL RECORD NUMBER Nursing notes reviewed and incorporated, Labs reviewed, Old chart reviewed, Radiograph reviewed, Notes from prior ED visits and [place] Controlled Substance Database     80 year old female who presents with a 1 month history of nontraumatic right knee pain, currently on Keflex.  Differential diagnosis includes but is not limited to bursitis, septic joint, DVT, pain secondary to effusion, etc.  Laboratory results unremarkable including unremarkable lactic acid.  Clinically does not appear to have septic joint.  Will obtain DVT ultrasound.  Administer IM Toradol, Percocet which patient states she has had in the past and just needs a Benadryl for itching, Ace wrap and reassess.    Clinical Course as of 01/01/21 [id number]  Wynelle LinkSun Jan 01, 2021  0403 Updated patient and family member of negative ultrasound.  Will Ace wrap right knee, discharge home with prescription for Percocet to take instead of tramadol.  Patient will follow up with orthopedics.  Strict return precautions given.  Patient verbalizes understanding agrees with plan of care. [JS]    Clinical Course User Index [JS] Irean HongSung, Yader Criger J, MD      ____________________________________________   FINAL CLINICAL IMPRESSION(S) / ED DIAGNOSES  Final diagnoses:  Acute pain of right knee  Bursitis of right knee, unspecified bursa     ED Discharge Orders         Ordered    oxyCODONE-acetaminophen (PERCOCET/ROXICET) 5-325 MG tablet  Every 4 hours PRN        01/01/21 0404          *Please note:  Kendra Clarke was evaluated in Emergency Department on 01/01/2021 for the symptoms described in the history of present illness. She was evaluated in the context of the global COVID-19 pandemic, which necessitated consideration that the patient might be at risk for infection with the SARS-CoV-2 virus that causes COVID-19. Institutional protocols and algorithms that pertain to the evaluation of patients at risk for COVID-19 are in a state  of rapid change based on information released by regulatory bodies including the CDC and federal and state organizations. These policies and algorithms were followed during the patient's care in the ED.  Some ED evaluations and interventions may be delayed as a result of limited staffing during and the pandemic.*   Note:  This document was prepared using Dragon voice recognition software and may include unintentional dictation errors.   Irean Hong, MD 01/01/21 [phone]

## 2021-01-01 NOTE — ED Notes (Signed)
MD notified  

## 2021-01-13 ENCOUNTER — Other Ambulatory Visit: Payer: Self-pay

## 2021-01-13 ENCOUNTER — Ambulatory Visit (INDEPENDENT_AMBULATORY_CARE_PROVIDER_SITE_OTHER): Payer: Medicare Other | Admitting: Vascular Surgery

## 2021-01-13 ENCOUNTER — Encounter (INDEPENDENT_AMBULATORY_CARE_PROVIDER_SITE_OTHER): Payer: Self-pay | Admitting: Vascular Surgery

## 2021-01-13 DIAGNOSIS — I809 Phlebitis and thrombophlebitis of unspecified site: Secondary | ICD-10-CM | POA: Insufficient documentation

## 2021-01-13 DIAGNOSIS — I83813 Varicose veins of bilateral lower extremities with pain: Secondary | ICD-10-CM | POA: Diagnosis not present

## 2021-01-13 DIAGNOSIS — I10 Essential (primary) hypertension: Secondary | ICD-10-CM

## 2021-01-13 DIAGNOSIS — I8001 Phlebitis and thrombophlebitis of superficial vessels of right lower extremity: Secondary | ICD-10-CM | POA: Diagnosis not present

## 2021-01-13 DIAGNOSIS — E119 Type 2 diabetes mellitus without complications: Secondary | ICD-10-CM

## 2021-01-13 NOTE — Patient Instructions (Signed)
Varicose Veins Varicose veins are veins that have become enlarged, bulged, and twisted. They most often appear in the legs. What are the causes? This condition is caused by damage to the valves in the vein. These valves help blood return to your heart. When they are damaged and they stop working properly, blood may flow backward and back up in the veins near the skin, causing the veins to get larger and appear twisted. The condition can result from any issue that causes blood to back up, like pregnancy, prolonged standing, or obesity. What increases the risk? This condition is more likely to develop in people who are:  On their feet a lot.  Pregnant.  Overweight. What are the signs or symptoms? Symptoms of this condition include:  Bulging, twisted, and bluish veins.  A feeling of heaviness. This may be worse at the end of the day.  Leg pain. This may be worse at the end of the day.  Swelling in the leg.  Changes in skin color over the veins. How is this diagnosed? This condition may be diagnosed based on your symptoms, a physical exam, and an ultrasound test. How is this treated? Treatment for this condition may involve:  Avoiding sitting or standing in one position for long periods of time.  Wearing compression stockings. These stockings help to prevent blood clots and reduce swelling in the legs.  Raising (elevating) the legs when resting.  Losing weight.  Exercising regularly. If you have persistent symptoms or want to improve the way your varicose veins look, you may choose to have a procedure to close the varicose veins off or to remove them. Treatments to close off the veins include:  Sclerotherapy. In this treatment, a solution is injected into a vein to close it off.  Laser treatment. In this treatment, the vein is heated with a laser to close it off.  Radiofrequency vein ablation. In this treatment, an electrical current produced by radio waves is used to close  off the vein. Treatments to remove the veins include:  Phlebectomy. In this treatment, the veins are removed through small incisions made over the veins.  Vein ligation and stripping. In this treatment, incisions are made over the veins. The veins are then removed after being tied (ligated) with stitches (sutures). Follow these instructions at home: Activity  Walk as much as possible. Walking increases blood flow. This helps blood return to the heart and takes pressure off your veins. It also increases your cardiovascular strength.  Follow your health care provider's instructions about exercising.  Do not stand or sit in one position for a long period of time.  Do not sit with your legs crossed.  Rest with your legs raised during the day. General instructions  Follow any diet instructions given to you by your health care provider.  Wear compression stockings as directed by your health care provider. Do not wear other kinds of tight clothing around your legs, pelvis, or waist.  Elevate your legs at night to above the level of your heart.  If you get a cut in the skin over the varicose vein and the vein bleeds: ? Lie down with your leg raised. ? Apply firm pressure to the cut with a clean cloth until the bleeding stops. ? Place a bandage (dressing) on the cut.   Contact a health care provider if:  The skin around your varicose veins starts to break down.  You have pain, redness, tenderness, or hard swelling over a vein.    You are uncomfortable because of pain.  You get a cut in the skin over a varicose vein and it will not stop bleeding. Summary  Varicose veins are veins that have become enlarged, bulged, and twisted. They most often appear in the legs.  This condition is caused by damage to the valves in the vein. These valves help blood return to your heart.  Treatment for this condition includes frequent movements, wearing compression stockings, losing weight, and  exercising regularly. In some cases, procedures are done to close off or remove the veins.  Treatment for this condition may include wearing compression stockings, elevating the legs, losing weight, and engaging in regular activity. In some cases, procedures are done to close off or remove the veins. This information is not intended to replace advice given to you by your health care provider. Make sure you discuss any questions you have with your health care provider. Document Revised: 01/14/2020 Document Reviewed: 01/14/2020 Elsevier Patient Education  2021 [employer]  

## 2021-01-13 NOTE — Assessment & Plan Note (Signed)
blood pressure control important in reducing the progression of atherosclerotic disease. On appropriate oral medications.  

## 2021-01-13 NOTE — Assessment & Plan Note (Signed)
blood glucose control important in reducing the progression of atherosclerotic disease. Also, involved in wound healing. On appropriate medications.  

## 2021-01-13 NOTE — Progress Notes (Signed)
[name], female   DOB: Dec 22, 1940, 80 y.o.   MRN: [id number]  Chief Complaint  Patient presents with  . New Patient (Initial Visit)    Ref Odis Luster asymptomatic varicose veins    HPI Kendra Clarke is a 80 y.o. female.  I am asked to see the patient by Dr. Odis Luster for evaluation of painful varicose veins and what sounds like a superficial thrombophlebitis of some large varicosities on the right lateral calf area.  These became inflamed and she was started on medications which have significantly improved the area although they remain tender to the touch.  They are no longer red and extremely painful.  She has some varicosities on both legs but a little more diffuse on the right than the left.  The patient reports no previous history of DVT or superficial thrombophlebitis to her knowledge.  She does have some leg swelling.  She has a very bad right knee and needs orthopedic attention for this, this episode with the veins on her right leg has held that up.     Past Medical History:  Diagnosis Date  . Arthritis   . Asthma   . COPD (chronic obstructive pulmonary disease) (HCC)   . Diabetes mellitus without complication (HCC)   . Hypertension   . Liver disease   . Umbilical hernia     Past Surgical History:  Procedure Laterality Date  . ABDOMINAL HYSTERECTOMY    . BLADDER SURGERY    . CHOLECYSTECTOMY    . nose surgery       Family History  Problem Relation Age of Onset  . Multiple sclerosis Mother   . Hypertension Father   . COPD Father   . Varicose Veins Maternal Aunt   . Stroke Nephew      Social History   Tobacco Use  . Smoking status: Never Smoker  . Smokeless tobacco: Never Used  Substance Use Topics  . Alcohol use: No  . Drug use: No     Allergies  Allergen Reactions  . Albuterol Anaphylaxis    Other reaction(s): Other (See Comments) Patient states it gives her an asthma attack  . Codeine Anxiety  . Sulfa Antibiotics Rash    Current  Outpatient Medications  Medication Sig Dispense Refill  . amLODipine (NORVASC) 10 MG tablet Take 10 mg by mouth daily.    . ATROVENT HFA 17 MCG/ACT inhaler Inhale 2 puffs into the lungs every 4 (four) hours as needed for wheezing. 1 Inhaler 1  . Dulaglutide (TRULICITY) 0.75 MG/0.5ML SOPN     . hydrochlorothiazide (MICROZIDE) 12.5 MG capsule 1 by mouth daily for high blood pressure    . lisinopril (ZESTRIL) 20 MG tablet Take by mouth.    . metFORMIN (GLUCOPHAGE) 1000 MG tablet Take 1,000 mg by mouth 2 (two) times daily with a meal.    . rosuvastatin (CRESTOR) 10 MG tablet Take 10 mg by mouth daily.    . SYMBICORT 160-4.5 MCG/ACT inhaler INHALE 2 PUFFS TWICE DAILY FOR ASTHMA    . amoxicillin-clavulanate (AUGMENTIN) 875-125 MG tablet Take 1 tablet by mouth every 12 (twelve) hours. (Patient not taking: Reported on 01/13/2021) 8 tablet 0  . erythromycin ophthalmic ointment Place into the right eye every 6 (six) hours. (Patient not taking: Reported on 01/13/2021) 3.5 g 0  . glipiZIDE (GLUCOTROL XL) 10 MG 24 hr tablet Take 10 mg by mouth daily. (Patient not taking: Reported on 01/13/2021)    . Ipratropium-Albuterol (COMBIVENT RESPIMAT) 20-100 MCG/ACT  AERS respimat Inhale 1 puff into the lungs every 6 (six) hours. (Patient not taking: Reported on 01/13/2021) 1 Inhaler 1  . lactobacillus (FLORANEX/LACTINEX) PACK Take 1 packet (1 g total) by mouth 3 (three) times daily with meals. (Patient not taking: Reported on 01/13/2021) 20 packet 0  . loperamide (IMODIUM) 2 MG capsule Take 1 capsule (2 mg total) by mouth as needed for diarrhea or loose stools. (Patient not taking: Reported on 01/13/2021) 10 capsule 0  . Multiple Vitamin (MULTIVITAMIN WITH MINERALS) TABS tablet Take 1 tablet by mouth daily. (Patient not taking: Reported on 01/13/2021) 180 tablet 0  . oseltamivir (TAMIFLU) 30 MG capsule Take 1 capsule (30 mg total) by mouth 2 (two) times daily. (Patient not taking: Reported on 01/13/2021) 6 capsule 0  .  oxyCODONE-acetaminophen (PERCOCET/ROXICET) 5-325 MG tablet Take 1 tablet by mouth every 4 (four) hours as needed for severe pain. (Patient not taking: Reported on 01/13/2021) 15 tablet 0  . predniSONE (DELTASONE) 50 MG tablet Take 0.5 tablets (25 mg total) by mouth daily with breakfast. (Patient not taking: Reported on 01/13/2021) 7 tablet 0  . protein supplement shake (PREMIER PROTEIN) LIQD Take 325 mLs (11 oz total) by mouth 2 (two) times daily between meals. (Patient not taking: Reported on 01/13/2021) 90 Can 0  . TRADJENTA 5 MG TABS tablet Take 5 mg by mouth daily. (Patient not taking: Reported on 01/13/2021)     No current facility-administered medications for this visit.      REVIEW OF SYSTEMS (Negative unless checked)  Constitutional: [] Weight loss  [] Fever  [] Chills Cardiac: [] Chest pain   [] Chest pressure   [] Palpitations   [] Shortness of breath when laying flat   [] Shortness of breath at rest   [] Shortness of breath with exertion. Vascular:  [] Pain in legs with walking   [] Pain in legs at rest   [] Pain in legs when laying flat   [] Claudication   [] Pain in feet when walking  [] Pain in feet at rest  [] Pain in feet when laying flat   [] History of DVT   [x] Phlebitis   [x] Swelling in legs   [x] Varicose veins   [] Non-healing ulcers Pulmonary:   [] Uses home oxygen   [] Productive cough   [] Hemoptysis   [] Wheeze  [x] COPD   [] Asthma Neurologic:  [] Dizziness  [] Blackouts   [] Seizures   [] History of stroke   [] History of TIA  [] Aphasia   [] Temporary blindness   [] Dysphagia   [] Weakness or numbness in arms   [] Weakness or numbness in legs Musculoskeletal:  [x] Arthritis   [] Joint swelling   [x] Joint pain   [] Low back pain Hematologic:  [] Easy bruising  [] Easy bleeding   [] Hypercoagulable state   [] Anemic  [] Hepatitis Gastrointestinal:  [] Blood in stool   [] Vomiting blood  [] Gastroesophageal reflux/heartburn   [] Abdominal pain Genitourinary:  [] Chronic kidney disease   [] Difficult urination  [] Frequent  urination  [] Burning with urination   [] Hematuria Skin:  [] Rashes   [] Ulcers   [] Wounds Psychological:  [] History of anxiety   []  History of major depression.    Physical Exam BP (!) 202/84 (BP Location: Left Arm)   Pulse 99   Resp 16   Ht 5\' 4"  (1.626 m)   Wt 180 lb (81.6 kg)   BMI 30.90 kg/m  Gen:  WD/WN, NAD. Appears younger than stated age. Head: [place]/AT, No temporalis wasting. Ear/Nose/Throat: Hearing grossly intact, nares w/o erythema or drainage, oropharynx w/o Erythema/Exudate Eyes: Conjunctiva clear, sclera non-icteric  Neck: trachea midline.  No JVD.  Pulmonary:  Good air movement, respirations not labored, no use of accessory muscles  Cardiac: RRR, no JVD Vascular:  Vessel Right Left  Radial Palpable Palpable                                   Gastrointestinal:. No masses, surgical incisions, or scars. Musculoskeletal: M/S 5/5 throughout.  Extremities without ischemic changes.  No deformity or atrophy.  Fairly extensive varicosities with the ones on the right lateral knee and calf area tender to the touch and somewhat firm.  Varicosities are present bilaterally.  Mild bilateral lower extremity edema. Neurologic: Sensation grossly intact in extremities.  Symmetrical.  Speech is fluent. Motor exam as listed above. Psychiatric: Judgment intact, Mood & affect appropriate for pt's clinical situation. Dermatologic: No rashes or ulcers noted.  No cellulitis or open wounds.    Radiology US Venous Img Lower Unilateral Right (DVT)  Result Date: 01/01/2021 CLINICAL DATA:  Right lower extremity pain and swelling EXAM: RIGHT LOWER EXTREMITY VENOUS DOPPLER ULTRASOUND TECHNIQUE: Gray-scale sonography with compression, as well as color and duplex ultrasound, were performed to evaluate the deep venous system(s) from the level of the common femoral vein through the popliteal and proximal calf veins. COMPARISON:  None. FINDINGS: VENOUS Normal compressibility of the common femoral,  superficial femoral, and popliteal veins, as well as the visualized calf veins. Visualized portions of profunda femoral vein and great saphenous vein unremarkable. No filling defects to suggest DVT on grayscale or color Doppler imaging. Doppler waveforms show normal direction of venous flow, normal respiratory plasticity and response to augmentation. Limited views of the contralateral common femoral vein are unremarkable. OTHER None. Limitations: none IMPRESSION: Negative. Electronically Signed   By: Charlett Nose M.D.   On: 01/01/2021 03:33   DG Knee Complete 4 Views Right  Result Date: 01/01/2021 CLINICAL DATA:  Knee pain EXAM: RIGHT KNEE - COMPLETE 4+ VIEW COMPARISON:  None. FINDINGS: Mild degenerative changes in the right knee with joint space narrowing and spurring in all 3 compartments. Moderate joint effusion. No acute bony abnormality. Specifically, no fracture, subluxation, or dislocation. IMPRESSION: Mild degenerative changes with moderate joint effusion. No acute bony abnormality. Electronically Signed   By: Charlett Nose M.D.   On: 01/01/2021 01:30    Labs Recent Results (from the past 2160 hour(s))  Lactic acid, plasma     Status: None   Collection Time: 12/31/20 10:22 PM  Result Value Ref Range   Lactic Acid, Venous 1.2 0.5 - 1.9 mmol/L    Comment: Performed at [facility], [address] [place], [place] [address]  Comprehensive metabolic panel     Status: Abnormal   Collection Time: 12/31/20 10:22 PM  Result Value Ref Range   Sodium 138 135 - 145 mmol/L   Potassium 3.5 3.5 - 5.1 mmol/L   Chloride 102 98 - 111 mmol/L   CO2 27 22 - 32 mmol/L   Glucose, Bld 148 (H) 70 - 99 mg/dL    Comment: Glucose reference range applies only to samples taken after fasting for at least 8 hours.   BUN 12 8 - 23 mg/dL   Creatinine, Ser 7.20 0.44 - 1.00 mg/dL   Calcium 9.1 8.9 - 94.7 mg/dL   Total Protein 7.4 6.5 - 8.1 g/dL   Albumin 3.7 3.5 - 5.0 g/dL   AST 45 (H) 15 - 41 U/L   ALT  28 0 - 44 U/L   Alkaline  Phosphatase 90 38 - 126 U/L   Total Bilirubin 1.2 0.3 - 1.2 mg/dL   GFR, Estimated >16>60 >10>60 mL/min    Comment: (NOTE) Calculated using the CKD-EPI Creatinine Equation (2021)    Anion gap 9 5 - 15    Comment: Performed at [facility], [address] [place], [place] [address]  CBC with Differential     Status: None   Collection Time: 12/31/20 10:22 PM  Result Value Ref Range   WBC 6.7 4.0 - 10.5 K/uL   RBC 4.52 3.87 - 5.11 MIL/uL   Hemoglobin 13.8 12.0 - 15.0 g/dL   HCT 40.941.9 81.136.0 - 91.446.0 %   MCV 92.7 80.0 - 100.0 fL   MCH 30.5 26.0 - 34.0 pg   MCHC 32.9 30.0 - 36.0 g/dL   RDW 78.213.1 95.611.5 - 21.315.5 %   Platelets 156 150 - 400 K/uL   nRBC 0.0 0.0 - 0.2 %   Neutrophils Relative % 65 %   Neutro Abs 4.4 1.7 - 7.7 K/uL   Lymphocytes Relative 21 %   Lymphs Abs 1.4 0.7 - 4.0 K/uL   Monocytes Relative 9 %   Monocytes Absolute 0.6 0.1 - 1.0 K/uL   Eosinophils Relative 4 %   Eosinophils Absolute 0.3 0.0 - 0.5 K/uL   Basophils Relative 1 %   Basophils Absolute 0.1 0.0 - 0.1 K/uL   Immature Granulocytes 0 %   Abs Immature Granulocytes 0.01 0.00 - 0.07 K/uL    Comment: Performed at [facility], [address] [place], [place] [address]    Assessment/Plan:  No problem-specific Assessment & Plan notes found for this encounter.      Festus BarrenJason Gyanna Jarema 01/13/2021, 10:07 AM   This note was created with Dragon medical transcription system.  Any errors from dictation are unintentional.

## 2021-01-13 NOTE — Assessment & Plan Note (Signed)
The patient has a resolving case of superficial thrombophlebitis on the right leg.  She has no chest pain or shortness of breath.  No indication of proximal extension.  Previous negative DVT study.  This was in a branch and not one of the major superficial veins.  Continue warm compresses and elevation.  No role for anticoagulation at this point

## 2021-01-13 NOTE — Assessment & Plan Note (Signed)
Recommend:  The patient has large symptomatic varicose veins that are painful and associated with swelling.  I have had a long discussion with the patient regarding  varicose veins and why they cause symptoms.  Patient will begin wearing graduated compression stockings class 1 on a daily basis, beginning first thing in the morning and removing them in the evening. The patient is instructed specifically not to sleep in the stockings.    The patient  will also begin using over-the-counter analgesics such as Motrin 600 mg po TID to help control the symptoms.    In addition, behavioral modification including elevation during the day will be initiated.    Pending the results of these changes the  patient will be reevaluated in three months.   An  ultrasound of the venous system will be obtained.   Further plans will be based on the ultrasound results and whether conservative therapies are successful at eliminating the pain and swelling.  

## 2021-02-07 ENCOUNTER — Other Ambulatory Visit: Payer: Self-pay

## 2021-02-07 ENCOUNTER — Ambulatory Visit (INDEPENDENT_AMBULATORY_CARE_PROVIDER_SITE_OTHER): Payer: Medicare Other | Admitting: Vascular Surgery

## 2021-02-07 ENCOUNTER — Ambulatory Visit (INDEPENDENT_AMBULATORY_CARE_PROVIDER_SITE_OTHER): Payer: Medicare Other

## 2021-02-07 DIAGNOSIS — I83813 Varicose veins of bilateral lower extremities with pain: Secondary | ICD-10-CM

## 2021-02-07 DIAGNOSIS — I8001 Phlebitis and thrombophlebitis of superficial vessels of right lower extremity: Secondary | ICD-10-CM

## 2021-03-29 ENCOUNTER — Telehealth (INDEPENDENT_AMBULATORY_CARE_PROVIDER_SITE_OTHER): Payer: Self-pay | Admitting: Vascular Surgery

## 2021-03-29 NOTE — Telephone Encounter (Signed)
Patient was giving results by Sheppard Plumber NP and will follow up prn. The patient was advise to elevate above heart level and wear compression.

## 2021-03-29 NOTE — Telephone Encounter (Signed)
Patient left vm stating she hasn't gotten call/letter from her appointment that was done on May 24.  Please advise

## 2021-11-12 ENCOUNTER — Emergency Department: Payer: Medicare HMO

## 2021-11-12 ENCOUNTER — Inpatient Hospital Stay
Admission: EM | Admit: 2021-11-12 | Discharge: 2021-11-21 | DRG: 871 | Discharge status: Against Medical Advice | Payer: Medicare HMO | Attending: Student in an Organized Health Care Education/Training Program | Admitting: Student in an Organized Health Care Education/Training Program

## 2021-11-12 ENCOUNTER — Encounter: Payer: Self-pay | Admitting: Emergency Medicine

## 2021-11-12 ENCOUNTER — Other Ambulatory Visit: Payer: Self-pay

## 2021-11-12 DIAGNOSIS — E119 Type 2 diabetes mellitus without complications: Secondary | ICD-10-CM | POA: Diagnosis not present

## 2021-11-12 DIAGNOSIS — I248 Other forms of acute ischemic heart disease: Secondary | ICD-10-CM | POA: Diagnosis present

## 2021-11-12 DIAGNOSIS — Z7951 Long term (current) use of inhaled steroids: Secondary | ICD-10-CM

## 2021-11-12 DIAGNOSIS — R609 Edema, unspecified: Secondary | ICD-10-CM

## 2021-11-12 DIAGNOSIS — D72818 Other decreased white blood cell count: Secondary | ICD-10-CM | POA: Diagnosis present

## 2021-11-12 DIAGNOSIS — K7460 Unspecified cirrhosis of liver: Secondary | ICD-10-CM | POA: Diagnosis present

## 2021-11-12 DIAGNOSIS — E785 Hyperlipidemia, unspecified: Secondary | ICD-10-CM | POA: Diagnosis present

## 2021-11-12 DIAGNOSIS — J40 Bronchitis, not specified as acute or chronic: Secondary | ICD-10-CM

## 2021-11-12 DIAGNOSIS — W19XXXA Unspecified fall, initial encounter: Secondary | ICD-10-CM

## 2021-11-12 DIAGNOSIS — R778 Other specified abnormalities of plasma proteins: Secondary | ICD-10-CM | POA: Diagnosis present

## 2021-11-12 DIAGNOSIS — Z825 Family history of asthma and other chronic lower respiratory diseases: Secondary | ICD-10-CM

## 2021-11-12 DIAGNOSIS — J449 Chronic obstructive pulmonary disease, unspecified: Secondary | ICD-10-CM | POA: Diagnosis present

## 2021-11-12 DIAGNOSIS — J441 Chronic obstructive pulmonary disease with (acute) exacerbation: Secondary | ICD-10-CM | POA: Diagnosis present

## 2021-11-12 DIAGNOSIS — Z8249 Family history of ischemic heart disease and other diseases of the circulatory system: Secondary | ICD-10-CM

## 2021-11-12 DIAGNOSIS — Z823 Family history of stroke: Secondary | ICD-10-CM

## 2021-11-12 DIAGNOSIS — R109 Unspecified abdominal pain: Secondary | ICD-10-CM

## 2021-11-12 DIAGNOSIS — U071 COVID-19: Secondary | ICD-10-CM | POA: Diagnosis present

## 2021-11-12 DIAGNOSIS — R112 Nausea with vomiting, unspecified: Secondary | ICD-10-CM | POA: Diagnosis present

## 2021-11-12 DIAGNOSIS — N179 Acute kidney failure, unspecified: Secondary | ICD-10-CM | POA: Diagnosis present

## 2021-11-12 DIAGNOSIS — G9340 Encephalopathy, unspecified: Secondary | ICD-10-CM | POA: Diagnosis present

## 2021-11-12 DIAGNOSIS — Z7984 Long term (current) use of oral hypoglycemic drugs: Secondary | ICD-10-CM

## 2021-11-12 DIAGNOSIS — E878 Other disorders of electrolyte and fluid balance, not elsewhere classified: Secondary | ICD-10-CM | POA: Diagnosis present

## 2021-11-12 DIAGNOSIS — E871 Hypo-osmolality and hyponatremia: Secondary | ICD-10-CM

## 2021-11-12 DIAGNOSIS — Y92009 Unspecified place in unspecified non-institutional (private) residence as the place of occurrence of the external cause: Secondary | ICD-10-CM

## 2021-11-12 DIAGNOSIS — Z90710 Acquired absence of both cervix and uterus: Secondary | ICD-10-CM

## 2021-11-12 DIAGNOSIS — I10 Essential (primary) hypertension: Secondary | ICD-10-CM | POA: Diagnosis present

## 2021-11-12 DIAGNOSIS — E876 Hypokalemia: Secondary | ICD-10-CM | POA: Diagnosis present

## 2021-11-12 DIAGNOSIS — J069 Acute upper respiratory infection, unspecified: Secondary | ICD-10-CM | POA: Diagnosis present

## 2021-11-12 DIAGNOSIS — A4189 Other specified sepsis: Secondary | ICD-10-CM | POA: Diagnosis present

## 2021-11-12 DIAGNOSIS — R0602 Shortness of breath: Secondary | ICD-10-CM | POA: Diagnosis present

## 2021-11-12 DIAGNOSIS — J209 Acute bronchitis, unspecified: Secondary | ICD-10-CM | POA: Diagnosis present

## 2021-11-12 DIAGNOSIS — D6959 Other secondary thrombocytopenia: Secondary | ICD-10-CM | POA: Diagnosis present

## 2021-11-12 DIAGNOSIS — A419 Sepsis, unspecified organism: Secondary | ICD-10-CM

## 2021-11-12 DIAGNOSIS — K5900 Constipation, unspecified: Secondary | ICD-10-CM | POA: Diagnosis present

## 2021-11-12 DIAGNOSIS — J440 Chronic obstructive pulmonary disease with acute lower respiratory infection: Secondary | ICD-10-CM | POA: Diagnosis present

## 2021-11-12 DIAGNOSIS — Z28310 Unvaccinated for covid-19: Secondary | ICD-10-CM

## 2021-11-12 DIAGNOSIS — R0902 Hypoxemia: Secondary | ICD-10-CM

## 2021-11-12 DIAGNOSIS — E11649 Type 2 diabetes mellitus with hypoglycemia without coma: Secondary | ICD-10-CM | POA: Diagnosis present

## 2021-11-12 DIAGNOSIS — J9601 Acute respiratory failure with hypoxia: Secondary | ICD-10-CM | POA: Diagnosis present

## 2021-11-12 DIAGNOSIS — Z79899 Other long term (current) drug therapy: Secondary | ICD-10-CM

## 2021-11-12 LAB — CBC
HCT: 38.4 % (ref 36.0–46.0)
Hemoglobin: 13.4 g/dL (ref 12.0–15.0)
MCH: 29.8 pg (ref 26.0–34.0)
MCHC: 34.9 g/dL (ref 30.0–36.0)
MCV: 85.5 fL (ref 80.0–100.0)
Platelets: 145 10*3/uL — ABNORMAL LOW (ref 150–400)
RBC: 4.49 MIL/uL (ref 3.87–5.11)
RDW: 12.5 % (ref 11.5–15.5)
WBC: 3.9 10*3/uL — ABNORMAL LOW (ref 4.0–10.5)
nRBC: 0 % (ref 0.0–0.2)

## 2021-11-12 LAB — BASIC METABOLIC PANEL
Anion gap: 13 (ref 5–15)
Anion gap: 11 (ref 5–15)
BUN: 9 mg/dL (ref 8–23)
BUN: 8 mg/dL (ref 8–23)
CO2: 28 mmol/L (ref 22–32)
CO2: 25 mmol/L (ref 22–32)
Calcium: 8.5 mg/dL — ABNORMAL LOW (ref 8.9–10.3)
Calcium: 8.3 mg/dL — ABNORMAL LOW (ref 8.9–10.3)
Chloride: 80 mmol/L — ABNORMAL LOW (ref 98–111)
Chloride: 86 mmol/L — ABNORMAL LOW (ref 98–111)
Creatinine, Ser: 0.75 mg/dL (ref 0.44–1.00)
Creatinine, Ser: 0.61 mg/dL (ref 0.44–1.00)
GFR, Estimated: >60 mL/min (ref >60)
GFR, Estimated: >60 mL/min (ref >60)
Glucose, Bld: 129 mg/dL — ABNORMAL HIGH (ref 70–99)
Glucose, Bld: 185 mg/dL — ABNORMAL HIGH (ref 70–99)
Potassium: 2.5 mmol/L — CL (ref 3.5–5.1)
Potassium: 3.5 mmol/L (ref 3.5–5.1)
Sodium: 121 mmol/L — ABNORMAL LOW (ref 135–145)
Sodium: 122 mmol/L — ABNORMAL LOW (ref 135–145)

## 2021-11-12 LAB — RESP PANEL BY RT-PCR (FLU A&B, COVID) ARPGX2
Influenza A by PCR: NEGATIVE
Influenza B by PCR: NEGATIVE
SARS Coronavirus 2 by RT PCR: POSITIVE — AB

## 2021-11-12 LAB — COMPREHENSIVE METABOLIC PANEL
ALT: 34 U/L (ref 0–44)
AST: 66 U/L — ABNORMAL HIGH (ref 15–41)
Albumin: 3 g/dL — ABNORMAL LOW (ref 3.5–5.0)
Alkaline Phosphatase: 81 U/L (ref 38–126)
Anion gap: 9 (ref 5–15)
BUN: 10 mg/dL (ref 8–23)
CO2: 29 mmol/L (ref 22–32)
Calcium: 8.1 mg/dL — ABNORMAL LOW (ref 8.9–10.3)
Chloride: 83 mmol/L — ABNORMAL LOW (ref 98–111)
Creatinine, Ser: 0.74 mg/dL (ref 0.44–1.00)
GFR, Estimated: >60 mL/min (ref >60)
Glucose, Bld: 159 mg/dL — ABNORMAL HIGH (ref 70–99)
Potassium: 2.4 mmol/L — CL (ref 3.5–5.1)
Sodium: 121 mmol/L — ABNORMAL LOW (ref 135–145)
Total Bilirubin: 0.8 mg/dL (ref 0.3–1.2)
Total Protein: 6.3 g/dL — ABNORMAL LOW (ref 6.5–8.1)

## 2021-11-12 LAB — TROPONIN I (HIGH SENSITIVITY)
Troponin I (High Sensitivity): 21 ng/L — ABNORMAL HIGH (ref <18)
Troponin I (High Sensitivity): 19 ng/L — ABNORMAL HIGH (ref <18)

## 2021-11-12 LAB — URINALYSIS, ROUTINE W REFLEX MICROSCOPIC
Bacteria, UA: NONE SEEN
Bilirubin Urine: NEGATIVE
Glucose, UA: NEGATIVE mg/dL
Hgb urine dipstick: NEGATIVE
Ketones, ur: NEGATIVE mg/dL
Nitrite: NEGATIVE
Protein, ur: NEGATIVE mg/dL
Specific Gravity, Urine: 1.014 (ref 1.005–1.030)
pH: 6 (ref 5.0–8.0)

## 2021-11-12 LAB — HEMOGLOBIN A1C
Hgb A1c MFr Bld: 6 % — ABNORMAL HIGH (ref 4.8–5.6)
Mean Plasma Glucose: 125.5 mg/dL

## 2021-11-12 LAB — OSMOLALITY: Osmolality: 258 mOsm/kg — ABNORMAL LOW (ref 275–295)

## 2021-11-12 LAB — GLUCOSE, CAPILLARY
Glucose-Capillary: 226 mg/dL — ABNORMAL HIGH (ref 70–99)
Glucose-Capillary: 211 mg/dL — ABNORMAL HIGH (ref 70–99)

## 2021-11-12 LAB — MAGNESIUM: Magnesium: 1.2 mg/dL — ABNORMAL LOW (ref 1.7–2.4)

## 2021-11-12 LAB — CBG MONITORING, ED
Glucose-Capillary: 155 mg/dL — ABNORMAL HIGH (ref 70–99)
Glucose-Capillary: 174 mg/dL — ABNORMAL HIGH (ref 70–99)

## 2021-11-12 LAB — C-REACTIVE PROTEIN: CRP: 1 mg/dL — ABNORMAL HIGH (ref <1.0)

## 2021-11-12 LAB — LIPASE, BLOOD: Lipase: 43 U/L (ref 11–51)

## 2021-11-12 LAB — STREP PNEUMONIAE URINARY ANTIGEN: Strep Pneumo Urinary Antigen: NEGATIVE

## 2021-11-12 LAB — SODIUM, URINE, RANDOM: Sodium, Ur: 58 mmol/L

## 2021-11-12 LAB — OSMOLALITY, URINE: Osmolality, Ur: 482 mOsm/kg (ref 300–900)

## 2021-11-12 LAB — LACTIC ACID, PLASMA: Lactic Acid, Venous: 1.2 mmol/L (ref 0.5–1.9)

## 2021-11-12 LAB — PROCALCITONIN: Procalcitonin: 0.4 ng/mL

## 2021-11-12 LAB — PHOSPHORUS: Phosphorus: 2.2 mg/dL — ABNORMAL LOW (ref 2.5–4.6)

## 2021-11-12 MED ORDER — FLUTICASONE PROPIONATE HFA 110 MCG/ACT IN AERO
2.0000 | INHALATION_SPRAY | Freq: Two times a day (BID) | RESPIRATORY_TRACT | Status: DC
  Administered 2021-11-12 – 2021-11-21 (×8): 2 via RESPIRATORY_TRACT
  Filled 2021-11-12 (×2): qty 12

## 2021-11-12 MED ORDER — AMLODIPINE BESYLATE 5 MG PO TABS
10.0000 mg | ORAL_TABLET | Freq: Every day | ORAL | Status: DC
Start: 2021-11-13 — End: 2021-11-12

## 2021-11-12 MED ORDER — POTASSIUM CHLORIDE CRYS ER 20 MEQ PO TBCR
40.0000 meq | EXTENDED_RELEASE_TABLET | Freq: Once | ORAL | Status: AC
Start: 2021-11-12 — End: 2021-11-12
  Administered 2021-11-12: 40 meq via ORAL
  Filled 2021-11-12: qty 2

## 2021-11-12 MED ORDER — HYDRALAZINE HCL 20 MG/ML IJ SOLN
5.0000 mg | INTRAMUSCULAR | Status: DC | PRN
  Administered 2021-11-12 – 2021-11-15 (×3): 5 mg via INTRAVENOUS
  Filled 2021-11-12 (×3): qty 1

## 2021-11-12 MED ORDER — SODIUM CHLORIDE 0.9 % IV SOLN: INTRAVENOUS | Status: DC

## 2021-11-12 MED ORDER — LOPERAMIDE HCL 2 MG PO CAPS
2.0000 mg | ORAL_CAPSULE | ORAL | Status: DC | PRN
Start: 2021-11-12 — End: 2021-11-16
  Administered 2021-11-12: 2 mg via ORAL
  Filled 2021-11-12: qty 1

## 2021-11-12 MED ORDER — POTASSIUM CHLORIDE 10 MEQ/100ML IV SOLN
10.0000 meq | INTRAVENOUS | Status: AC
  Administered 2021-11-12 (×6): 10 meq via INTRAVENOUS
  Filled 2021-11-12 (×6): qty 100

## 2021-11-12 MED ORDER — NIRMATRELVIR/RITONAVIR (PAXLOVID) TABLET (RENAL DOSING)
2.0000 | ORAL_TABLET | Freq: Two times a day (BID) | ORAL | Status: AC
Start: 2021-11-12 — End: 2021-11-17
  Administered 2021-11-12 – 2021-11-16 (×9): 2 via ORAL
  Filled 2021-11-12: qty 20

## 2021-11-12 MED ORDER — LACTATED RINGERS IV SOLN: INTRAVENOUS | Status: DC

## 2021-11-12 MED ORDER — MAGNESIUM SULFATE 2 GM/50ML IV SOLN
2.0000 g | Freq: Once | INTRAVENOUS | Status: AC
  Administered 2021-11-12: 2 g via INTRAVENOUS
  Filled 2021-11-12: qty 50

## 2021-11-12 MED ORDER — SODIUM CHLORIDE 0.9 % IV BOLUS: 1000.0000 mL | Freq: Once | INTRAVENOUS | Status: DC

## 2021-11-12 MED ORDER — INSULIN ASPART 100 UNIT/ML IJ SOLN
0.0000 [IU] | Freq: Every day | INTRAMUSCULAR | Status: DC
  Administered 2021-11-13 – 2021-11-14 (×2): 3 [IU] via SUBCUTANEOUS
  Filled 2021-11-12: qty 1

## 2021-11-12 MED ORDER — DIPHENHYDRAMINE HCL 50 MG/ML IJ SOLN
12.5000 mg | Freq: Four times a day (QID) | INTRAMUSCULAR | Status: DC | PRN
  Administered 2021-11-12: 12.5 mg via INTRAVENOUS
  Filled 2021-11-12: qty 1

## 2021-11-12 MED ORDER — SODIUM CHLORIDE 0.9 % IV BOLUS
500.0000 mL | Freq: Once | INTRAVENOUS | Status: AC
  Administered 2021-11-12: 500 mL via INTRAVENOUS

## 2021-11-12 MED ORDER — MOMETASONE FURO-FORMOTEROL FUM 200-5 MCG/ACT IN AERO
2.0000 | INHALATION_SPRAY | Freq: Two times a day (BID) | RESPIRATORY_TRACT | Status: DC
  Filled 2021-11-12: qty 8.8

## 2021-11-12 MED ORDER — METHYLPREDNISOLONE SODIUM SUCC 40 MG IJ SOLR
40.0000 mg | Freq: Two times a day (BID) | INTRAMUSCULAR | Status: AC
  Administered 2021-11-12 – 2021-11-13 (×4): 40 mg via INTRAVENOUS
  Filled 2021-11-12 (×4): qty 1

## 2021-11-12 MED ORDER — ADULT MULTIVITAMIN W/MINERALS CH
1.0000 | ORAL_TABLET | Freq: Every day | ORAL | Status: DC
  Administered 2021-11-12 – 2021-11-21 (×9): 1 via ORAL
  Filled 2021-11-12 (×10): qty 1

## 2021-11-12 MED ORDER — AMLODIPINE BESYLATE 5 MG PO TABS
5.0000 mg | ORAL_TABLET | Freq: Every day | ORAL | Status: DC
  Administered 2021-11-13 – 2021-11-15 (×3): 5 mg via ORAL
  Filled 2021-11-12 (×3): qty 1

## 2021-11-12 MED ORDER — ENOXAPARIN SODIUM 40 MG/0.4ML IJ SOSY
40.0000 mg | PREFILLED_SYRINGE | INTRAMUSCULAR | Status: DC
  Administered 2021-11-12 – 2021-11-21 (×10): 40 mg via SUBCUTANEOUS
  Filled 2021-11-12 (×10): qty 0.4

## 2021-11-12 MED ORDER — ASPIRIN EC 81 MG PO TBEC
81.0000 mg | DELAYED_RELEASE_TABLET | Freq: Every day | ORAL | Status: DC
  Administered 2021-11-12 – 2021-11-21 (×10): 81 mg via ORAL
  Filled 2021-11-12 (×10): qty 1

## 2021-11-12 MED ORDER — INSULIN ASPART 100 UNIT/ML IJ SOLN
0.0000 [IU] | Freq: Three times a day (TID) | INTRAMUSCULAR | Status: DC
  Administered 2021-11-12: 3 [IU] via SUBCUTANEOUS
  Administered 2021-11-12: 2 [IU] via SUBCUTANEOUS
  Administered 2021-11-13: 3 [IU] via SUBCUTANEOUS
  Administered 2021-11-13 (×2): 7 [IU] via SUBCUTANEOUS
  Administered 2021-11-14: 3 [IU] via SUBCUTANEOUS
  Administered 2021-11-14: 7 [IU] via SUBCUTANEOUS
  Administered 2021-11-14: 9 [IU] via SUBCUTANEOUS
  Administered 2021-11-15: 12:00:00 5 [IU] via SUBCUTANEOUS
  Administered 2021-11-15: 3 [IU] via SUBCUTANEOUS
  Administered 2021-11-15: 17:00:00 7 [IU] via SUBCUTANEOUS
  Administered 2021-11-16: 3 [IU] via SUBCUTANEOUS
  Administered 2021-11-16 (×2): 5 [IU] via SUBCUTANEOUS
  Administered 2021-11-17 (×2): 3 [IU] via SUBCUTANEOUS
  Administered 2021-11-17: 2 [IU] via SUBCUTANEOUS
  Administered 2021-11-18: 3 [IU] via SUBCUTANEOUS
  Administered 2021-11-18: 1 [IU] via SUBCUTANEOUS
  Administered 2021-11-18: 2 [IU] via SUBCUTANEOUS
  Administered 2021-11-19: 1 [IU] via SUBCUTANEOUS
  Administered 2021-11-19: 2 [IU] via SUBCUTANEOUS
  Administered 2021-11-20: 1 [IU] via SUBCUTANEOUS
  Administered 2021-11-20: 9 [IU] via SUBCUTANEOUS
  Administered 2021-11-20 – 2021-11-21 (×2): 2 [IU] via SUBCUTANEOUS
  Filled 2021-11-12 (×26): qty 1

## 2021-11-12 MED ORDER — DIPHENHYDRAMINE HCL 50 MG/ML IJ SOLN: 12.5000 mg | Freq: Three times a day (TID) | INTRAMUSCULAR | Status: DC | PRN

## 2021-11-12 MED ORDER — IPRATROPIUM BROMIDE 0.02 % IN SOLN: 2.5000 mL | Freq: Four times a day (QID) | RESPIRATORY_TRACT | Status: DC | PRN

## 2021-11-12 MED ORDER — IPRATROPIUM BROMIDE 0.02 % IN SOLN
2.5000 mL | RESPIRATORY_TRACT | Status: DC
  Administered 2021-11-12: 0.5 mg via RESPIRATORY_TRACT
  Filled 2021-11-12 (×2): qty 2.5

## 2021-11-12 MED ORDER — LACTATED RINGERS IV BOLUS
500.0000 mL | Freq: Once | INTRAVENOUS | Status: AC
  Administered 2021-11-12: 500 mL via INTRAVENOUS

## 2021-11-12 MED ORDER — ACETAMINOPHEN 325 MG PO TABS
650.0000 mg | ORAL_TABLET | Freq: Four times a day (QID) | ORAL | Status: DC | PRN
Start: 2021-11-12 — End: 2021-11-21
  Administered 2021-11-15 – 2021-11-18 (×2): 650 mg via ORAL
  Filled 2021-11-12 (×2): qty 2

## 2021-11-12 NOTE — ED Provider Notes (Signed)
[facility] Provider Note    Event Date/Time   First MD Initiated Contact with Patient 11/12/21 [phone]     (approximate)   History   Emesis, Fall, and Weakness   HPI  Kendra Clarke is a 81 y.o. female   with past medical history of sepsis, diabetes, here with generalized fatigue.  The patient states that over the last 3 weeks, she has had persistent fatigue.  Her symptoms started with a cough, cold, for which she was given antibiotics.  She states she had some transient improvement but over the last 2 weeks, has progressively worsened.  She has had shortness of breath.  She said fatigue.  She had increasing weakness.  She had nausea and difficulty tolerating p.o.  Over the last several days, she has had significant lightheadedness with standing, causing her to fall several times.  Denies any significant pain from her falls.  She states that she has felt short of breath.  He said difficulty taking care of her nephew, for whom she is the primary caregiver.  She subsequent presents for further evaluation.  She states that about 2 weeks ago after she had been recovering from her cold that she was on antibiotics for, she was exposed to another person who had a "virus."  Denies known specific COVID exposures.  No other complaints.      Physical Exam   Triage Vital Signs: ED Triage Vitals  Enc Vitals Group     BP 11/12/21 0557 (!) 155/71     Pulse Rate 11/12/21 0557 70     Resp 11/12/21 0557 (!) 24     Temp 11/12/21 0557 99.5 F (37.5 C)     Temp Source 11/12/21 0557 Oral     SpO2 11/12/21 0557 96 %     Weight 11/12/21 0554 170 lb (77.1 kg)     Height 11/12/21 0554 5\' 4"  (1.626 m)     Head Circumference --      Peak Flow --      Pain Score 11/12/21 0554 0     Pain Loc --      Pain Edu? --      Excl. in GC? --     Most recent vital signs: Vitals:   11/12/21 0557 11/12/21 0800  BP: (!) 155/71 (!) 145/57  Pulse: 70 66  Resp: (!) 24 (!) 22  Temp: 99.5 F  (37.5 C)   SpO2: 96% 99%     General: Awake, no distress.  CV:  Good peripheral perfusion.  No murmurs, rubs, or gallops. Resp:  Normal effort.  Slightly coarse breath sounds, but overall normal aeration and work of breathing.  No rales.  No wheezing. Abd:  No distention.  Soft, nontender, and nondistended. Other:  Mildly dry mucous membranes.     ED Results / Procedures / Treatments   Labs (all labs ordered are listed, but only abnormal results are displayed) Labs Reviewed  RESP PANEL BY RT-PCR (FLU A&B, COVID) ARPGX2 - Abnormal; Notable for the following components:      Result Value   SARS Coronavirus 2 by RT PCR POSITIVE (*)    All other components within normal limits  COMPREHENSIVE METABOLIC PANEL - Abnormal; Notable for the following components:   Sodium 121 (*)    Potassium 2.4 (*)    Chloride 83 (*)    Glucose, Bld 159 (*)    Calcium 8.1 (*)    Total Protein 6.3 (*)    Albumin  3.0 (*)    AST 66 (*)    All other components within normal limits  CBC - Abnormal; Notable for the following components:   WBC 3.9 (*)    Platelets 145 (*)    All other components within normal limits  URINALYSIS, ROUTINE W REFLEX MICROSCOPIC - Abnormal; Notable for the following components:   Color, Urine YELLOW (*)    APPearance CLEAR (*)    Leukocytes,Ua TRACE (*)    All other components within normal limits  OSMOLALITY - Abnormal; Notable for the following components:   Osmolality 258 (*)    All other components within normal limits  MAGNESIUM - Abnormal; Notable for the following components:   Magnesium 1.2 (*)    All other components within normal limits  PHOSPHORUS - Abnormal; Notable for the following components:   Phosphorus 2.2 (*)    All other components within normal limits  BASIC METABOLIC PANEL - Abnormal; Notable for the following components:   Sodium 121 (*)    Potassium 2.5 (*)    Chloride 80 (*)    Glucose, Bld 129 (*)    Calcium 8.5 (*)    All other components  within normal limits  CBG MONITORING, ED - Abnormal; Notable for the following components:   Glucose-Capillary 155 (*)    All other components within normal limits  TROPONIN I (HIGH SENSITIVITY) - Abnormal; Notable for the following components:   Troponin I (High Sensitivity) 21 (*)    All other components within normal limits  TROPONIN I (HIGH SENSITIVITY) - Abnormal; Notable for the following components:   Troponin I (High Sensitivity) 19 (*)    All other components within normal limits  CULTURE, BLOOD (SINGLE)  EXPECTORATED SPUTUM ASSESSMENT W GRAM STAIN, RFLX TO RESP C  LIPASE, BLOOD  LACTIC ACID, PLASMA  OSMOLALITY, URINE  SODIUM, URINE, RANDOM  PROCALCITONIN  BASIC METABOLIC PANEL  HEMOGLOBIN A1C  STREP PNEUMONIAE URINARY ANTIGEN  LEGIONELLA PNEUMOPHILA SEROGP 1 UR AG     EKG Normal sinus rhythm, ventricular rate 73.  PR 142, QRS 134, QTc 506.  No acute ST elevations or depressions.  No EKG evidence of acute ischemia or infarct.   RADIOLOGY Chest x-ray: Mild diffuse peribronchial cuffing, possible bronchitis CT head: No acute intracranial abnormality  I also independently reviewed and agree wit radiologist interpretations.   PROCEDURES:  Critical Care performed: Yes, see critical care procedure note(s)  .Critical Care Performed by: Shaune Pollack, MD Authorized by: Shaune Pollack, MD   Critical care provider statement:    Critical care time (minutes):  30   Critical care time was exclusive of:  Separately billable procedures and treating other patients   Critical care was necessary to treat or prevent imminent or life-threatening deterioration of the following conditions:  Cardiac failure, circulatory failure and metabolic crisis   Critical care was time spent personally by me on the following activities:  Development of treatment plan with patient or surrogate, discussions with consultants, evaluation of patient's response to treatment, examination of patient,  ordering and review of laboratory studies, ordering and review of radiographic studies, ordering and performing treatments and interventions, pulse oximetry, re-evaluation of patient's condition and review of old charts    MEDICATIONS ORDERED IN ED: Medications  potassium chloride 10 mEq in 100 mL IVPB (10 mEq Intravenous New Bag/Given 11/12/21 1015)  ipratropium (ATROVENT) nebulizer solution 0.5 mg (0.5 mg Inhalation Given 11/12/21 1017)  0.9 %  sodium chloride infusion ( Intravenous New Bag/Given 11/12/21 0933)  hydrALAZINE (  APRESOLINE) injection 5 mg (has no administration in time range)  acetaminophen (TYLENOL) tablet 650 mg (has no administration in time range)  insulin aspart (novoLOG) injection 0-9 Units (has no administration in time range)  insulin aspart (novoLOG) injection 0-5 Units (has no administration in time range)  aspirin EC tablet 81 mg (81 mg Oral Given 11/12/21 1018)  enoxaparin (LOVENOX) injection 40 mg (40 mg Subcutaneous Given 11/12/21 1018)  diphenhydrAMINE (BENADRYL) injection 12.5 mg (12.5 mg Intravenous Given 11/12/21 0938)  amLODipine (NORVASC) tablet 10 mg (has no administration in time range)  multivitamin with minerals tablet 1 tablet (1 tablet Oral Given 11/12/21 1018)  mometasone-formoterol (DULERA) 200-5 MCG/ACT inhaler 2 puff (has no administration in time range)  nirmatrelvir/ritonavir EUA (renal dosing) (PAXLOVID) 2 tablet (has no administration in time range)  methylPREDNISolone sodium succinate (SOLU-MEDROL) 40 mg/mL injection 40 mg (has no administration in time range)  magnesium sulfate IVPB 2 g 50 mL ( Intravenous Restarted 11/12/21 0957)  lactated ringers bolus 500 mL (0 mLs Intravenous Stopped 11/12/21 0932)  potassium chloride SA (KLOR-CON M) CR tablet 40 mEq (40 mEq Oral Given 11/12/21 1017)  sodium chloride 0.9 % bolus 500 mL (500 mLs Intravenous New Bag/Given 11/12/21 1016)     IMPRESSION / MDM / ASSESSMENT AND PLAN / ED COURSE  I reviewed the triage  vital signs and the nursing notes.                               The patient is on the cardiac monitor to evaluate for evidence of arrhythmia and/or significant heart rate changes.   MDM:  81 year old female here with generalized weakness, falls, in the setting of cough.  Primary suspicion is viral URI, COVID, or atypical community-acquired pneumonia causing generalized deconditioning and failure to thrive, now with associated electrolyte abnormalities and weakness.  Chest x-ray reviewed, shows possible bronchitis.  CT head reviewed and shows no traumatic abnormality.  She has a mild leukopenia.  Troponin 21, likely related to demand and EKG is nonischemic.  Lab work shows profound hyponatremia and hypokalemia.  I suspect this is related to her poor p.o. intake, but will send osmolality and further work-up for hyponatremia.  She was given cautious fluid resuscitation.  Potassium and magnesium has been repleted.  Patient will be admitted to medicine service.  She is in agreement with this plan.    MEDICATIONS GIVEN IN ED: Medications  potassium chloride 10 mEq in 100 mL IVPB (10 mEq Intravenous New Bag/Given 11/12/21 1015)  ipratropium (ATROVENT) nebulizer solution 0.5 mg (0.5 mg Inhalation Given 11/12/21 1017)  0.9 %  sodium chloride infusion ( Intravenous New Bag/Given 11/12/21 0933)  hydrALAZINE (APRESOLINE) injection 5 mg (has no administration in time range)  acetaminophen (TYLENOL) tablet 650 mg (has no administration in time range)  insulin aspart (novoLOG) injection 0-9 Units (has no administration in time range)  insulin aspart (novoLOG) injection 0-5 Units (has no administration in time range)  aspirin EC tablet 81 mg (81 mg Oral Given 11/12/21 1018)  enoxaparin (LOVENOX) injection 40 mg (40 mg Subcutaneous Given 11/12/21 1018)  diphenhydrAMINE (BENADRYL) injection 12.5 mg (12.5 mg Intravenous Given 11/12/21 0938)  amLODipine (NORVASC) tablet 10 mg (has no administration in time range)   multivitamin with minerals tablet 1 tablet (1 tablet Oral Given 11/12/21 1018)  mometasone-formoterol (DULERA) 200-5 MCG/ACT inhaler 2 puff (has no administration in time range)  nirmatrelvir/ritonavir EUA (renal dosing) (PAXLOVID) 2 tablet (has  no administration in time range)  methylPREDNISolone sodium succinate (SOLU-MEDROL) 40 mg/mL injection 40 mg (has no administration in time range)  magnesium sulfate IVPB 2 g 50 mL ( Intravenous Restarted 11/12/21 0957)  lactated ringers bolus 500 mL (0 mLs Intravenous Stopped 11/12/21 0932)  potassium chloride SA (KLOR-CON M) CR tablet 40 mEq (40 mEq Oral Given 11/12/21 1017)  sodium chloride 0.9 % bolus 500 mL (500 mLs Intravenous New Bag/Given 11/12/21 1016)     Consults:  Hospitalist for consult and admission   EMR reviewed  Reviewed prior vascular surgery notes for varicose veins and thrombophlebitis, orthopedic visits for osteoarthritis     FINAL CLINICAL IMPRESSION(S) / ED DIAGNOSES   Final diagnoses:  Hypokalemia  Hyponatremia  Bronchitis     Rx / DC Orders   ED Discharge Orders     None        Note:  This document was prepared using Dragon voice recognition software and may include unintentional dictation errors.   Shaune Pollack, MD 11/12/21 1040

## 2021-11-12 NOTE — ED Notes (Signed)
Assumed care for this patient and pyxis was out of potassium. Pharmacy messaged at this time for additional doses.

## 2021-11-12 NOTE — ED Triage Notes (Signed)
Pt to ED via POV with c/o fall. Pt states she is a diabetic and hasn't eaten in 4 days, reports CBG is normal but what "worries [her] is [her] pulse". Pt reports HR 63 PTA, but "it's probably higher now". Pt reports fall just PTA, pt reports fell in the yard. Pt arrives A&O and in NAD upon arrival to triage desk.

## 2021-11-12 NOTE — H&P (Signed)
History and Physical    Kendra Clarke [id number] DOB: March 12, 1941 DOA: 11/12/2021  Referring MD/NP/PA:   PCP: Ranae Plumber, T[place]   Patient coming from:  The patient is coming from home.  At baseline, pt is independent for most of ADL.        Chief Complaint: SOB, fall  HPI: Kendra Clarke is a 81 y.o. female with medical history significant of hypertension, hyperlipidemia, diabetes mellitus, COPD, asthma, varicose vein, large ventral hernia, superficial thrombophlebitis, who presents with cough, shortness of breath, nausea, vomiting, diarrhea, fall.  Patient states that she has been sick for more than 1 week.  She states that she has dry cough, shortness of breath, nausea, vomiting, diarrhea.  She has multiple episodes of nonbilious nonbloody vomiting and multiple watery diarrhea every day.  She states that she has a large ventral hernia, that sometimes causes pain,  no active abdominal pain currently.  No fever or chills.  She has poor appetite and decreased oral intake.  She has generalized weakness, lightheadedness.  Denies symptoms of UTI.  Patient states that she fell this morning due to weakness, but no significant injury.  No headache or neck pain.  No loss of consciousness.  Data Reviewed and ED Course: pt was found to have positive COVID PCR, WBC 3.9, troponin level 21, lipase 43, negative urinalysis, sodium 121, potassium 2.4, magnesium 1.2, phosphorus 2.2, temperature 99.5, blood pressure 145/57, heart rate 70s, RR 24, oxygen saturation 96% on room air.  Chest x-ray showed bronchitis change.  CT of head is negative for acute intracranial abnormalities.  Patient is admitted to telemetry bed as inpatient   EKG: I have personally reviewed.  Sinus rhythm, QTc 506, right axis deviation.   Review of Systems:   General: no fevers, chills, no body weight gain, has poor appetite, has fatigue HEENT: no blurry vision, hearing changes or sore throat Respiratory: has dyspnea, coughing,  wheezing CV: no chest pain, no palpitations GI: has nausea, vomiting, abdominal pain, diarrhea, no constipation. Has a large ventral hernia GU: no dysuria, burning on urination, increased urinary frequency, hematuria  Ext: no leg edema Neuro: no unilateral weakness, numbness, or tingling, no vision change or hearing loss Skin: no rash, no skin tear. MSK: No muscle spasm, no deformity, no limitation of range of movement in spin Heme: No easy bruising.  Travel history: No recent long distant travel.   Allergy:  Allergies  Allergen Reactions   Albuterol Anaphylaxis    Other reaction(s): Other (See Comments) Patient states it gives her an asthma attack   Codeine Anxiety   Sulfa Antibiotics Rash    Past Medical History:  Diagnosis Date   Arthritis    Asthma    COPD (chronic obstructive pulmonary disease) ([place])    Diabetes mellitus without complication ([place])    Hypertension    Liver disease    Umbilical hernia     Past Surgical History:  Procedure Laterality Date   ABDOMINAL HYSTERECTOMY     BLADDER SURGERY     CHOLECYSTECTOMY     nose surgery      Social History:  reports that she has never smoked. She has never used smokeless tobacco. She reports that she does not drink alcohol and does not use drugs.  Family History:  Family History  Problem Relation Age of Onset   Multiple sclerosis Mother    Hypertension Father    COPD Father    Varicose Veins Maternal Aunt    Stroke [employer]  Prior to Admission medications   Medication Sig Start Date End Date Taking? Authorizing Provider  amLODipine (NORVASC) 10 MG tablet Take 10 mg by mouth daily. 09/24/17   [provider]  amoxicillin-clavulanate (AUGMENTIN) 875-125 MG tablet Take 1 tablet by mouth every 12 (twelve) hours. Patient not taking: No sig reported 12/03/17   Salary, Holly Bodily D, MD  ATROVENT HFA 17 MCG/ACT inhaler Inhale 2 puffs into the lungs every 4 (four) hours as needed for wheezing. 12/03/17   Salary,  Avel Peace, MD  Dulaglutide (TRULICITY) [id number] [phone] [facility]  12/16/18   [provider]  erythromycin ophthalmic ointment Place into the right eye every 6 (six) hours. Patient not taking: No sig reported 12/03/17   Salary, Holly Bodily D, MD  glipiZIDE (GLUCOTROL XL) 10 MG 24 hr tablet Take 10 mg by mouth daily. Patient not taking: No sig reported 09/24/17   [provider]  hydrochlorothiazide (MICROZIDE) 12.5 MG capsule 1 by mouth daily for high blood pressure 09/29/20   [provider]  Ipratropium-Albuterol (COMBIVENT RESPIMAT) 20-100 MCG/ACT AERS respimat Inhale 1 puff into the lungs every 6 (six) hours. Patient not taking: No sig reported 12/03/17   Salary, Holly Bodily D, MD  lactobacillus (FLORANEX/LACTINEX) PACK Take 1 packet (1 g total) by mouth 3 (three) times daily with meals. Patient not taking: No sig reported 12/03/17   Salary, Holly Bodily D, MD  lisinopril (ZESTRIL) 20 MG tablet Take by mouth. 09/03/18   [provider]  loperamide (IMODIUM) 2 MG capsule Take 1 capsule (2 mg total) by mouth as needed for diarrhea or loose stools. Patient not taking: No sig reported 12/03/17   Salary, Holly Bodily D, MD  metFORMIN (GLUCOPHAGE) 1000 MG tablet Take 1,000 mg by mouth 2 (two) times daily with a meal.    [provider]  Multiple Vitamin (MULTIVITAMIN WITH MINERALS) TABS tablet Take 1 tablet by mouth daily. 12/04/17   Salary, Avel Peace, MD  oseltamivir (TAMIFLU) 30 MG capsule Take 1 capsule (30 mg total) by mouth 2 (two) times daily. Patient not taking: No sig reported 12/03/17   Salary, Holly Bodily D, MD  oxyCODONE-acetaminophen (PERCOCET/ROXICET) 5-325 MG tablet Take 1 tablet by mouth every 4 (four) hours as needed for severe pain. Patient not taking: No sig reported 01/01/21   Paulette Blanch, MD  PATADAY 0.2 % SOLN Apply 1 drop to eye daily. 08/22/21   [provider]  predniSONE (DELTASONE) 50 MG tablet Take 0.5 tablets (25 mg total) by mouth daily with  breakfast. Patient not taking: No sig reported 12/04/17   Salary, Holly Bodily D, MD  protein supplement shake (PREMIER PROTEIN) LIQD Take 325 mLs (11 oz total) by mouth 2 (two) times daily between meals. Patient not taking: No sig reported 12/03/17   Salary, Holly Bodily D, MD  rosuvastatin (CRESTOR) 10 MG tablet Take 10 mg by mouth daily.    [provider]  SYMBICORT 160-4.5 MCG/ACT inhaler INHALE 2 PUFFS TWICE DAILY FOR ASTHMA 12/02/20   [provider]  TRADJENTA 5 MG TABS tablet Take 5 mg by mouth daily. Patient not taking: No sig reported 09/25/17   [provider]    Physical Exam: Vitals:   11/12/21 1130 11/12/21 1450 11/12/21 1452 11/12/21 1500  BP: (!) 145/111 (!) 168/78  (!) 149/70  Pulse: 80 91  77  Resp: 20 (!) 23 14 16   Temp:      TempSrc:      SpO2: 99% 98%  98%  Weight:  Height:       General: Not in acute distress HEENT:       Eyes: PERRL, EOMI, no scleral icterus.       ENT: No discharge from the ears and nose, no pharynx injection, no tonsillar enlargement.        Neck: No JVD, no bruit, no mass felt. Heme: No neck lymph node enlargement. Cardiac: S1/S2, RRR, No murmurs, No gallops or rubs. Respiratory: Has mild wheezing bilaterally, has coarse breathing sound bilaterally GI: Soft, nondistended, nontender, no rebound pain, no organomegaly, BS present.  Has a large ventral hernia. GU: No hematuria Ext: No pitting leg edema bilaterally. 1+DP/PT pulse bilaterally. Musculoskeletal: No joint deformities, No joint redness or warmth, no limitation of ROM in spin. Skin: No rashes.  Neuro: Alert, oriented X3, cranial nerves II-XII grossly intact, moves all extremities normally.  Psych: Patient is not psychotic, no suicidal or hemocidal ideation.  Labs on Admission: I have personally reviewed following labs and imaging studies  CBC: Recent Labs  Lab 11/12/21 0558  WBC 3.9*  HGB 13.4  HCT 38.4  MCV 85.5  PLT [id number]*   Basic Metabolic  Panel: Recent Labs  Lab 11/12/21 0558 11/12/21 0819 11/12/21 0856  NA 121*  --  121*  K 2.4*  --  2.5*  CL 83*  --  80*  CO2 29  --  28  GLUCOSE 159*  --  129*  BUN 10  --  9  CREATININE 0.74  --  0.75  CALCIUM 8.1*  --  8.5*  MG  --  1.2*  --   PHOS  --   --  2.2*   GFR: Estimated Creatinine Clearance: 56.4 mL/min (by C-G formula based on SCr of 0.75 mg/dL). Liver Function Tests: Recent Labs  Lab 11/12/21 0558  AST 66*  ALT 34  ALKPHOS 81  BILITOT 0.8  PROT 6.3*  ALBUMIN 3.0*   Recent Labs  Lab 11/12/21 0558  LIPASE 43   No results for input(s): AMMONIA in the last 168 hours. Coagulation Profile: No results for input(s): INR, PROTIME in the last 168 hours. Cardiac Enzymes: No results for input(s): CKTOTAL, CKMB, CKMBINDEX, TROPONINI in the last 168 hours. BNP (last 3 results) No results for input(s): PROBNP in the last 8760 hours. HbA1C: No results for input(s): HGBA1C in the last 72 hours. CBG: Recent Labs  Lab 11/12/21 0606 11/12/21 1215  GLUCAP 155* 174*   Lipid Profile: No results for input(s): CHOL, HDL, LDLCALC, TRIG, CHOLHDL, LDLDIRECT in the last 72 hours. Thyroid Function Tests: No results for input(s): TSH, T4TOTAL, FREET4, T3FREE, THYROIDAB in the last 72 hours. Anemia Panel: No results for input(s): VITAMINB12, FOLATE, FERRITIN, TIBC, IRON, RETICCTPCT in the last 72 hours. Urine analysis:    Component Value Date/Time   COLORURINE YELLOW (A) 11/12/2021 0819   APPEARANCEUR CLEAR (A) 11/12/2021 0819   LABSPEC 1.014 11/12/2021 0819   PHURINE 6.0 11/12/2021 0819   GLUCOSEU NEGATIVE 11/12/2021 0819   HGBUR NEGATIVE 11/12/2021 0819   BILIRUBINUR NEGATIVE 11/12/2021 0819   KETONESUR NEGATIVE 11/12/2021 0819   PROTEINUR NEGATIVE 11/12/2021 0819   NITRITE NEGATIVE 11/12/2021 0819   LEUKOCYTESUR TRACE (A) 11/12/2021 0819   Sepsis Labs: @LABRCNTIP (procalcitonin:4,lacticidven:4) ) Recent Results (from the past 240 hour(s))  Resp Panel by  RT-PCR (Flu A&B, Covid) Nasopharyngeal Swab     Status: Abnormal   Collection Time: 11/12/21  8:19 AM   Specimen: Nasopharyngeal Swab; Nasopharyngeal(NP) swabs in vial transport medium  Result Value Ref Range  Status   SARS Coronavirus 2 by RT PCR POSITIVE (A) NEGATIVE Final    Comment: (NOTE) SARS-CoV-2 target nucleic acids are DETECTED.  The SARS-CoV-2 RNA is generally detectable in upper respiratory specimens during the acute phase of infection. Positive results are indicative of the presence of the identified virus, but do not rule out bacterial infection or co-infection with other pathogens not detected by the test. Clinical correlation with patient history and other diagnostic information is necessary to determine patient infection status. The expected result is Negative.  Fact Sheet for Patients: EntrepreneurPulse.com.au  Fact Sheet for Healthcare Providers: IncredibleEmployment.be  This test is not yet approved or cleared by the [place] FDA and  has been authorized for detection and/or diagnosis of SARS-CoV-2 by FDA under an Emergency Use Authorization (EUA).  This EUA will remain in effect (meaning this test can be used) for the duration of  the COVID-19 declaration under Section 564(b)(1) of the A ct, 21 U.S.C. section 360bbb-3(b)(1), unless the authorization is terminated or revoked sooner.     Influenza A by PCR NEGATIVE NEGATIVE Final   Influenza B by PCR NEGATIVE NEGATIVE Final    Comment: (NOTE) The Xpert Xpress SARS-CoV-2/FLU/RSV plus assay is intended as an aid in the diagnosis of influenza from Nasopharyngeal swab specimens and should not be used as a sole basis for treatment. Nasal washings and aspirates are unacceptable for Xpert Xpress SARS-CoV-2/FLU/RSV testing.  Fact Sheet for Patients: EntrepreneurPulse.com.au  Fact Sheet for Healthcare  Providers: IncredibleEmployment.be  This test is not yet approved or cleared by the [place] FDA and has been authorized for detection and/or diagnosis of SARS-CoV-2 by FDA under an Emergency Use Authorization (EUA). This EUA will remain in effect (meaning this test can be used) for the duration of the COVID-19 declaration under Section 564(b)(1) of the Act, 21 U.S.C. section 360bbb-3(b)(1), unless the authorization is terminated or revoked.  Performed at [facility], [address] [place], [place] [address]      Radiological Exams on Admission: DG Chest 2 View  Result Date: 11/12/2021 CLINICAL DATA:  81 year old female with history of cough. EXAM: CHEST - 2 VIEW COMPARISON:  Chest x-ray 12/01/2017. FINDINGS: Lung volumes are normal. Mild diffuse peribronchial cuffing. No consolidative airspace disease. No pleural effusions. No pneumothorax. No pulmonary nodule or mass noted. Pulmonary vasculature and the cardiomediastinal silhouette are within normal limits. Atherosclerosis in the thoracic aorta. IMPRESSION: 1. Mild diffuse peribronchial cuffing, which could suggest an acute bronchitis. 2. Aortic atherosclerosis. Electronically Signed   By: Vinnie Langton M.D.   On: 11/12/2021 06:37   CT Head Wo Contrast  Result Date: 11/12/2021 CLINICAL DATA:  81 year old female with history of head trauma from a fall. EXAM: CT HEAD WITHOUT CONTRAST TECHNIQUE: Contiguous axial images were obtained from the base of the skull through the vertex without intravenous contrast. RADIATION DOSE REDUCTION: This exam was performed according to the departmental dose-optimization program which includes automated exposure control, adjustment of the mA and/or kV according to patient size and/or use of iterative reconstruction technique. COMPARISON:  No priors FINDINGS: Brain: Patchy areas of decreased attenuation are noted throughout the deep and periventricular white matter of the  cerebral hemispheres bilaterally, compatible with mild chronic microvascular ischemic disease. No evidence of acute infarction, hemorrhage, hydrocephalus, extra-axial collection or mass lesion/mass effect. Vascular: Numerous vascular calcifications are noted throughout the cerebral vasculature. No unexpected hyperdense vessels. Skull: Normal. Negative for fracture or focal lesion. Sinuses/Orbits: Multifocal mucosal thickening is noted in the paranasal sinuses,  most severe in the maxillary sinuses bilaterally (left-greater-than-right). There are some high attenuation inspissated secretions also lying dependently in the left maxillary sinus. Other: None. IMPRESSION: 1. No acute intracranial abnormalities. 2. Mild chronic microvascular ischemic changes in the cerebral white matter, as above. 3. 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 Electronically Signed   By: Vinnie Langton M.D.   On: 11/12/2021 06:25      Assessment/Plan Principal Problem:   Acute respiratory disease due to COVID-19 virus Active Problems:   COPD exacerbation ([place])   Sepsis ([place])   Fall at home, initial encounter   HTN (hypertension)   HLD (hyperlipidemia)   Diabetes mellitus without complication ([facility])   Hyponatremia   Hypokalemia   Disorder of electrolytes   Elevated troponin   Nausea & vomiting   Hypomagnesemia   Acute respiratory disease and sepsis due to COVID-19 virus: Patient does not have oxygen desaturation.  Chest x-ray showed bronchitic change, no obvious infiltration.  Patient has sepsis with WBC 3.9, tachypnea with RR 24 pending lactic acid level  -will admit to tele bed as inpt -Paxlovid -Solumedrol 40 mg bid -Bronchodilators -PRN Mucinex for cough -f/u Blood culture -Gentle IV fluid: 1.5 IVF bolus and then 100 cc/h which is also for hyponatremia  COPD exacerbation ([place]): trigged by covid infection -Bronchodilators and Solu-Medrol as above  Fall at  home, initial encounter: CT head negative. -PT/OT  HTN (hypertension) -IV hydralazine as needed -Amlodipine, decrease the dose from 10 to 5 mg daily -Hold HCTZ and lisinopril due to hyponatremia  HLD (hyperlipidemia) -Hold Crestor due to Paxlovid use  Diabetes mellitus without complication [facility]): Patient is taking Trulicity at home -Sliding scale insulin  Hyponatremia: Sodium 121 -Hold lisinopril and HCTZ -IV fluid: 1.5 IVF bolus and then 100 cc/h -Follow-up BMP every 8 hours -Check urine and plasma Osmo, urine sodium level  Disorder of electrolytes including hypokalemia, hypomagnesemia, hyponatremia -Repleted potassium and magnesium  Elevated troponin: Troponin level 21, no chest pain, likely demand ischemia -Aspirin -Trend troponin -Check A1c, FLP  Nausea & vomiting and diarrhea: Most likely due to COVID infection.  No fever or leukocytosis.  Low suspicions for C. difficile -As needed Imodium      DVT ppx:  SQ Lovenox  Code Status: Full code per pt and her son  Family Communication: Yes, patient's  son    by phone  Disposition Plan:  Anticipate discharge back to previous environment  Consults called:  none  Admission status and Level of care: Telemetry Medical:   as inpt      Severity of Illness:  The appropriate patient status for this patient is INPATIENT. Inpatient status is judged to be reasonable and necessary in order to provide the required intensity of service to ensure the patient's safety. The patient's presenting symptoms, physical exam findings, and initial radiographic and laboratory data in the context of their chronic comorbidities is felt to place them at high risk for further clinical deterioration. Furthermore, it is not anticipated that the patient will be medically stable for discharge from the hospital within 2 midnights of admission.   * I certify that at the point of admission it is my clinical judgment that the patient will require  inpatient hospital care spanning beyond 2 midnights from the point of admission due to high intensity of service, high risk for further deterioration and high frequency of surveillance required.*       Date of Service 11/12/2021  Ivor Costa [facility]   If 7PM-7AM, please contact night-coverage [email or web address] 11/12/2021, 3:25 PM

## 2021-11-13 DIAGNOSIS — D72819 Decreased white blood cell count, unspecified: Secondary | ICD-10-CM

## 2021-11-13 DIAGNOSIS — J441 Chronic obstructive pulmonary disease with (acute) exacerbation: Secondary | ICD-10-CM | POA: Diagnosis not present

## 2021-11-13 DIAGNOSIS — J069 Acute upper respiratory infection, unspecified: Secondary | ICD-10-CM | POA: Diagnosis not present

## 2021-11-13 DIAGNOSIS — U071 COVID-19: Secondary | ICD-10-CM

## 2021-11-13 LAB — MAGNESIUM: Magnesium: 1.8 mg/dL (ref 1.7–2.4)

## 2021-11-13 LAB — BASIC METABOLIC PANEL
Anion gap: 12 (ref 5–15)
Anion gap: 10 (ref 5–15)
BUN: 8 mg/dL (ref 8–23)
BUN: 8 mg/dL (ref 8–23)
CO2: 24 mmol/L (ref 22–32)
CO2: 25 mmol/L (ref 22–32)
Calcium: 8.2 mg/dL — ABNORMAL LOW (ref 8.9–10.3)
Calcium: 8 mg/dL — ABNORMAL LOW (ref 8.9–10.3)
Chloride: 92 mmol/L — ABNORMAL LOW (ref 98–111)
Chloride: 94 mmol/L — ABNORMAL LOW (ref 98–111)
Creatinine, Ser: 0.64 mg/dL (ref 0.44–1.00)
Creatinine, Ser: 0.71 mg/dL (ref 0.44–1.00)
GFR, Estimated: >60 mL/min (ref >60)
GFR, Estimated: >60 mL/min (ref >60)
Glucose, Bld: 141 mg/dL — ABNORMAL HIGH (ref 70–99)
Glucose, Bld: 199 mg/dL — ABNORMAL HIGH (ref 70–99)
Potassium: 3.4 mmol/L — ABNORMAL LOW (ref 3.5–5.1)
Potassium: 3.5 mmol/L (ref 3.5–5.1)
Sodium: 128 mmol/L — ABNORMAL LOW (ref 135–145)
Sodium: 129 mmol/L — ABNORMAL LOW (ref 135–145)

## 2021-11-13 LAB — CBC
HCT: 38.2 % (ref 36.0–46.0)
Hemoglobin: 13.3 g/dL (ref 12.0–15.0)
MCH: 29.8 pg (ref 26.0–34.0)
MCHC: 34.8 g/dL (ref 30.0–36.0)
MCV: 85.7 fL (ref 80.0–100.0)
Platelets: 99 10*3/uL — ABNORMAL LOW (ref 150–400)
RBC: 4.46 MIL/uL (ref 3.87–5.11)
RDW: 12.4 % (ref 11.5–15.5)
WBC: 1.4 10*3/uL — CL (ref 4.0–10.5)
nRBC: 0 % (ref 0.0–0.2)

## 2021-11-13 LAB — LIPID PANEL
Cholesterol: 231 mg/dL — ABNORMAL HIGH (ref 0–200)
HDL: 37 mg/dL — ABNORMAL LOW (ref >40)
LDL Cholesterol: 179 mg/dL — ABNORMAL HIGH (ref 0–99)
Total CHOL/HDL Ratio: 6.2 RATIO
Triglycerides: 73 mg/dL (ref <150)
VLDL: 15 mg/dL (ref 0–40)

## 2021-11-13 LAB — GLUCOSE, CAPILLARY
Glucose-Capillary: 224 mg/dL — ABNORMAL HIGH (ref 70–99)
Glucose-Capillary: 329 mg/dL — ABNORMAL HIGH (ref 70–99)
Glucose-Capillary: 338 mg/dL — ABNORMAL HIGH (ref 70–99)
Glucose-Capillary: 257 mg/dL — ABNORMAL HIGH (ref 70–99)

## 2021-11-13 LAB — C-REACTIVE PROTEIN: CRP: 0.6 mg/dL (ref <1.0)

## 2021-11-13 MED ORDER — POTASSIUM CHLORIDE CRYS ER 20 MEQ PO TBCR
40.0000 meq | EXTENDED_RELEASE_TABLET | Freq: Once | ORAL | Status: AC
  Administered 2021-11-13: 40 meq via ORAL
  Filled 2021-11-13: qty 2

## 2021-11-13 MED ORDER — METHYLPREDNISOLONE SODIUM SUCC 125 MG IJ SOLR
80.0000 mg | Freq: Every day | INTRAMUSCULAR | Status: DC
Start: 2021-11-14 — End: 2021-11-14
  Administered 2021-11-14: 80 mg via INTRAVENOUS
  Filled 2021-11-13: qty 2

## 2021-11-13 MED ORDER — GUAIFENESIN ER 600 MG PO TB12
600.0000 mg | ORAL_TABLET | Freq: Two times a day (BID) | ORAL | Status: DC
  Administered 2021-11-13 – 2021-11-21 (×16): 600 mg via ORAL
  Filled 2021-11-13 (×17): qty 1

## 2021-11-13 MED ORDER — LISINOPRIL 20 MG PO TABS
20.0000 mg | ORAL_TABLET | Freq: Every day | ORAL | Status: DC
  Administered 2021-11-13 – 2021-11-21 (×9): 20 mg via ORAL
  Filled 2021-11-13 (×9): qty 1

## 2021-11-13 NOTE — TOC Progression Note (Signed)
Transition of Care [facility]) - Progression Note    Patient Details  Name: Kendra Clarke MRN: [id number] Date of Birth: Mar 15, 1941  [facility] CM/SW [place], RN Phone Number: 11/13/2021, 3:51 PM  Clinical Narrative:   home health recommended for patient, Georgina Snell from [place] confirms the agency will be able to accommodate patient         Expected Discharge Plan and Services                                                 Social Determinants of Health (SDOH) Interventions    Readmission Risk Interventions No flowsheet data found.

## 2021-11-13 NOTE — Progress Notes (Signed)
PROGRESS NOTE    Kendra Clarke  MEJ:[id number] DOB: 1941/01/13 DOA: 11/12/2021 PCP: Shane Crutch, PA   Assessment & Plan:   Principal Problem:   Acute respiratory disease due to COVID-19 virus Active Problems:   Sepsis (HCC)   HTN (hypertension)   HLD (hyperlipidemia)   COPD exacerbation (HCC)   Diabetes mellitus without complication (HCC)   Fall at home, initial encounter   Hyponatremia   Hypokalemia   Disorder of electrolytes   Elevated troponin   Nausea & vomiting   Hypomagnesemia  Sepsis: met criteria w/ leukopenia, tachypnea, & COVID19 infection. Blood cxs NGTD. Continue on IVFs  Acute respiratory distress: likely secondary to COVID19. Continue w/ supportive care  COVID19 infection: continue on paxlovid, IV steroids, bronchodilators & encourage incentive spirometry. Unvaccinated    COPD exacerbation: trigged by covid infection. Continue IV steroids, bronchodilators and encourage incentive spirometry   Leukopenia: etiology unclear, possibly secondary to COVID19. Will continue to monitor   Thrombocytopenia: etiology unclear, possibly secondary to COVID19. Will continue to monitor   Fall: at home. PT/OT consulted    HTN: continue on amlodipine. Hold HCTZ, lisinopril    HLD: hold statin secondary to paxlovid use    DM2: likely poorly controlled. Continue on SSI w/ accuchecks    Hypokalemia: KCl repleated  Hyponatremia: trending up from day prior. Continue on IVFs. Repeat Na level ordered. Hold lisinopril, HCTZ    Elevated troponin: likely secondary to demand ischemia      DVT prophylaxis: lovenox Code Status: full  Family Communication:  discussed pt's care w/ pt's son, Minerva Areola, and answered his questions  Disposition Plan: depends on PT/OT recs  Level of care: Telemetry Medical  Status is: Inpatient Remains inpatient appropriate because: severity of illness, pt c/o SOB      Consultants:    Procedures:   Antimicrobials:   Subjective: Pt c/o  shortness of breath   Objective: Vitals:   11/12/21 1707 11/12/21 2005 11/13/21 0049 11/13/21 0526  BP: (!) 179/79 (!) 163/74 (!) 187/80 (!) 190/92  Pulse: 82 85 84 94  Resp:  16 16 16   Temp:  98.3 F (36.8 C) 98.3 F (36.8 C) 97.7 F (36.5 C)  TempSrc:  Oral Oral Oral  SpO2:  100% 97% 96%  Weight:      Height:        Intake/Output Summary (Last 24 hours) at 11/13/2021 0751 Last data filed at 11/13/2021 0235 Gross per 24 hour  Intake 2315.64 ml  Output --  Net 2315.64 ml   Filed Weights   11/12/21 0554  Weight: 77.1 kg    Examination:  General exam: Appears calm but uncomfortable  Respiratory system: diminished breath sounds b/l  Cardiovascular system: S1 & S2 +. No rubs, gallops or clicks.  Gastrointestinal system: Abdomen is nondistended, soft and nontender. Normal bowel sounds heard. Central nervous system: Alert and oriented. Moves all extremities  Psychiatry: Judgement and insight appear normal. Flat mood and affect.     Data Reviewed: I have personally reviewed following labs and imaging studies  CBC: Recent Labs  Lab 11/12/21 0558 11/13/21 0433  WBC 3.9* 1.4*  HGB 13.4 13.3  HCT 38.4 38.2  MCV 85.5 85.7  PLT 145* 99*   Basic Metabolic Panel: Recent Labs  Lab 11/12/21 0558 11/12/21 0819 11/12/21 0856 11/12/21 1643 11/13/21 0046 11/13/21 0433  NA 121*  --  121* 122* 128*  --   K 2.4*  --  2.5* 3.5 3.4*  --   CL 83*  --  80* 86* 92*  --   CO2 29  --  $R'28 25 24  'td$ --   GLUCOSE 159*  --  129* 185* 141*  --   BUN 10  --  $R'9 8 8  'Ld$ --   CREATININE 0.74  --  0.75 0.61 0.64  --   CALCIUM 8.1*  --  8.5* 8.3* 8.2*  --   MG  --  1.2*  --   --   --  1.8  PHOS  --   --  2.2*  --   --   --    GFR: Estimated Creatinine Clearance: 56.4 mL/min (by C-G formula based on SCr of 0.64 mg/dL). Liver Function Tests: Recent Labs  Lab 11/12/21 0558  AST 66*  ALT 34  ALKPHOS 81  BILITOT 0.8  PROT 6.3*  ALBUMIN 3.0*   Recent Labs  Lab 11/12/21 0558  LIPASE  43   No results for input(s): AMMONIA in the last 168 hours. Coagulation Profile: No results for input(s): INR, PROTIME in the last 168 hours. Cardiac Enzymes: No results for input(s): CKTOTAL, CKMB, CKMBINDEX, TROPONINI in the last 168 hours. BNP (last 3 results) No results for input(s): PROBNP in the last 8760 hours. HbA1C: Recent Labs    11/12/21 0856  HGBA1C 6.0*   CBG: Recent Labs  Lab 11/12/21 0606 11/12/21 1215 11/12/21 1551 11/12/21 2005  GLUCAP 155* 174* 226* 211*   Lipid Profile: Recent Labs    11/13/21 0433  CHOL 231*  HDL 37*  LDLCALC 179*  TRIG 73  CHOLHDL 6.2   Thyroid Function Tests: No results for input(s): TSH, T4TOTAL, FREET4, T3FREE, THYROIDAB in the last 72 hours. Anemia Panel: No results for input(s): VITAMINB12, FOLATE, FERRITIN, TIBC, IRON, RETICCTPCT in the last 72 hours. Sepsis Labs: Recent Labs  Lab 11/12/21 0819  PROCALCITON 0.40  LATICACIDVEN 1.2    Recent Results (from the past 240 hour(s))  Resp Panel by RT-PCR (Flu A&B, Covid) Nasopharyngeal Swab     Status: Abnormal   Collection Time: 11/12/21  8:19 AM   Specimen: Nasopharyngeal Swab; Nasopharyngeal(NP) swabs in vial transport medium  Result Value Ref Range Status   SARS Coronavirus 2 by RT PCR POSITIVE (A) NEGATIVE Final    Comment: (NOTE) SARS-CoV-2 target nucleic acids are DETECTED.  The SARS-CoV-2 RNA is generally detectable in upper respiratory specimens during the acute phase of infection. Positive results are indicative of the presence of the identified virus, but do not rule out bacterial infection or co-infection with other pathogens not detected by the test. Clinical correlation with patient history and other diagnostic information is necessary to determine patient infection status. The expected result is Negative.  Fact Sheet for Patients: EntrepreneurPulse.com.au  Fact Sheet for Healthcare  Providers: IncredibleEmployment.be  This test is not yet approved or cleared by the [place] FDA and  has been authorized for detection and/or diagnosis of SARS-CoV-2 by FDA under an Emergency Use Authorization (EUA).  This EUA will remain in effect (meaning this test can be used) for the duration of  the COVID-19 declaration under Section 564(b)(1) of the A ct, 21 U.S.C. section 360bbb-3(b)(1), unless the authorization is terminated or revoked sooner.     Influenza A by PCR NEGATIVE NEGATIVE Final   Influenza B by PCR NEGATIVE NEGATIVE Final    Comment: (NOTE) The Xpert Xpress SARS-CoV-2/FLU/RSV plus assay is intended as an aid in the diagnosis of influenza from Nasopharyngeal swab specimens and should not be used as a sole basis for  treatment. Nasal washings and aspirates are unacceptable for Xpert Xpress SARS-CoV-2/FLU/RSV testing.  Fact Sheet for Patients: EntrepreneurPulse.com.au  Fact Sheet for Healthcare Providers: IncredibleEmployment.be  This test is not yet approved or cleared by the [place] FDA and has been authorized for detection and/or diagnosis of SARS-CoV-2 by FDA under an Emergency Use Authorization (EUA). This EUA will remain in effect (meaning this test can be used) for the duration of the COVID-19 declaration under Section 564(b)(1) of the Act, 21 U.S.C. section 360bbb-3(b)(1), unless the authorization is terminated or revoked.  Performed at [facility], [address] [place], [place] [address]   Blood culture (single)     Status: None (Preliminary result)   Collection Time: 11/12/21  8:19 AM   Specimen: BLOOD  Result Value Ref Range Status   Specimen Description BLOOD RIGHT ANTECUBITAL  Final   Special Requests   Final    BOTTLES DRAWN AEROBIC AND ANAEROBIC Blood Culture results may not be optimal due to an excessive volume of blood received in culture bottles   Culture    Final    NO GROWTH < 24 HOURS Performed at [facility], [address], [place], [place] [address]    Report Status PENDING  Incomplete         Radiology Studies: DG Chest 2 View  Result Date: 11/12/2021 CLINICAL DATA:  81 year old female with history of cough. EXAM: CHEST - 2 VIEW COMPARISON:  Chest x-ray 12/01/2017. FINDINGS: Lung volumes are normal. Mild diffuse peribronchial cuffing. No consolidative airspace disease. No pleural effusions. No pneumothorax. No pulmonary nodule or mass noted. Pulmonary vasculature and the cardiomediastinal silhouette are within normal limits. Atherosclerosis in the thoracic aorta. IMPRESSION: 1. Mild diffuse peribronchial cuffing, which could suggest an acute bronchitis. 2. Aortic atherosclerosis. Electronically Signed   By: Vinnie Langton M.D.   On: 11/12/2021 06:37   CT Head Wo Contrast  Result Date: 11/12/2021 CLINICAL DATA:  81 year old female with history of head trauma from a fall. EXAM: CT HEAD WITHOUT CONTRAST TECHNIQUE: Contiguous axial images were obtained from the base of the skull through the vertex without intravenous contrast. RADIATION DOSE REDUCTION: This exam was performed according to the departmental dose-optimization program which includes automated exposure control, adjustment of the mA and/or kV according to patient size and/or use of iterative reconstruction technique. COMPARISON:  No priors FINDINGS: Brain: Patchy areas of decreased attenuation are noted throughout the deep and periventricular white matter of the cerebral hemispheres bilaterally, compatible with mild chronic microvascular ischemic disease. No evidence of acute infarction, hemorrhage, hydrocephalus, extra-axial collection or mass lesion/mass effect. Vascular: Numerous vascular calcifications are noted throughout the cerebral vasculature. No unexpected hyperdense vessels. Skull: Normal. Negative for fracture or focal lesion. Sinuses/Orbits: Multifocal mucosal  thickening is noted in the paranasal sinuses, most severe in the maxillary sinuses bilaterally (left-greater-than-right). There are some high attenuation inspissated secretions also lying dependently in the left maxillary sinus. Other: None. IMPRESSION: 1. No acute intracranial abnormalities. 2. Mild chronic microvascular ischemic changes in the cerebral white matter, as above. 3. 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 Electronically Signed   By: Vinnie Langton M.D.   On: 11/12/2021 06:25        Scheduled Meds:  amLODipine  5 mg Oral Daily   aspirin EC  81 mg Oral Daily   enoxaparin (LOVENOX) injection  40 mg Subcutaneous Q24H   fluticasone  2 puff Inhalation BID   insulin aspart  0-5 Units  Subcutaneous QHS   insulin aspart  0-9 Units Subcutaneous TID WC   methylPREDNISolone (SOLU-MEDROL) injection  40 mg Intravenous Q12H   multivitamin with minerals  1 tablet Oral Daily   nirmatrelvir/ritonavir EUA (renal dosing)  2 tablet Oral BID   Continuous Infusions:  sodium chloride 100 mL/hr at 11/13/21 0235     LOS: 1 day    Time spent: 30 mins    Wyvonnia Dusky, MD [facility] Pager [phone]  If 7PM-7AM, please contact night-coverage 11/13/2021, 7:51 AM

## 2021-11-13 NOTE — Progress Notes (Signed)
Mandy with Lab called and reported WBC 1.4 and Platelets 99. Made MD aware.

## 2021-11-13 NOTE — Progress Notes (Signed)
PHARMACIST - PHYSICIAN COMMUNICATION   CONCERNING: Methylprednisolone IV    Current order: Methylprednisolone IV 40 mg BID   DESCRIPTION: Per [facility] Protocol:   IV methylprednisolone will be converted to either a q12h or q24h frequency with the same total daily dose (TDD).  Ordered Dose: 1 to 125 mg TDD; convert to: TDD q24h.  Ordered Dose: 126 to 250 mg TDD; convert to: TDD div q12h.  Ordered Dose: >250 mg TDD; DAW.  Order has been adjusted to: Methylprednisolone IV 80 mg daily  Benita Gutter 11/13/2021 3:25 PM

## 2021-11-13 NOTE — Progress Notes (Signed)
Inpatient Diabetes Program Recommendations  AACE/ADA: New Consensus Statement on Inpatient Glycemic Control (2015)  Target Ranges:  Prepandial:   less than 140 mg/dL      Peak postprandial:   less than 180 mg/dL (1-2 hours)      Critically ill patients:  140 - 180 mg/dL   Lab Results  Component Value Date   GLUCAP 224 (H) 11/13/2021   HGBA1C 6.0 (H) 11/12/2021    Review of Glycemic Control  Latest Reference Range & Units 12/03/17 07:38 12/03/17 12:11 11/12/21 06:06 11/12/21 12:15 11/12/21 15:51 11/12/21 20:05 11/13/21 08:25  Glucose-Capillary 70 - 99 mg/dL 196 (H) 212 (H) 155 (H) 174 (H) 226 (H) 211 (H) 224 (H)  (H): Data is abnormally high Diabetes history: DM2 Outpatient Diabetes medications: Trulicity [id number] mg q week Current orders for Inpatient glycemic control: Novolog 0-9 units tid + hs 0-5 units   Inpatient Diabetes Program Recommendations:   While on steroids, -Consider increase in Novolog correction to 0-15 units tid  Thank you, Nani Gasser. Caniya Tagle, RN, MSN, CDE  Diabetes Coordinator Inpatient Glycemic Control Team Team Pager [phone] (8am-5pm) 11/13/2021 10:30 AM

## 2021-11-13 NOTE — Evaluation (Signed)
Physical Therapy Evaluation Patient Details Name: Kendra Clarke MRN: [id number] DOB: [birth year] Today's Date: 11/13/2021  History of Present Illness  Pt is an 81 y.o. female presenting to hospital 2/26 with c/o SOB, cough, fatigue, increasing weakness, nausea, difficulty tolerating p.o., diarrhea, and significant lightheadedness with standing causing her to fall several times.  Pt admitted with acute respiratory disease and sepsis d/t COVID-19 virus, COPD exacerbation, leukopenia, and thrombocyptopenia.  PMH includes sepsis, DM, htn, HLD, DM, COPD, large ventral hernia, and superficial thrombophlebitis.  Clinical Impression  Prior to hospital admission, pt was independent with ambulation; lives with her granddaughter (pt reports she can assist pt as needed) and also her nephew (pt reports being her nephew's caregiver).  Currently pt is SBA semi-supine to sitting edge of bed; CGA to min assist with transfers using single hand hold assist; and CGA to min assist to ambulate a few feet BSC to recliner with single hand hold assist (pt attempting to mobilize without use of RW but anticipate pt would benefit from RW use to improve safety, balance, and independence with functional mobility).  Limited activity during session d/t pt fatigue.  Pt would benefit from skilled PT to address noted impairments and functional limitations (see below for any additional details).  Upon hospital discharge, pt would benefit from HHPT and support from family.    Recommendations for follow up therapy are one component of a multi-disciplinary discharge planning process, led by the attending physician.  Recommendations may be updated based on patient status, additional functional criteria and insurance authorization.  Follow Up Recommendations Home health PT    Assistance Recommended at Discharge Intermittent Supervision/Assistance  Patient can return home with the following  A little help with walking and/or  transfers;Assistance with cooking/housework;Assist for transportation;Help with stairs or ramp for entrance    Equipment Recommendations Rolling walker (2 wheels)  Recommendations for Other Services       Functional Status Assessment Patient has had a recent decline in their functional status and demonstrates the ability to make significant improvements in function in a reasonable and predictable amount of time.     Precautions / Restrictions Precautions Precautions: Fall Restrictions Weight Bearing Restrictions: No      Mobility  Bed Mobility Overal bed mobility: Needs Assistance Bed Mobility: Supine to Sit     Supine to sit: Supervision     General bed mobility comments: mild increased effort to perform on own    Transfers Overall transfer level: Needs assistance Equipment used: 1 person hand held assist Transfers: Sit to/from Stand, Bed to chair/wheelchair/BSC Sit to Stand: Min guard, Min assist   Step pivot transfers: Min guard, Min assist (stand step turn bed to [facility])       General transfer comment: assist to steady (pt trying mobility without walker)    Ambulation/Gait Ambulation/Gait assistance: Min guard, Min assist Gait Distance (Feet): 3 Feet (BSC to recliner) Assistive device: 1 person hand held assist   Gait velocity: decreased     General Gait Details: assist to steady (pt trying mobility without walker)  Stairs            Wheelchair Mobility    Modified Rankin (Stroke Patients Only)       Balance Overall balance assessment: Needs assistance Sitting-balance support: No upper extremity supported, Feet supported Sitting balance-Leahy Scale: Good Sitting balance - Comments: steady sitting reaching within BOS   Standing balance support: Single extremity supported Standing balance-Leahy Scale: Fair Standing balance comment: steady static standing with at least  single UE support                             Pertinent  Vitals/Pain Pain Assessment Pain Assessment: No/denies pain Vitals (HR and O2 on room air) stable and WFL throughout treatment session.    Home Living Family/patient expects to be discharged to:: Private residence Living Arrangements: Other relatives (granddaughter and nephew) Available Help at Discharge: Family;Available 24 hours/day (50 y.o. 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.); very active     Hand Dominance   Dominant Hand: Right    Extremity/Trunk Assessment   Upper Extremity Assessment Upper Extremity Assessment: Generalized weakness    Lower Extremity Assessment Lower Extremity Assessment: Generalized weakness    Cervical / Trunk Assessment Cervical / Trunk Assessment: Normal  Communication   Communication: No difficulties  Cognition Arousal/Alertness: Awake/alert Behavior During Therapy: WFL for tasks assessed/performed Overall Cognitive Status: Within Functional Limits for tasks assessed                                          General Comments  Nursing cleared pt for participation in physical therapy.  Pt agreeable to PT session.     Exercises  Mobility training   Assessment/Plan    PT Assessment Patient needs continued PT services  PT Problem List Decreased strength;Decreased activity tolerance;Decreased balance;Decreased mobility;Decreased knowledge of use of DME;Cardiopulmonary status limiting activity       PT Treatment Interventions DME  instruction;Gait training;Stair training;Functional mobility training;Therapeutic activities;Therapeutic exercise;Balance training;Patient/family education    PT Goals (Current goals can be found in the Care Plan section)  Acute Rehab PT Goals Patient Stated Goal: to improve breathing and go home PT Goal Formulation: With patient Time For Goal Achievement: 11/27/21 Potential to Achieve Goals: Good    Frequency Min 2X/week     Co-evaluation               AM-PAC PT "6 Clicks" Mobility  Outcome Measure Help needed turning from your back to your side while in a flat bed without using bedrails?: None Help needed moving from lying on your back to sitting on the side of a flat bed without using bedrails?: A Little Help needed moving to and from a bed to a chair (including a wheelchair)?: A Little Help needed standing up from a chair using your arms (e.g., wheelchair or bedside chair)?: A Little Help needed to walk in hospital room?: A Little Help needed climbing 3-5 steps with a railing? : A Lot 6 Click Score: 18    End of Session Equipment Utilized During Treatment: Gait belt Activity Tolerance: Patient limited by fatigue Patient left: in chair;with call bell/phone within reach;with chair alarm set Nurse Communication: Mobility status;Precautions PT Visit Diagnosis: Unsteadiness on feet (R26.81);Muscle weakness (generalized) (M62.81);History of falling (Z91.81)    Time: 9163-8466 PT Time Calculation (min) (ACUTE ONLY): 37 min  Charges:   PT Evaluation $PT Eval Low Complexity: 1 Low PT Treatments $Therapeutic Activity: 23-37 mins       Hendricks Limes, PT 11/13/21, 1:52 PM

## 2021-11-13 NOTE — Evaluation (Signed)
Occupational Therapy Evaluation Patient Details Name: Kendra Clarke MRN: [id number] DOB: 07-Mar-1941 Today's Date: 11/13/2021   History of Present Illness 81 y.o. female with medical history significant of hypertension, hyperlipidemia, diabetes mellitus, COPD, asthma, varicose vein, large ventral hernia, superficial thrombophlebitis, who presents with cough, shortness of breath, nausea, vomiting, diarrhea, fall. Pt positive for covid on admission.   Clinical Impression   Patient presenting with decreased independence in self care, balance, functional mobility/transfers, endurance, and safety awareness. 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 Pt currently has her granddaughter home to assist her as needed at discharge. Pt was supervision level for self care and functional mobility. Pt demonstrates ability to manage body suit compression garment during toileting task without assist. Patient will benefit from acute OT to increase overall independence in the areas of ADLs, functional mobility, and safety awareness in order to safely discharge home with family.     Recommendations for follow up therapy are one component of a multi-disciplinary discharge planning process, led by the attending physician.  Recommendations may be updated based on patient status, additional functional criteria and insurance authorization.   Follow Up Recommendations  Home health OT    Assistance Recommended at Discharge Intermittent Supervision/Assistance  Patient can return home with the following A little help with walking and/or transfers;A little help with bathing/dressing/bathroom;Help with stairs or ramp for entrance    Functional Status Assessment  Patient has had a recent decline in their functional status and demonstrates the ability to make significant improvements in function in a reasonable and predictable amount of time.  Equipment Recommendations   BSC/3in1       Precautions / Restrictions Precautions Precautions: Fall      Mobility Bed Mobility Overal bed mobility: Needs Assistance Bed Mobility: Supine to Sit, Sit to Supine     Supine to sit: Supervision Sit to supine: Supervision   General bed mobility comments: no physical assistance    Transfers Overall transfer level: Needs assistance Equipment used: Rolling walker (2 wheels) Transfers: Sit to/from Stand, Bed to chair/wheelchair/BSC Sit to Stand: Supervision     Step pivot transfers: Supervision            Balance Overall balance assessment: Needs assistance Sitting-balance support: Feet supported Sitting balance-Leahy Scale: Good     Standing balance support: Reliant on assistive device for balance, During functional activity Standing balance-Leahy Scale: Good                             ADL either performed or assessed with clinical judgement   ADL Overall ADL's : Needs assistance/impaired                                       General ADL Comments: supervision overall for functional mobility within the room. Pt manages compression body suit for toileting needs and performs toilet transfer and hygiene with supervision. No physical assistance     Vision Patient Visual Report: No change from baseline              Pertinent Vitals/Pain Pain Assessment Pain Assessment: No/denies pain     Hand Dominance Right   Extremity/Trunk Assessment Upper Extremity Assessment Upper Extremity Assessment: Overall WFL for tasks assessed;Generalized weakness   Lower Extremity Assessment Lower Extremity Assessment: Overall WFL for tasks assessed;Generalized weakness  Communication Communication Communication: No difficulties   Cognition Arousal/Alertness: Awake/alert Behavior During Therapy: WFL for tasks assessed/performed Overall Cognitive Status: Within Functional Limits for tasks assessed                                                   Home Living Family/patient expects to be discharged to:: Private residence Living Arrangements: Other relatives Available Help at Discharge: Family;Available 24 hours/day (granddaugher will be staying with her and assisting as needed) Type of Home: Mobile home Home Access: Stairs to enter Entrance Stairs-Number of Steps: 4 Entrance Stairs-Rails: Right;Left;Can reach both Home Layout: One level     Bathroom Shower/Tub: Sponge bathes at baseline         Home Equipment: Conservation officer, nature (2 wheels)          Prior Functioning/Environment Prior Level of Function : Independent/Modified Independent               ADLs Comments: Pt is very active drives, yard work, Social research officer, government. at independent level. 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        OT Problem List: Decreased strength;Decreased activity tolerance;Impaired balance (sitting and/or standing);Decreased safety awareness      OT Treatment/Interventions: Self-care/ADL training;Therapeutic exercise;Therapeutic activities;Energy conservation;DME and/or AE instruction;Manual therapy;Balance training;Patient/family education    OT Goals(Current goals can be found in the care plan section) Acute Rehab OT Goals Patient Stated Goal: to go home with family OT Goal Formulation: With patient Time For Goal Achievement: 11/27/21 Potential to Achieve Goals: Fair ADL Goals Pt Will Perform Grooming: Independently;standing Pt Will Perform Lower Body Dressing: with modified independence;sit to/from stand Pt Will Transfer to Toilet: with modified independence;ambulating Pt Will Perform Toileting - Clothing Manipulation and hygiene: with modified independence;sit to/from stand  OT Frequency: Min 2X/week       AM-PAC OT "6 Clicks" Daily Activity     Outcome Measure Help from another person eating meals?: None Help from another person taking care of personal grooming?: None Help  from another person toileting, which includes using toliet, bedpan, or urinal?: None Help from another person bathing (including washing, rinsing, drying)?: A Little Help from another person to put on and taking off regular upper body clothing?: None Help from another person to put on and taking off regular lower body clothing?: A Little 6 Click Score: 22   End of Session Nurse Communication: Mobility status  Activity Tolerance: Patient tolerated treatment well Patient left: in bed;with call bell/phone within reach;with bed alarm set  OT Visit Diagnosis: Unsteadiness on feet (R26.81);Muscle weakness (generalized) (M62.81)                Time: [id number] OT Time Calculation (min): 24 min Charges:  OT General Charges $OT Visit: 1 Visit OT Evaluation $OT Eval Moderate Complexity: 1 Mod OT Treatments $Self Care/Home Management : 8-22 mins  Darleen Crocker, MS, OTR/L , CBIS ascom [phone]  11/13/21, 12:50 PM

## 2021-11-14 DIAGNOSIS — J441 Chronic obstructive pulmonary disease with (acute) exacerbation: Secondary | ICD-10-CM | POA: Diagnosis not present

## 2021-11-14 DIAGNOSIS — E871 Hypo-osmolality and hyponatremia: Secondary | ICD-10-CM | POA: Diagnosis not present

## 2021-11-14 DIAGNOSIS — U071 COVID-19: Secondary | ICD-10-CM | POA: Diagnosis not present

## 2021-11-14 DIAGNOSIS — J069 Acute upper respiratory infection, unspecified: Secondary | ICD-10-CM | POA: Diagnosis not present

## 2021-11-14 LAB — BASIC METABOLIC PANEL
Anion gap: 8 (ref 5–15)
BUN: 11 mg/dL (ref 8–23)
CO2: 25 mmol/L (ref 22–32)
Calcium: 8.1 mg/dL — ABNORMAL LOW (ref 8.9–10.3)
Chloride: 93 mmol/L — ABNORMAL LOW (ref 98–111)
Creatinine, Ser: 0.73 mg/dL (ref 0.44–1.00)
GFR, Estimated: >60 mL/min (ref >60)
Glucose, Bld: 228 mg/dL — ABNORMAL HIGH (ref 70–99)
Potassium: 4.3 mmol/L (ref 3.5–5.1)
Sodium: 126 mmol/L — ABNORMAL LOW (ref 135–145)

## 2021-11-14 LAB — CBC WITH DIFFERENTIAL/PLATELET
Abs Immature Granulocytes: 0.03 10*3/uL (ref 0.00–0.07)
Basophils Absolute: 0 10*3/uL (ref 0.0–0.1)
Basophils Relative: 0 %
Eosinophils Absolute: 0 10*3/uL (ref 0.0–0.5)
Eosinophils Relative: 0 %
HCT: 38.2 % (ref 36.0–46.0)
Hemoglobin: 13 g/dL (ref 12.0–15.0)
Immature Granulocytes: 0 %
Lymphocytes Relative: 6 %
Lymphs Abs: 0.4 10*3/uL — ABNORMAL LOW (ref 0.7–4.0)
MCH: 29.7 pg (ref 26.0–34.0)
MCHC: 34 g/dL (ref 30.0–36.0)
MCV: 87.4 fL (ref 80.0–100.0)
Monocytes Absolute: 0.3 10*3/uL (ref 0.1–1.0)
Monocytes Relative: 4 %
Neutro Abs: 6.3 10*3/uL (ref 1.7–7.7)
Neutrophils Relative %: 90 %
Platelets: 141 10*3/uL — ABNORMAL LOW (ref 150–400)
RBC: 4.37 MIL/uL (ref 3.87–5.11)
RDW: 12.8 % (ref 11.5–15.5)
WBC: 7.1 10*3/uL (ref 4.0–10.5)
nRBC: 0 % (ref 0.0–0.2)

## 2021-11-14 LAB — SODIUM: Sodium: 127 mmol/L — ABNORMAL LOW (ref 135–145)

## 2021-11-14 LAB — GLUCOSE, CAPILLARY
Glucose-Capillary: 210 mg/dL — ABNORMAL HIGH (ref 70–99)
Glucose-Capillary: 223 mg/dL — ABNORMAL HIGH (ref 70–99)
Glucose-Capillary: 382 mg/dL — ABNORMAL HIGH (ref 70–99)
Glucose-Capillary: 279 mg/dL — ABNORMAL HIGH (ref 70–99)

## 2021-11-14 LAB — LEGIONELLA PNEUMOPHILA SEROGP 1 UR AG: L. pneumophila Serogp 1 Ur Ag: NEGATIVE

## 2021-11-14 MED ORDER — INSULIN GLARGINE-YFGN 100 UNIT/ML ~~LOC~~ SOLN
5.0000 [IU] | Freq: Every day | SUBCUTANEOUS | Status: DC
  Administered 2021-11-14 – 2021-11-15 (×2): 5 [IU] via SUBCUTANEOUS
  Filled 2021-11-14 (×3): qty 0.05

## 2021-11-14 MED ORDER — PREDNISONE 20 MG PO TABS
40.0000 mg | ORAL_TABLET | Freq: Every day | ORAL | Status: DC
  Administered 2021-11-15 – 2021-11-21 (×7): 40 mg via ORAL
  Filled 2021-11-14 (×7): qty 2

## 2021-11-14 MED ORDER — SODIUM CHLORIDE 0.9 % IV SOLN
INTRAVENOUS | Status: DC
Start: 2021-11-14 — End: 2021-11-15

## 2021-11-14 MED ORDER — INSULIN ASPART 100 UNIT/ML IJ SOLN
3.0000 [IU] | Freq: Three times a day (TID) | INTRAMUSCULAR | Status: DC
Start: 2021-11-14 — End: 2021-11-21
  Administered 2021-11-14 – 2021-11-21 (×11): 3 [IU] via SUBCUTANEOUS
  Filled 2021-11-14 (×17): qty 1

## 2021-11-14 NOTE — Progress Notes (Signed)
Inpatient Diabetes Program Recommendations  AACE/ADA: New Consensus Statement on Inpatient Glycemic Control  Target Ranges:  Prepandial:   less than 140 mg/dL      Peak postprandial:   less than 180 mg/dL (1-2 hours)      Critically ill patients:  140 - 180 mg/dL    Latest Reference Range & Units 11/13/21 08:25 11/13/21 12:04 11/13/21 16:17 11/13/21 22:06 11/14/21 07:56  Glucose-Capillary 70 - 99 mg/dL 224 (H) 329 (H) 338 (H) 257 (H) 223 (H)    Latest Reference Range & Units 11/12/21 08:56  Hemoglobin A1C 4.8 - 5.6 % 6.0 (H)   Review of Glycemic Control  Diabetes history: DM2 Outpatient Diabetes medications: Trulicity [id number] mg Qweek Current orders for Inpatient glycemic control: Novolog 0-9 units TID with meals, Novolog 0-5 units QHS; Solumedrol 80 mg daily  Inpatient Diabetes Program Recommendations:    Insulin: If steroids are continued, please consider ordering Semglee 5 units Q24H and Novolog 3 units TID with meals for meal coverage if patient eats at least 50% of meals.  Thanks, Barnie Alderman, RN, MSN, CDE Diabetes Coordinator Inpatient Diabetes Program [phone] (Team Pager from 8am to 5pm)

## 2021-11-14 NOTE — Plan of Care (Signed)
°  Problem: Clinical Measurements: Goal: Ability to maintain a body temperature in the normal range will improve Outcome: Progressing   Problem: Education: Goal: Knowledge of risk factors and measures for prevention of condition will improve Outcome: Progressing   Problem: Coping: Goal: Psychosocial and spiritual needs will be supported Outcome: Progressing   Problem: Respiratory: Goal: Will maintain a patent airway Outcome: Progressing

## 2021-11-14 NOTE — Progress Notes (Signed)
PROGRESS NOTE   HPI was taken from Dr. Clyde Lundborg: Kendra Clarke is a 81 y.o. female with medical history significant of hypertension, hyperlipidemia, diabetes mellitus, COPD, asthma, varicose vein, large ventral hernia, superficial thrombophlebitis, who presents with cough, shortness of breath, nausea, vomiting, diarrhea, fall.   Patient states that she has been sick for more than 1 week.  She states that she has dry cough, shortness of breath, nausea, vomiting, diarrhea.  She has multiple episodes of nonbilious nonbloody vomiting and multiple watery diarrhea every day.  She states that she has a large ventral hernia, that sometimes causes pain,  no active abdominal pain currently.  No fever or chills.  She has poor appetite and decreased oral intake.  She has generalized weakness, lightheadedness.  Denies symptoms of UTI.  Patient states that she fell this morning due to weakness, but no significant injury.  No headache or neck pain.  No loss of consciousness.   Data Reviewed and ED Course: pt was found to have positive COVID PCR, WBC 3.9, troponin level 21, lipase 43, negative urinalysis, sodium 121, potassium 2.4, magnesium 1.2, phosphorus 2.2, temperature 99.5, blood pressure 145/57, heart rate 70s, RR 24, oxygen saturation 96% on room air.  Chest x-ray showed bronchitis change.  CT of head is negative for acute intracranial abnormalities.  Patient is admitted to telemetry bed as inpatient   Kendra Clarke  [id number] DOB: 1940-10-12 DOA: 11/12/2021 PCP: Shane Crutch, PA   Assessment & Plan:   Principal Problem:   Acute respiratory disease due to COVID-19 virus Active Problems:   Sepsis (HCC)   HTN (hypertension)   HLD (hyperlipidemia)   COPD exacerbation (HCC)   Diabetes mellitus without complication (HCC)   Fall at home, initial encounter   Hyponatremia   Hypokalemia   Disorder of electrolytes   Elevated troponin   Nausea & vomiting   Hypomagnesemia  Sepsis: met criteria w/  leukopenia, tachypnea, & COVID19 infection. Blood cxs NGTD. Continue on IVFs  Acute respiratory distress: likely secondary to COVID19. Continue w/ supportive care  COVID19 infection: continue on paxlovid, steroid taper, bronchodilators & encourage incentive spirometry. Unvaccinated    COPD exacerbation: trigged by covid infection. Continue on steroid taper, bronchodilators & encourage incentive spirometry   Leukopenia: resolved   Thrombocytopenia: etiology unclear, possibly secondary to COVID19. Trending up today   Fall: at home. PT/OT recs HH. HH set up as per CM    HTN: continue on amlodipine, lisinopril. Continue to HCTZ    HLD: continue to hold statin secondary to paxlovid use    DM2: well controlled, HbA1c 6.0. Continue on SSI w/ accuchecks     Hypokalemia: WNL today   Hyponatremia: trending back down today. Restarted IVFs. Repeat Na level ordered. Hold HCTZ    Elevated troponin: likely secondary to demand ischemia       DVT prophylaxis: lovenox Code Status: full  Family Communication:  discussed pt's care w/ pt's son, Minerva Areola, and answered his questions  Disposition Plan: likely d/c home tomorrow w/ HH if Na level is trending up, improved SOB  Level of care: Telemetry Medical  Status is: Inpatient Remains inpatient appropriate because: likely d/c home tomorrow w/ HH if Na level is trending up, improved SOB. Na dropped to 126 today      Consultants:    Procedures:   Antimicrobials:   Subjective: Pt c/o shortness of breath, slightly improved from day prior   Objective: Vitals:   11/13/21 0227 11/13/21 1616 11/13/21 2046 11/14/21 0447  BP: (!) 179/85 (!) 157/69 (!) 157/74 (!) 161/71  Pulse: (!) 107 98 96 90  Resp: $Remo'18 16 16 18  'PvZBs$ Temp: 98 F (36.7 C) 98.3 F (36.8 C) 98.1 F (36.7 C) 97.6 F (36.4 C)  TempSrc: Oral Oral    SpO2: 99% 99% 99% 99%  Weight:      Height:       No intake or output data in the 24 hours ending 11/14/21 0745  Filed Weights    11/12/21 0554  Weight: 77.1 kg    Examination:  General exam: Appears calm & comfortable  Respiratory system: decreased breath sounds b/l  Cardiovascular system: S1/S2+. No rubs or clicks  Gastrointestinal system: Abd is soft, NT, ND & hypoactive bowel sounds  Central nervous system: Alert and oriented. Moves all extremities  Psychiatry: Judgement and insight appears normal. Appropriate mood and affect     Data Reviewed: I have personally reviewed following labs and imaging studies  CBC: Recent Labs  Lab 11/12/21 0558 11/13/21 0433 11/14/21 0530  WBC 3.9* 1.4* 7.1  NEUTROABS  --   --  6.3  HGB 13.4 13.3 13.0  HCT 38.4 38.2 38.2  MCV 85.5 85.7 87.4  PLT 145* 99* 408*   Basic Metabolic Panel: Recent Labs  Lab 11/12/21 0819 11/12/21 0856 11/12/21 1643 11/13/21 0046 11/13/21 0433 11/14/21 0530  NA  --  121* 122* 128* 129* 126*  K  --  2.5* 3.5 3.4* 3.5 4.3  CL  --  80* 86* 92* 94* 93*  CO2  --  $R'28 25 24 25 25  'ji$ GLUCOSE  --  129* 185* 141* 199* 228*  BUN  --  $R'9 8 8 8 11  'Ht$ CREATININE  --  0.75 0.61 0.64 0.71 0.73  CALCIUM  --  8.5* 8.3* 8.2* 8.0* 8.1*  MG 1.2*  --   --   --  1.8  --   PHOS  --  2.2*  --   --   --   --    GFR: Estimated Creatinine Clearance: 56.4 mL/min (by C-G formula based on SCr of 0.73 mg/dL). Liver Function Tests: Recent Labs  Lab 11/12/21 0558  AST 66*  ALT 34  ALKPHOS 81  BILITOT 0.8  PROT 6.3*  ALBUMIN 3.0*   Recent Labs  Lab 11/12/21 0558  LIPASE 43   No results for input(s): AMMONIA in the last 168 hours. Coagulation Profile: No results for input(s): INR, PROTIME in the last 168 hours. Cardiac Enzymes: No results for input(s): CKTOTAL, CKMB, CKMBINDEX, TROPONINI in the last 168 hours. BNP (last 3 results) No results for input(s): PROBNP in the last 8760 hours. HbA1C: Recent Labs    11/12/21 0856  HGBA1C 6.0*   CBG: Recent Labs  Lab 11/12/21 2005 11/13/21 0825 11/13/21 1204 11/13/21 1617 11/13/21 2206  GLUCAP  211* 224* 329* 338* 257*   Lipid Profile: Recent Labs    11/13/21 0433  CHOL 231*  HDL 37*  LDLCALC 179*  TRIG 73  CHOLHDL 6.2   Thyroid Function Tests: No results for input(s): TSH, T4TOTAL, FREET4, T3FREE, THYROIDAB in the last 72 hours. Anemia Panel: No results for input(s): VITAMINB12, FOLATE, FERRITIN, TIBC, IRON, RETICCTPCT in the last 72 hours. Sepsis Labs: Recent Labs  Lab 11/12/21 0819  PROCALCITON 0.40  LATICACIDVEN 1.2    Recent Results (from the past 240 hour(s))  Resp Panel by RT-PCR (Flu A&B, Covid) Nasopharyngeal Swab     Status: Abnormal   Collection Time: 11/12/21  8:19 AM  Specimen: Nasopharyngeal Swab; Nasopharyngeal(NP) swabs in vial transport medium  Result Value Ref Range Status   SARS Coronavirus 2 by RT PCR POSITIVE (A) NEGATIVE Final    Comment: (NOTE) SARS-CoV-2 target nucleic acids are DETECTED.  The SARS-CoV-2 RNA is generally detectable in upper respiratory specimens during the acute phase of infection. Positive results are indicative of the presence of the identified virus, but do not rule out bacterial infection or co-infection with other pathogens not detected by the test. Clinical correlation with patient history and other diagnostic information is necessary to determine patient infection status. The expected result is Negative.  Fact Sheet for Patients: EntrepreneurPulse.com.au  Fact Sheet for Healthcare Providers: IncredibleEmployment.be  This test is not yet approved or cleared by the [place] FDA and  has been authorized for detection and/or diagnosis of SARS-CoV-2 by FDA under an Emergency Use Authorization (EUA).  This EUA will remain in effect (meaning this test can be used) for the duration of  the COVID-19 declaration under Section 564(b)(1) of the A ct, 21 U.S.C. section 360bbb-3(b)(1), unless the authorization is terminated or revoked sooner.     Influenza A by PCR NEGATIVE  NEGATIVE Final   Influenza B by PCR NEGATIVE NEGATIVE Final    Comment: (NOTE) The Xpert Xpress SARS-CoV-2/FLU/RSV plus assay is intended as an aid in the diagnosis of influenza from Nasopharyngeal swab specimens and should not be used as a sole basis for treatment. Nasal washings and aspirates are unacceptable for Xpert Xpress SARS-CoV-2/FLU/RSV testing.  Fact Sheet for Patients: EntrepreneurPulse.com.au  Fact Sheet for Healthcare Providers: IncredibleEmployment.be  This test is not yet approved or cleared by the [place] FDA and has been authorized for detection and/or diagnosis of SARS-CoV-2 by FDA under an Emergency Use Authorization (EUA). This EUA will remain in effect (meaning this test can be used) for the duration of the COVID-19 declaration under Section 564(b)(1) of the Act, 21 U.S.C. section 360bbb-3(b)(1), unless the authorization is terminated or revoked.  Performed at [facility], [address] [place], [place] [address]   Blood culture (single)     Status: None (Preliminary result)   Collection Time: 11/12/21  8:19 AM   Specimen: BLOOD  Result Value Ref Range Status   Specimen Description BLOOD RIGHT ANTECUBITAL  Final   Special Requests   Final    BOTTLES DRAWN AEROBIC AND ANAEROBIC Blood Culture results may not be optimal due to an excessive volume of blood received in culture bottles   Culture   Final    NO GROWTH < 24 HOURS Performed at [facility], [address] [place], [place] [address]    Report Status PENDING  Incomplete         Radiology Studies: No results found.      Scheduled Meds:  amLODipine  5 mg Oral Daily   aspirin EC  81 mg Oral Daily   enoxaparin (LOVENOX) injection  40 mg Subcutaneous Q24H   fluticasone  2 puff Inhalation BID   guaiFENesin  600 mg Oral BID   insulin aspart  0-5 Units Subcutaneous QHS   insulin aspart  0-9 Units Subcutaneous TID WC    lisinopril  20 mg Oral Daily   methylPREDNISolone (SOLU-MEDROL) injection  80 mg Intravenous Daily   multivitamin with minerals  1 tablet Oral Daily   nirmatrelvir/ritonavir EUA (renal dosing)  2 tablet Oral BID   Continuous Infusions:  sodium chloride       LOS: 2 days    Time  spent: 25 mins    Wyvonnia Dusky, MD [facility] Pager [phone]  If 7PM-7AM, please contact night-coverage 11/14/2021, 7:45 AM

## 2021-11-15 ENCOUNTER — Inpatient Hospital Stay: Payer: Medicare HMO

## 2021-11-15 DIAGNOSIS — I10 Essential (primary) hypertension: Secondary | ICD-10-CM

## 2021-11-15 DIAGNOSIS — E7849 Other hyperlipidemia: Secondary | ICD-10-CM

## 2021-11-15 DIAGNOSIS — U071 COVID-19: Secondary | ICD-10-CM | POA: Diagnosis not present

## 2021-11-15 DIAGNOSIS — E876 Hypokalemia: Secondary | ICD-10-CM | POA: Diagnosis not present

## 2021-11-15 DIAGNOSIS — J441 Chronic obstructive pulmonary disease with (acute) exacerbation: Secondary | ICD-10-CM | POA: Diagnosis not present

## 2021-11-15 DIAGNOSIS — J40 Bronchitis, not specified as acute or chronic: Secondary | ICD-10-CM | POA: Diagnosis not present

## 2021-11-15 LAB — CBC WITH DIFFERENTIAL/PLATELET
Abs Immature Granulocytes: 0.06 10*3/uL (ref 0.00–0.07)
Basophils Absolute: 0 10*3/uL (ref 0.0–0.1)
Basophils Relative: 0 %
Eosinophils Absolute: 0 10*3/uL (ref 0.0–0.5)
Eosinophils Relative: 0 %
HCT: 39.2 % (ref 36.0–46.0)
Hemoglobin: 13.4 g/dL (ref 12.0–15.0)
Immature Granulocytes: 1 %
Lymphocytes Relative: 4 %
Lymphs Abs: 0.4 10*3/uL — ABNORMAL LOW (ref 0.7–4.0)
MCH: 29.9 pg (ref 26.0–34.0)
MCHC: 34.2 g/dL (ref 30.0–36.0)
MCV: 87.5 fL (ref 80.0–100.0)
Monocytes Absolute: 0.4 10*3/uL (ref 0.1–1.0)
Monocytes Relative: 4 %
Neutro Abs: 10 10*3/uL — ABNORMAL HIGH (ref 1.7–7.7)
Neutrophils Relative %: 91 %
Platelets: 192 10*3/uL (ref 150–400)
RBC: 4.48 MIL/uL (ref 3.87–5.11)
RDW: 12.9 % (ref 11.5–15.5)
WBC: 10.9 10*3/uL — ABNORMAL HIGH (ref 4.0–10.5)
nRBC: 0 % (ref 0.0–0.2)

## 2021-11-15 LAB — GLUCOSE, CAPILLARY
Glucose-Capillary: 319 mg/dL — ABNORMAL HIGH (ref 70–99)
Glucose-Capillary: 235 mg/dL — ABNORMAL HIGH (ref 70–99)
Glucose-Capillary: 278 mg/dL — ABNORMAL HIGH (ref 70–99)
Glucose-Capillary: 316 mg/dL — ABNORMAL HIGH (ref 70–99)
Glucose-Capillary: 302 mg/dL — ABNORMAL HIGH (ref 70–99)

## 2021-11-15 LAB — BASIC METABOLIC PANEL
Anion gap: 7 (ref 5–15)
BUN: 14 mg/dL (ref 8–23)
CO2: 25 mmol/L (ref 22–32)
Calcium: 8.4 mg/dL — ABNORMAL LOW (ref 8.9–10.3)
Chloride: 96 mmol/L — ABNORMAL LOW (ref 98–111)
Creatinine, Ser: 0.83 mg/dL (ref 0.44–1.00)
GFR, Estimated: >60 mL/min (ref >60)
Glucose, Bld: 225 mg/dL — ABNORMAL HIGH (ref 70–99)
Potassium: 4.6 mmol/L (ref 3.5–5.1)
Sodium: 128 mmol/L — ABNORMAL LOW (ref 135–145)

## 2021-11-15 MED ORDER — PANTOPRAZOLE SODIUM 40 MG PO TBEC
40.0000 mg | DELAYED_RELEASE_TABLET | Freq: Every day | ORAL | Status: DC
  Administered 2021-11-16 – 2021-11-21 (×6): 40 mg via ORAL
  Filled 2021-11-15 (×6): qty 1

## 2021-11-15 MED ORDER — POLYETHYLENE GLYCOL 3350 17 G PO PACK: 17.0000 g | PACK | Freq: Every day | ORAL | Status: DC

## 2021-11-15 MED ORDER — INSULIN GLARGINE-YFGN 100 UNIT/ML ~~LOC~~ SOLN
20.0000 [IU] | Freq: Every day | SUBCUTANEOUS | Status: DC
  Administered 2021-11-16 – 2021-11-18 (×3): 20 [IU] via SUBCUTANEOUS
  Filled 2021-11-15 (×4): qty 0.2

## 2021-11-15 MED ORDER — DOCUSATE SODIUM 100 MG PO CAPS
100.0000 mg | ORAL_CAPSULE | Freq: Every day | ORAL | Status: DC
  Administered 2021-11-16: 100 mg via ORAL
  Filled 2021-11-15: qty 1

## 2021-11-15 MED ORDER — PANTOPRAZOLE SODIUM 40 MG IV SOLR
40.0000 mg | Freq: Once | INTRAVENOUS | Status: AC
  Administered 2021-11-15: 40 mg via INTRAVENOUS
  Filled 2021-11-15: qty 10

## 2021-11-15 MED ORDER — AMLODIPINE BESYLATE 10 MG PO TABS
10.0000 mg | ORAL_TABLET | Freq: Every day | ORAL | Status: DC
  Administered 2021-11-16 – 2021-11-21 (×6): 10 mg via ORAL
  Filled 2021-11-15 (×6): qty 1

## 2021-11-15 MED ORDER — ALUM & MAG HYDROXIDE-SIMETH 200-200-20 MG/5ML PO SUSP
30.0000 mL | ORAL | Status: DC | PRN
  Administered 2021-11-15: 30 mL via ORAL
  Filled 2021-11-15 (×2): qty 30

## 2021-11-15 MED ORDER — SIMETHICONE 80 MG PO CHEW
160.0000 mg | CHEWABLE_TABLET | Freq: Once | ORAL | Status: AC
  Administered 2021-11-16: 160 mg via ORAL
  Filled 2021-11-15: qty 2

## 2021-11-15 NOTE — Progress Notes (Signed)
PIV consult: MAR reviewed, appears all IV medications/ fluids have been discontinued. No IV needed at this time. Discussed with RN, she will enter consult if new IV meds are needed.  ?

## 2021-11-15 NOTE — Progress Notes (Signed)
?PROGRESS NOTE ? ?Kendra Clarke    DOB: 06-13-1941, 81 y.o.  ?[id number]  ?  Code Status: Full Code   ?DOA: 11/12/2021   LOS: 3  ? ?Brief hospital course  ?Kendra Clarke is a 81 y.o. female with a PMH significant for HTN, HLD, type II DM, COPD, asthma, varicose veins, ventral hernia, superficial thrombophlebitis. ?They presented from home to the ED on 11/12/2021 with cough, SOB, N/V/D x 7 days.  ?In the ED, it was found that they had COVID+, hyponatremia, bronchitis changes on chest xray. Had negative head CT  ?They were treated with paxlovid, steroids, IV fluids, and supportive care.  ?Patient was admitted to medicine service for further workup and management of acute respiratory disorder without hypoxia as outlined in detail below. ? ?11/15/21 -stable, improved ?Patient had acute pain and swelling of right arm. No IV fluids running in this arm. ? ?Assessment & Plan  ?Principal Problem: ?  Acute respiratory disease due to COVID-19 virus ?Active Problems: ?  Sepsis ([place]) ?  HTN (hypertension) ?  HLD (hyperlipidemia) ?  COPD exacerbation ([place]) ?  Diabetes mellitus without complication ([place]) ?  Fall at home, initial encounter ?  Hyponatremia ?  Hypokalemia ?  Disorder of electrolytes ?  Elevated troponin ?  Nausea & vomiting ?  Hypomagnesemia ? ?Acute respiratory disease and sepsis due to COVID-19 virus- remains stable on 2-3L [place] for comfort. ?- continue steroids, paxlovid ?- supportive care PRN ?- wean to room air ?- pulse ox with ambulation ? ?Asymmetrical R arm swelling- new ?- analgesia PRN ?- RUE duplex to r/o clot. H/o phlebitis. No IV fluids running in this arm ?  ?COPD exacerbation ([place]): trigged by covid infection ?-Bronchodilators and Solu-Medrol as above ?  ?Fall at home, initial encounter: CT head negative. ?-PT/OT ?  ?HTN (hypertension) ?-continue home Amlodipine ?-Hold HCTZ and lisinopril due to hyponatremia ?  ?HLD (hyperlipidemia) ?-Hold Crestor due to Paxlovid use ?  ?Diabetes mellitus without  complication [facility]): Patient is taking Trulicity at home ?-Sliding scale insulin ?  ?Hyponatremia: Sodium 121>128 ?-Hold lisinopril and HCTZ ?-BMP am.  ?  ?Disorder of electrolytes including hypokalemia, hypomagnesemia, hyponatremia ?-Repleted potassium and magnesium ?  ?Elevated troponin: Troponin level 21, no chest pain, likely demand ischemia ?-continue Aspirin ?  ?Nausea & vomiting and diarrhea: Most likely due to COVID infection.  No fever or leukocytosis.  Low suspicions for C. Difficile. Improved. Able to tolerate increased doet ?-As needed Imodium ? ?Body mass index is 29.18 kg/m?. ? ?VTE ppx: enoxaparin (LOVENOX) injection 40 mg Start: 11/12/21 1000 ? ? ?Diet:  ?   ?Diet  ? Diet Carb Modified Fluid consistency: Thin; Room service appropriate? Yes  ? ?Subjective 11/15/21   ? ?Pt reports improvement in her symptoms. Continues to have cough. Able to tolerate diet.  ?  ?Objective  ? ?Vitals:  ? 11/14/21 2110 11/15/21 0454 11/15/21 0539 11/15/21 0558  ?BP: (!) 165/86 (!) 176/84 (!) 169/74 (!) 163/71  ?Pulse: (!) 101 95 91 100  ?Resp: 18 18    ?Temp: 97.8 ?F (36.6 ?C) 98 ?F (36.7 ?C)    ?TempSrc: Oral Oral    ?SpO2: 99% 99% 100% 98%  ?Weight:      ?Height:      ? ? ?Intake/Output Summary (Last 24 hours) at 11/15/2021 0739 ?Last data filed at 11/15/2021 0600 ?Gross per 24 hour  ?Intake 1397.89 ml  ?Output --  ?Net 1397.89 ml  ? ?Filed Weights  ? 11/12/21 0554  ?  Weight: 77.1 kg  ?  ? ?Physical Exam:  ?General: awake, alert, NAD ?HEENT: atraumatic, clear conjunctiva, anicteric sclera, MMM, hearing grossly normal ?Respiratory: normal respiratory effort. ?Cardiovascular: normal S1/S2, RRR, no JVD, murmurs, quick capillary refill  ?Gastrointestinal: soft, NT, ND ?Nervous: A&O x3. no gross focal neurologic deficits, normal speech ?Extremities: moves all equally, generalized edema, normal tone ?Skin: dry, intact, normal temperature, normal color. No rashes, lesions or ulcers on exposed skin ?Psychiatry: normal mood, congruent  affect ? ?Labs   ?I have personally reviewed the following labs and imaging studies ?CBC ?   ?Component Value Date/Time  ? WBC 10.9 (H) 11/15/2021 [id number]  ? RBC 4.48 11/15/2021 0614  ? HGB 13.4 11/15/2021 0614  ? HGB 13.1 10/11/2013 0441  ? HCT 39.2 11/15/2021 0614  ? HCT 39.9 10/11/2013 0441  ? PLT 192 11/15/2021 0614  ? PLT 247 10/11/2013 0441  ? MCV 87.5 11/15/2021 0614  ? MCV 92 10/11/2013 0441  ? MCH 29.9 11/15/2021 0614  ? MCHC 34.2 11/15/2021 0614  ? RDW 12.9 11/15/2021 0614  ? RDW 14.1 10/11/2013 0441  ? LYMPHSABS 0.4 (L) 11/15/2021 [id number]  ? LYMPHSABS 1.0 10/11/2013 0441  ? MONOABS 0.4 11/15/2021 0614  ? MONOABS 0.3 10/11/2013 0441  ? EOSABS 0.0 11/15/2021 0614  ? EOSABS 0.0 10/11/2013 0441  ? BASOSABS 0.0 11/15/2021 [id number]  ? BASOSABS 0.0 10/11/2013 0441  ? ?BMP Latest Ref Rng & Units 11/15/2021 11/14/2021 11/14/2021  ?Glucose 70 - 99 mg/dL 225(H) - 228(H)  ?BUN 8 - 23 mg/dL 14 - 11  ?Creatinine 0.44 - 1.00 mg/dL 0.83 - 0.73  ?Sodium 135 - 145 mmol/L 128(L) 127(L) 126(L)  ?Potassium 3.5 - 5.1 mmol/L 4.6 - 4.3  ?Chloride 98 - 111 mmol/L 96(L) - 93(L)  ?CO2 22 - 32 mmol/L 25 - 25  ?Calcium 8.9 - 10.3 mg/dL 8.4(L) - 8.1(L)  ? ? ?No results found. ? ?Disposition Plan & Communication  ?Patient status: Inpatient  ?Admitted From: Home ?Planned disposition location: Home health ?Anticipated discharge date: 3/2 pending improvement in Na+, r/u UE VTE with [place], oxygen wean ? ?Family Communication: none  ?  ?Author: ?Richarda Osmond, DO ?[facility] ?11/15/2021, 7:39 AM  ? ?Available by Epic secure chat 7AM-7PM. ?If 7PM-7AM, please contact night-coverage.  ?[facility] contact information found on [email or web address]. ? ?

## 2021-11-15 NOTE — Progress Notes (Signed)
Rapid Response Event Note  ? ?Reason for Call : chest pain ? ? ?Initial Focused Assessment: On my arrival pt is alert and oriented and c/o mid epigastric pain. She states she has had this type of pain before but never this severe. She also states that her last BM was on Sunday. VSS on 2L [place].  ? ? ?Interventions: Primary RN administering malox per PRN order. EKG ordered and performed per rapid order set. Manuela Schwartz, NP paged and present at bedside. ? ? ?Plan of Care: Pt will remain on 1C. Manuela Schwartz, NP ordering KUB. RRT will follow up with primary RN for any needs. ? ? ? ?Event Summary:  ? ?MD Notified: Manuela Schwartz, NP ?Call Time: 1948 ?Arrival Time: 1950 ?End Time: 2005 ? ?Henrene Dodge, RN ?

## 2021-11-15 NOTE — Progress Notes (Signed)
?   11/15/21 2100  ?Clinical Encounter Type  ?Visited With Patient  ?Visit Type Initial  ?Referral From Nurse  ?Consult/Referral To Chaplain  ? ? ?Chaplain responded to nurse consult. Chaplain provided emotional and spiritual support. Chaplain supported patient with the use of story telling to bring meaning to her life and situation. Patient appreciated Chaplain visit. Chaplain offered follow up care as needed and/or requested. ?

## 2021-11-15 NOTE — Progress Notes (Signed)
Physical Therapy Treatment ?Patient Details ?Name: Kendra Clarke ?MRN: [id number] ?DOB: 1941-08-27 ?Today's Date: 11/15/2021 ? ? ?History of Present Illness Pt is an 81 y.o. female presenting to hospital 2/26 with c/o SOB, cough, fatigue, increasing weakness, nausea, difficulty tolerating p.o., diarrhea, and significant lightheadedness with standing causing her to fall several times.  Pt admitted with acute respiratory disease and sepsis d/t COVID-19 virus, COPD exacerbation, leukopenia, and thrombocyptopenia.  PMH includes sepsis, DM, htn, HLD, DM, COPD, large ventral hernia, and superficial thrombophlebitis. ? ?  ?PT Comments  ? ? Pt was seated in recliner upon arriving. She is A and O x 4 and motivated to DC home."Ill do whatever I need to do to get home. My family needs me." Pt was easily and safely able to stand and ambulate in room with O2. Author attempted to wean O2 during session however poor pleth reading. Elected to leave O2 on due to poor signal accuracy. Pt was not on o2 at baseline however continues to present with productive cough. Recommend changing pulse oximeter to earlobe with dynamap for pleth accuracy. Pt would benefit from HHPT however currently refusing at this time. She was sitting in recliner post session with call bell in reach and pt reporting feeling well.  ? ?  ?Recommendations for follow up therapy are one component of a multi-disciplinary discharge planning process, led by the attending physician.  Recommendations may be updated based on patient status, additional functional criteria and insurance authorization. ? ?Follow Up Recommendations ? Other (comment) (pt refuses post admission PT. " I'll rehab myself at home." Author recommends HHPT but pt is unwilling.) ?  ?  ?Assistance Recommended at Discharge PRN  ?Patient can return home with the following A little help with walking and/or transfers;Assistance with cooking/housework;Assist for transportation;Help with stairs or ramp for  entrance ?  ?Equipment Recommendations ? None recommended by PT (pt states she has RW at home already)  ?  ?   ?Precautions / Restrictions Precautions ?Precautions: Fall ?Restrictions ?Weight Bearing Restrictions: No  ?  ? ?Mobility ? Bed Mobility ?Overal bed mobility: Needs Assistance ?Bed Mobility: Supine to Sit ?  ?  ?Supine to sit: Supervision ?Sit to supine: Supervision ?  ?  ?  ? ?Transfers ?Overall transfer level: Needs assistance ?Equipment used: 1 person hand held assist ?Transfers: Sit to/from Stand ?Sit to Stand: Supervision ?  ?  ?  ?  ?  ?  ?  ? ?Ambulation/Gait ?Ambulation/Gait assistance: Supervision ?Gait Distance (Feet): 75 Feet ?Assistive device: Rolling walker (2 wheels) ?Gait Pattern/deviations: Step-through pattern ?Gait velocity: decreased ?  ?  ?General Gait Details: pt was able to safely ambulate in room with RW ~ 75 ft. no LOB or safety concern. O2 > 88% on 2 L. poor pleth reading without O2 so eleceted to leave O2 on during ambulation. ? ? ?  ?Balance Overall balance assessment: Needs assistance ?Sitting-balance support: No upper extremity supported, Feet supported ?Sitting balance-Leahy Scale: Good ?  ?  ?Standing balance support: Bilateral upper extremity supported, During functional activity ?Standing balance-Leahy Scale: Fair ?  ?  ?  ?  ?Cognition Arousal/Alertness: Awake/alert ?Behavior During Therapy: [facility] for tasks assessed/performed ?Overall Cognitive Status: Within Functional Limits for tasks assessed ?  ?   ?General Comments: Pt is A and O x 4 ?  ?  ? ?  ?   ?   ? ?Pertinent Vitals/Pain Pain Assessment ?Pain Assessment: No/denies pain  ? ? ? ?PT Goals (current goals can now be  found in the care plan section) Acute Rehab PT Goals ?Patient Stated Goal: to improve breathing and go home ?Progress towards PT goals: Progressing toward goals ? ?  ?Frequency ? ? ? Min 2X/week ? ? ? ?  ?PT Plan Current plan remains appropriate  ? ? ?   ?AM-PAC PT "6 Clicks" Mobility   ?Outcome Measure ?  Help needed turning from your back to your side while in a flat bed without using bedrails?: None ?Help needed moving from lying on your back to sitting on the side of a flat bed without using bedrails?: A Little ?Help needed moving to and from a bed to a chair (including a wheelchair)?: A Little ?Help needed standing up from a chair using your arms (e.g., wheelchair or bedside chair)?: A Little ?Help needed to walk in hospital room?: A Little ?Help needed climbing 3-5 steps with a railing? : A Little ?6 Click Score: 19 ? ?  ?End of Session Equipment Utilized During Treatment: Gait belt;Oxygen ?Activity Tolerance: Patient limited by fatigue ?Patient left: in chair;with call bell/phone within reach;with chair alarm set ?Nurse Communication: Mobility status;Precautions ?PT Visit Diagnosis: Unsteadiness on feet (R26.81);Muscle weakness (generalized) (M62.81);History of falling (Z91.81) ?  ? ? ?Time: 0940-1001 ?PT Time Calculation (min) (ACUTE ONLY): 21 min ? ?Charges:  $Gait Training: 8-22 mins          ?          ? ?Jetta Lout PTA ?11/15/21, 10:52 AM  ?

## 2021-11-15 NOTE — TOC Initial Note (Signed)
Transition of Care (TOC) - Initial/Assessment Note  ? ? ?Patient Details  ?Name: Kendra Clarke ?MRN: [id number] ?Date of Birth: October 26, 1940 ? ?Transition of Care (TOC) CM/SW Contact:    ?Caryn Section, RN ?Phone Number: ?11/15/2021, 3:38 PM ? ?Clinical Narrative:   Patient lives at home with family.  She states her son assists her at home. ? ?Patient states she is current with appointments, except the fact that she did not consent to COVID vaccination. ? ?Patient states she is currently assisting her granddaughter with life problems, but she feels safe returning to her home at this time, as her granddaughter does not live with her. ? ?Patient refuses home health, outpatient or other rehabilitation, she states "I will be fine at home".   She also states she has all DME at home and does not need anything at this time. ? ?Offered supportive dialogue to patient and patient was grateful for time to discuss.  Patient asked for chaplain to come by to talk further to her.  Chaplain paged.   ? ? ?TOC contact information provided.          ? ? ?Expected Discharge Plan: Home/Self Care (patient refuses SNF, Home Health, outpatient therapy) ?Barriers to Discharge: Continued Medical Work up ? ? ?Patient Goals and CMS Choice ?  ?  ?  ? ?Expected Discharge Plan and Services ?Expected Discharge Plan: Home/Self Care (patient refuses SNF, Home Health, outpatient therapy) ?  ?Discharge Planning Services: CM Consult ?Post Acute Care Choice: NA ?Living arrangements for the past 2 months: Single Family Home ?                ?  ?  ?  ?  ?  ?  ?  ?  ?  ?  ? ?Prior Living Arrangements/Services ?Living arrangements for the past 2 months: Single Family Home ?Lives with:: Self, Relatives ?Patient language and need for interpreter reviewed:: Yes (No interpreter required) ?Do you feel safe going back to the place where you live?: Yes      ?Need for Family Participation in Patient Care: Yes (Comment) ?Care giver support system in place?: Yes  (comment) ?Current home services: DME (3 walkers, 3 canes, tub seat and rails,) ?Criminal Activity/Legal Involvement Pertinent to Current Situation/Hospitalization: No - Comment as needed ? ?Activities of Daily Living ?Home Assistive Devices/Equipment: None ?ADL Screening (condition at time of admission) ?Patient's cognitive ability adequate to safely complete daily activities?: Yes ?Is the patient deaf or have difficulty hearing?: No ?Does the patient have difficulty seeing, even when wearing glasses/contacts?: No ?Does the patient have difficulty concentrating, remembering, or making decisions?: Yes ?Patient able to express need for assistance with ADLs?: Yes ?Does the patient have difficulty dressing or bathing?: No ?Independently performs ADLs?: Yes (appropriate for developmental age) ?Does the patient have difficulty walking or climbing stairs?: Yes ?Weakness of Legs: Both ?Weakness of Arms/Hands: None ? ?Permission Sought/Granted ?Permission sought to share information with : Case Manager ?Permission granted to share information with : Yes, Verbal Permission Granted ?   ?   ?   ?   ? ?Emotional Assessment ?Appearance:: Appears stated age ?Attitude/Demeanor/Rapport: Gracious, Engaged ?Affect (typically observed): Pleasant, Appropriate ?Orientation: : Oriented to Self, Oriented to Place, Oriented to  Time, Oriented to Situation ?Alcohol / Substance Use: Not Applicable ?Psych Involvement: No (comment) ? ?Admission diagnosis:  Acute bronchitis [J20.9] ?Hypokalemia [E87.6] ?Hyponatremia [E87.1] ?Bronchitis [J40] ?COPD exacerbation (H[facility]) [J44.1] ?Patient Active Problem List  ? Diagnosis Date Noted  ? Acute  bronchitis 11/12/2021  ? HTN (hypertension) 11/12/2021  ? HLD (hyperlipidemia) 11/12/2021  ? COPD exacerbation ([facility]) 11/12/2021  ? Diabetes mellitus without complication ([facility]) 11/12/2021  ? Fall at home, initial encounter 11/12/2021  ? Hyponatremia 11/12/2021  ? Hypokalemia 11/12/2021  ? Disorder of electrolytes  11/12/2021  ? Elevated troponin 11/12/2021  ? Nausea & vomiting 11/12/2021  ? Hypomagnesemia 11/12/2021  ? Acute respiratory disease due to COVID-19 virus 11/12/2021  ? Varicose veins of leg with pain, 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  ? Superficial thrombophlebitis 01/13/2021  ? Essential hypertension, benign 01/13/2021  ? Diabetes (HCC) 01/13/2021  ? Grief at loss of child 12/02/2017  ? Sepsis (HCC) 12/01/2017  ? ?PCP:  Shane Crutch, PA ?Pharmacy:   ?[facility] - [place], [place] - [address] ?[address] ?[place] [address] ?Phone: [phone] Fax: [phone] ? ?[facility] - [place] ([place]), [place] - [address] ?[address] ?[place] [address] ?Phone: [phone] Fax: [phone] ? ? ? ? ?Social Determinants of Health (SDOH) Interventions ?  ? ?Readmission Risk Interventions ?No flowsheet data found. ? ? ?

## 2021-11-15 NOTE — Progress Notes (Signed)
Cross Cover ?Responded to RAPID response for patient due to "chest pain".  Pain located mid epigastric region and patient states she has had this pain before in the past but not this bad..  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   ?Also nurse reports mouth pain and white coating. ?Right arm edema which ultrasound is still ordered to further evaluate ? ?EKG NSR with ST changes ? ?Nystatin for thrush ?KUB r/o obstruction - if negative, discussed with patient [place] of daily bulking agent to improve motility.  States she has only used stool softener in past but is willing to try.  ?Also start PPI - Could be worsening GERD symptoms in setting of steroid use ?

## 2021-11-15 NOTE — Progress Notes (Signed)
Occupational Therapy Treatment ?Patient Details ?Name: Kendra Clarke ?MRN: [id number] ?DOB: 07-06-1941 ?Today's Date: 11/15/2021 ? ? ?History of present illness Pt is an 81 y.o. female presenting to hospital 2/26 with c/o SOB, cough, fatigue, increasing weakness, nausea, difficulty tolerating p.o., diarrhea, and significant lightheadedness with standing causing her to fall several times.  Pt admitted with acute respiratory disease and sepsis d/t COVID-19 virus, COPD exacerbation, leukopenia, and thrombocyptopenia.  PMH includes sepsis, DM, htn, HLD, DM, COPD, large ventral hernia, and superficial thrombophlebitis. ?  ?OT comments ? Upon entering the room, pt seated in recliner chair with no c/o pain and agreeable to OT intervention. She does report feeling very fatigued and wanting to return to bed. Pt standing with close supervision and transferring to [facility] to urinate and have possible BM. Pt unsuccessful with BM but while sitting on toilet we discussed her home set up further where reports granddaughter to assist at discharge. She also reports grandchild has "drug problems" and this therapist is concerned that she may not provide assist as needed to pt at discharge. Team notified . Pt stands with close supervision and performs her clothing management and hygiene before returning to bed. All needs within reach.  ? ?Recommendations for follow up therapy are one component of a multi-disciplinary discharge planning process, led by the attending physician.  Recommendations may be updated based on patient status, additional functional criteria and insurance authorization. ?   ?Follow Up Recommendations ? Home health OT  ?  ?Assistance Recommended at Discharge Intermittent Supervision/Assistance  ?Patient can return home with the following ? A little help with walking and/or transfers;A little help with bathing/dressing/bathroom;Help with stairs or ramp for entrance;Assist for transportation;Assistance with  cooking/housework ?  ?Equipment Recommendations ? BSC/3in1  ?  ?   ?Precautions / Restrictions Precautions ?Precautions: Fall ?Restrictions ?Weight Bearing Restrictions: No  ? ? ?  ? ?Mobility Bed Mobility ?Overal bed mobility: Needs Assistance ?Bed Mobility: Sit to Supine ?  ?  ?  ?Sit to supine: Supervision ?  ?General bed mobility comments: mild increased effort to perform on own ?  ? ?Transfers ?Overall transfer level: Needs assistance ?Equipment used: None ?Transfers: Sit to/from Stand, Bed to chair/wheelchair/BSC ?Sit to Stand: Supervision ?  ?  ?Step pivot transfers: Supervision ?  ?  ?  ?  ?  ?Balance Overall balance assessment: Needs assistance ?Sitting-balance support: No upper extremity supported, Feet supported ?Sitting balance-Leahy Scale: Good ?  ?  ?Standing balance support: Bilateral upper extremity supported, During functional activity ?Standing balance-Leahy Scale: Good ?  ?  ?  ?  ?  ?  ?  ?  ?  ?  ?  ?  ?   ? ?ADL either performed or assessed with clinical judgement  ? ?ADL Overall ADL's : Needs assistance/impaired ?  ?  ?Grooming: Wash/dry hands;Standing;Supervision/safety ?  ?  ?  ?  ?  ?  ?  ?  ?  ?Toilet Transfer: Supervision/safety;Ambulation;BSC/3in1 ?  ?Toileting- Clothing Manipulation and Hygiene: Supervision/safety;Sit to/from stand ?  ?  ?  ?Functional mobility during ADLs: Supervision/safety ?  ?  ? ?Extremity/Trunk Assessment Upper Extremity Assessment ?Upper Extremity Assessment: Generalized weakness ?  ?Lower Extremity Assessment ?Lower Extremity Assessment: Generalized weakness ?  ?Cervical / Trunk Assessment ?Cervical / Trunk Assessment: Normal ?  ? ?Vision Patient Visual Report: No change from baseline ?  ?  ?   ?   ? ?Cognition Arousal/Alertness: Awake/alert ?Behavior During Therapy: [facility] for tasks assessed/performed ?Overall Cognitive Status: Within  Functional Limits for tasks assessed ?  ?  ?  ?  ?  ?  ?  ?  ?  ?  ?  ?  ?  ?  ?  ?  ?General Comments: Pt is A and O x 4 ?  ?  ?    ?   ?   ?   ? ? ?Pertinent Vitals/ Pain       Pain Assessment ?Pain Assessment: No/denies pain ? ?   ?   ? ?Frequency ? Min 2X/week  ? ? ? ? ?  ?Progress Toward Goals ? ?OT Goals(current goals can now be found in the care plan section) ? Progress towards OT goals: Progressing toward goals ? ?Acute Rehab OT Goals ?Patient Stated Goal: to go home with family ?OT Goal Formulation: With patient ?Time For Goal Achievement: 11/27/21 ?Potential to Achieve Goals: Fair  ?Plan Discharge plan remains appropriate;Frequency remains appropriate   ? ?   ?AM-PAC OT "6 Clicks" Daily Activity     ?Outcome Measure ? ? Help from another person eating meals?: None ?Help from another person taking care of personal grooming?: None ?Help from another person toileting, which includes using toliet, bedpan, or urinal?: A Little ?Help from another person bathing (including washing, rinsing, drying)?: A Little ?Help from another person to put on and taking off regular upper body clothing?: None ?Help from another person to put on and taking off regular lower body clothing?: A Little ?6 Click Score: 21 ? ?  ?End of Session   ? ?OT Visit Diagnosis: Unsteadiness on feet (R26.81);Muscle weakness (generalized) (M62.81) ?  ?Activity Tolerance Patient tolerated treatment well ?  ?Patient Left in bed;with call bell/phone within reach;with bed alarm set ?  ?Nurse Communication Mobility status;Other (comment) (home situation per pt report) ?  ? ?   ? ?Time: [id number] ?OT Time Calculation (min): 31 min ? ?Charges: OT General Charges ?$OT Visit: 1 Visit ?OT Treatments ?$Self Care/Home Management : 23-37 mins ? ?Darleen Crocker, [place], OTR/L , CBIS ?ascom [phone]  ?11/15/21, 3:32 PM  ?

## 2021-11-16 ENCOUNTER — Inpatient Hospital Stay: Payer: Medicare HMO

## 2021-11-16 DIAGNOSIS — R1013 Epigastric pain: Secondary | ICD-10-CM

## 2021-11-16 DIAGNOSIS — R109 Unspecified abdominal pain: Secondary | ICD-10-CM

## 2021-11-16 DIAGNOSIS — U071 COVID-19: Secondary | ICD-10-CM | POA: Diagnosis not present

## 2021-11-16 DIAGNOSIS — E876 Hypokalemia: Secondary | ICD-10-CM | POA: Diagnosis not present

## 2021-11-16 DIAGNOSIS — J441 Chronic obstructive pulmonary disease with (acute) exacerbation: Secondary | ICD-10-CM | POA: Diagnosis not present

## 2021-11-16 LAB — BASIC METABOLIC PANEL
Anion gap: 8 (ref 5–15)
BUN: 21 mg/dL (ref 8–23)
CO2: 23 mmol/L (ref 22–32)
Calcium: 8.9 mg/dL (ref 8.9–10.3)
Chloride: 95 mmol/L — ABNORMAL LOW (ref 98–111)
Creatinine, Ser: 1.03 mg/dL — ABNORMAL HIGH (ref 0.44–1.00)
GFR, Estimated: 55 mL/min — ABNORMAL LOW (ref >60)
Glucose, Bld: 262 mg/dL — ABNORMAL HIGH (ref 70–99)
Potassium: 5.1 mmol/L (ref 3.5–5.1)
Sodium: 126 mmol/L — ABNORMAL LOW (ref 135–145)

## 2021-11-16 LAB — GLUCOSE, CAPILLARY
Glucose-Capillary: 241 mg/dL — ABNORMAL HIGH (ref 70–99)
Glucose-Capillary: 256 mg/dL — ABNORMAL HIGH (ref 70–99)
Glucose-Capillary: 270 mg/dL — ABNORMAL HIGH (ref 70–99)
Glucose-Capillary: 237 mg/dL — ABNORMAL HIGH (ref 70–99)

## 2021-11-16 MED ORDER — ONDANSETRON HCL 4 MG/2ML IJ SOLN
4.0000 mg | Freq: Three times a day (TID) | INTRAMUSCULAR | Status: DC | PRN
  Administered 2021-11-16 – 2021-11-17 (×3): 4 mg via INTRAVENOUS
  Filled 2021-11-16 (×3): qty 2

## 2021-11-16 MED ORDER — SIMETHICONE 80 MG PO CHEW
160.0000 mg | CHEWABLE_TABLET | Freq: Once | ORAL | Status: AC
  Administered 2021-11-18: 160 mg via ORAL
  Filled 2021-11-16: qty 2

## 2021-11-16 MED ORDER — NYSTATIN 100000 UNIT/ML MT SUSP
5.0000 mL | Freq: Four times a day (QID) | OROMUCOSAL | Status: DC
  Administered 2021-11-16 – 2021-11-18 (×8): 500000 [IU] via ORAL
  Filled 2021-11-16 (×10): qty 5

## 2021-11-16 MED ORDER — SENNA 8.6 MG PO TABS
1.0000 | ORAL_TABLET | Freq: Every day | ORAL | Status: DC
  Administered 2021-11-16 – 2021-11-18 (×3): 8.6 mg via ORAL
  Filled 2021-11-16 (×3): qty 1

## 2021-11-16 MED ORDER — ALBUMIN HUMAN 25 % IV SOLN
12.5000 g | Freq: Two times a day (BID) | INTRAVENOUS | Status: DC
  Administered 2021-11-16 – 2021-11-21 (×10): 12.5 g via INTRAVENOUS
  Filled 2021-11-16 (×12): qty 50

## 2021-11-16 MED ORDER — FUROSEMIDE 20 MG PO TABS
10.0000 mg | ORAL_TABLET | Freq: Two times a day (BID) | ORAL | Status: DC
Start: 2021-11-16 — End: 2021-11-21
  Administered 2021-11-16 – 2021-11-21 (×9): 10 mg via ORAL
  Filled 2021-11-16 (×10): qty 1

## 2021-11-16 MED ORDER — POLYETHYLENE GLYCOL 3350 17 G PO PACK
17.0000 g | PACK | Freq: Two times a day (BID) | ORAL | Status: DC
  Administered 2021-11-16 – 2021-11-18 (×4): 17 g via ORAL
  Filled 2021-11-16 (×5): qty 1

## 2021-11-16 MED ORDER — ORAL CARE MOUTH RINSE
15.0000 mL | Freq: Two times a day (BID) | OROMUCOSAL | Status: DC
  Administered 2021-11-16 – 2021-11-21 (×8): 15 mL via OROMUCOSAL

## 2021-11-16 MED ORDER — SORBITOL 70 % SOLN
960.0000 mL | TOPICAL_OIL | Freq: Once | ORAL | Status: DC | PRN
  Filled 2021-11-16: qty 473

## 2021-11-16 NOTE — Consult Note (Signed)
[facility]  CONSULT NOTE    Date: 11/16/2021                  Patient Name:  Kendra Clarke  MRN: [id number]  DOB: Feb 27, 1941  Age / Sex: 81 y.o., female         PCP: Shane Crutch, PA                 Service Requesting Consult: TRH                 Reason for Consult: Hyponatremia            History of Present Illness: Ms. Kendra Clarke is a 81 y.o.  female with medical conditions including hypertension, diabetes, hyperlipidemia, asthma, superficial thrombophlebitis, who was admitted to [facility] on 11/12/2021 for Acute bronchitis [J20.9] Hypokalemia [E87.6] Hyponatremia [E87.1] Bronchitis [J40] COPD exacerbation (HCC) [J44.1]  Patient presents to the emergency department with complaints of shortness of breath, nausea, vomiting, diarrhea and cough for a week.  Patient seen resting in bed quietly, drowsy.  History obtained via chart review.  No family at bedside.  Patient reports progression of illness for 1 week.  Currently denies abdominal pain.  No evidence of fever or chills.  Reports decreased appetite and oral intake.  Progressive weakness that resulted in a fall on admission today.  Labs on admission have sodium 121.  Previously treated with IV fluids and fluid restriction.  Recovered to 129 however has decreased to 126 today. Abdominal x-ray negative.   Medications: Outpatient medications: Medications Prior to Admission  Medication Sig Dispense Refill Last Dose   amLODipine (NORVASC) 10 MG tablet Take 10 mg by mouth daily.   11/12/2021   ATROVENT HFA 17 MCG/ACT inhaler Inhale 2 puffs into the lungs every 4 (four) hours as needed for wheezing. 1 Inhaler 1 11/11/2021   hydrochlorothiazide (MICROZIDE) 12.5 MG capsule 1 by mouth daily for high blood pressure   11/12/2021   lisinopril (ZESTRIL) 20 MG tablet Take by mouth.   11/12/2021   Multiple Vitamin (MULTIVITAMIN WITH MINERALS) TABS tablet Take 1 tablet by mouth daily. 180 tablet 0 Past Month   PATADAY  0.2 % SOLN Apply 1 drop to eye daily.   Past Week   SYMBICORT 160-4.5 MCG/ACT inhaler INHALE 2 PUFFS TWICE DAILY FOR ASTHMA   11/12/2021   amoxicillin-clavulanate (AUGMENTIN) 875-125 MG tablet Take 1 tablet by mouth every 12 (twelve) hours. (Patient not taking: Reported on 01/13/2021) 8 tablet 0    Dulaglutide (TRULICITY) 0.75 MG/0.5ML SOPN    11/07/2021   erythromycin ophthalmic ointment Place into the right eye every 6 (six) hours. (Patient not taking: Reported on 01/13/2021) 3.5 g 0    glipiZIDE (GLUCOTROL XL) 10 MG 24 hr tablet Take 10 mg by mouth daily. (Patient not taking: Reported on 01/13/2021)      Ipratropium-Albuterol (COMBIVENT RESPIMAT) 20-100 MCG/ACT AERS respimat Inhale 1 puff into the lungs every 6 (six) hours. (Patient not taking: Reported on 01/13/2021) 1 Inhaler 1    lactobacillus (FLORANEX/LACTINEX) PACK Take 1 packet (1 g total) by mouth 3 (three) times daily with meals. (Patient not taking: Reported on 01/13/2021) 20 packet 0    loperamide (IMODIUM) 2 MG capsule Take 1 capsule (2 mg total) by mouth as needed for diarrhea or loose stools. (Patient not taking: Reported on 01/13/2021) 10 capsule 0    metFORMIN (GLUCOPHAGE) 1000 MG tablet Take 1,000 mg by mouth 2 (two) times daily with a meal. (  Patient not taking: Reported on 11/12/2021)   Not Taking   oseltamivir (TAMIFLU) 30 MG capsule Take 1 capsule (30 mg total) by mouth 2 (two) times daily. (Patient not taking: Reported on 01/13/2021) 6 capsule 0    oxyCODONE-acetaminophen (PERCOCET/ROXICET) 5-325 MG tablet Take 1 tablet by mouth every 4 (four) hours as needed for severe pain. (Patient not taking: Reported on 01/13/2021) 15 tablet 0    predniSONE (DELTASONE) 50 MG tablet Take 0.5 tablets (25 mg total) by mouth daily with breakfast. (Patient not taking: Reported on 01/13/2021) 7 tablet 0    protein supplement shake (PREMIER PROTEIN) LIQD Take 325 mLs (11 oz total) by mouth 2 (two) times daily between meals. (Patient not taking: Reported on  01/13/2021) 90 Can 0    rosuvastatin (CRESTOR) 10 MG tablet Take 10 mg by mouth daily. (Patient not taking: Reported on 11/12/2021)   Not Taking   TRADJENTA 5 MG TABS tablet Take 5 mg by mouth daily. (Patient not taking: Reported on 01/13/2021)       Current medications: Current Facility-Administered Medications  Medication Dose Route Frequency Provider Last Rate Last Admin   acetaminophen (TYLENOL) tablet 650 mg  650 mg Oral Q6H PRN Lorretta HarpNiu, Xilin, MD   650 mg at 11/15/21 1710   alum & mag hydroxide-simeth (MAALOX/MYLANTA) 200-200-20 MG/5ML suspension 30 mL  30 mL Oral Q4H PRN Leeroy BockAnderson, Chelsey L, MD   30 mL at 11/15/21 1953   amLODipine (NORVASC) tablet 10 mg  10 mg Oral Daily Leeroy BockAnderson, Chelsey L, MD   10 mg at 11/16/21 0900   aspirin EC tablet 81 mg  81 mg Oral Daily Lorretta HarpNiu, Xilin, MD   81 mg at 11/16/21 0900   enoxaparin (LOVENOX) injection 40 mg  40 mg Subcutaneous Q24H Lorretta HarpNiu, Xilin, MD   40 mg at 11/16/21 0859   fluticasone (FLOVENT HFA) 110 MCG/ACT inhaler 2 puff  2 puff Inhalation BID Lorretta HarpNiu, Xilin, MD   2 puff at 11/13/21 2041   guaiFENesin (MUCINEX) 12 hr tablet 600 mg  600 mg Oral BID Charise KillianWilliams, Jamiese M, MD   600 mg at 11/16/21 0900   insulin aspart (novoLOG) injection 0-9 Units  0-9 Units Subcutaneous TID WC Lorretta HarpNiu, Xilin, MD   5 Units at 11/16/21 1306   insulin aspart (novoLOG) injection 3 Units  3 Units Subcutaneous TID WC Charise KillianWilliams, Jamiese M, MD   3 Units at 11/15/21 1708   insulin glargine-yfgn (SEMGLEE) injection 20 Units  20 Units Subcutaneous Daily Leeroy BockAnderson, Chelsey L, MD   20 Units at 11/16/21 1037   ipratropium (ATROVENT) nebulizer solution 0.5 mg  2.5 mL Inhalation Q6H PRN Lorretta HarpNiu, Xilin, MD       lisinopril (ZESTRIL) tablet 20 mg  20 mg Oral Daily Fabienne BrunsWilliams, Jamiese M, MD   20 mg at 11/16/21 0900   MEDLINE mouth rinse  15 mL Mouth Rinse BID Leeroy BockAnderson, Chelsey L, MD   15 mL at 11/16/21 1042   multivitamin with minerals tablet 1 tablet  1 tablet Oral Daily Lorretta HarpNiu, Xilin, MD   1 tablet at 11/15/21  [id number]   nirmatrelvir/ritonavir EUA (renal dosing) (PAXLOVID) 2 tablet  2 tablet Oral BID Lorretta HarpNiu, Xilin, MD   2 tablet at 11/16/21 1039   nystatin (MYCOSTATIN) 100000 UNIT/ML suspension 500,000 Units  5 mL Oral QID Manuela SchwartzMorrison, Brenda, NP   500,000 Units at 11/16/21 1307   ondansetron (ZOFRAN) injection 4 mg  4 mg Intravenous Q8H PRN Leeroy BockAnderson, Chelsey L, MD   4 mg at 11/16/21 1156  pantoprazole (PROTONIX) EC tablet 40 mg  40 mg Oral Daily Manuela Schwartz, NP   40 mg at 11/16/21 0900   polyethylene glycol (MIRALAX / GLYCOLAX) packet 17 g  17 g Oral BID Leeroy Bock, MD   17 g at 11/16/21 0859   predniSONE (DELTASONE) tablet 40 mg  40 mg Oral Q breakfast Charise Killian, MD   40 mg at 11/16/21 0900   senna (SENOKOT) tablet 8.6 mg  1 tablet Oral Daily Jamelle Rushing L, MD   8.6 mg at 11/16/21 0900   sorbitol, milk of mag, mineral oil, glycerin (SMOG) enema  960 mL Rectal Once PRN Leeroy Bock, MD          Allergies: Allergies  Allergen Reactions   Albuterol Anaphylaxis    Other reaction(s): Other (See Comments) Patient states it gives her an asthma attack   Codeine Anxiety   Sulfa Antibiotics Rash      Past Medical History: Past Medical History:  Diagnosis Date   Arthritis    Asthma    COPD (chronic obstructive pulmonary disease) (HCC)    Diabetes mellitus without complication (HCC)    Hypertension    Liver disease    Umbilical hernia      Past Surgical History: Past Surgical History:  Procedure Laterality Date   ABDOMINAL HYSTERECTOMY     BLADDER SURGERY     CHOLECYSTECTOMY     nose surgery       Family History: Family History  Problem Relation Age of Onset   Multiple sclerosis Mother    Hypertension Father    COPD Father    Varicose Veins Maternal Aunt    Stroke Nephew      Social History: Social History   Socioeconomic History   Marital status: Widowed    Spouse name: Not on file   Number of children: Not on file   Years of education: Not  on file   Highest education level: Not on file  Occupational History   Not on file  Tobacco Use   Smoking status: Never   Smokeless tobacco: Never  Substance and Sexual Activity   Alcohol use: No   Drug use: No   Sexual activity: Not on file  Other Topics Concern   Not on file  Social History Narrative   Not on file   Social Determinants of Health   Financial Resource Strain: Not on file  Food Insecurity: Not on file  Transportation Needs: Not on file  Physical Activity: Not on file  Stress: Not on file  Social Connections: Not on file  Intimate Partner Violence: Not on file     Review of Systems: Review of Systems  Constitutional:  Positive for malaise/fatigue. Negative for chills and fever.  HENT:  Negative for congestion, sore throat and tinnitus.   Eyes:  Negative for blurred vision and redness.  Respiratory:  Negative for cough, shortness of breath and wheezing.   Cardiovascular:  Negative for chest pain, palpitations, claudication and leg swelling.  Gastrointestinal:  Positive for diarrhea, nausea and vomiting. Negative for abdominal pain and blood in stool.  Genitourinary:  Negative for flank pain, frequency and hematuria.  Musculoskeletal:  Positive for falls. Negative for back pain and myalgias.  Skin:  Negative for rash.  Neurological:  Positive for weakness. Negative for dizziness and headaches.  Endo/Heme/Allergies:  Does not bruise/bleed easily.  Psychiatric/Behavioral:  Negative for depression. The patient is not nervous/anxious and does not have insomnia.    Vital Signs:  Blood pressure (!) 160/76, pulse 89, temperature 98.2 F (36.8 C), resp. rate 18, height 5\' 4"  (1.626 m), weight 77.1 kg, SpO2 100 %.  Weight trends: Filed Weights   11/12/21 0554  Weight: 77.1 kg    Physical Exam: General: Ill-appearing  Head: Normocephalic, atraumatic. Moist oral mucosal membranes  Eyes: Anicteric  Lungs:  Clear to auscultation, normal effort  Heart: Regular  rate and rhythm  Abdomen:  Soft, nontender  Extremities:  1+ peripheral edema.  Neurologic: Nonfocal, moving all four extremities  Skin: No lesions        Lab results: Basic Metabolic Panel: Recent Labs  Lab 11/12/21 0819 11/12/21 0856 11/12/21 1643 11/13/21 0433 11/14/21 0530 11/14/21 1512 11/15/21 0614 11/16/21 0527  NA  --  121*   < > 129* 126* 127* 128* 126*  K  --  2.5*   < > 3.5 4.3  --  4.6 5.1  CL  --  80*   < > 94* 93*  --  96* 95*  CO2  --  28   < > 25 25  --  25 23  GLUCOSE  --  129*   < > 199* 228*  --  225* 262*  BUN  --  9   < > 8 11  --  14 21  CREATININE  --  0.75   < > 0.71 0.73  --  0.83 1.03*  CALCIUM  --  8.5*   < > 8.0* 8.1*  --  8.4* 8.9  MG 1.2*  --   --  1.8  --   --   --   --   PHOS  --  2.2*  --   --   --   --   --   --    < > = values in this interval not displayed.    Liver Function Tests: Recent Labs  Lab 11/12/21 0558  AST 66*  ALT 34  ALKPHOS 81  BILITOT 0.8  PROT 6.3*  ALBUMIN 3.0*   Recent Labs  Lab 11/12/21 0558  LIPASE 43   No results for input(s): AMMONIA in the last 168 hours.  CBC: Recent Labs  Lab 11/12/21 0558 11/13/21 0433 11/14/21 0530 11/15/21 0614  WBC 3.9* 1.4* 7.1 10.9*  NEUTROABS  --   --  6.3 10.0*  HGB 13.4 13.3 13.0 13.4  HCT 38.4 38.2 38.2 39.2  MCV 85.5 85.7 87.4 87.5  PLT 145* 99* 141* 192    Cardiac Enzymes: No results for input(s): CKTOTAL, CKMB, CKMBINDEX, TROPONINI in the last 168 hours.  BNP: Invalid input(s): POCBNP  CBG: Recent Labs  Lab 11/15/21 1207 11/15/21 1526 11/15/21 2102 11/16/21 0758 11/16/21 1209  GLUCAP 278* 316* 302* 241* 256*    Microbiology: Results for orders placed or performed during the hospital encounter of 11/12/21  Resp Panel by RT-PCR (Flu A&B, Covid) Nasopharyngeal Swab     Status: Abnormal   Collection Time: 11/12/21  8:19 AM   Specimen: Nasopharyngeal Swab; Nasopharyngeal(NP) swabs in vial transport medium  Result Value Ref Range Status   SARS  Coronavirus 2 by RT PCR POSITIVE (A) NEGATIVE Final    Comment: (NOTE) SARS-CoV-2 target nucleic acids are DETECTED.  The SARS-CoV-2 RNA is generally detectable in upper respiratory specimens during the acute phase of infection. Positive results are indicative of the presence of the identified virus, but do not rule out bacterial infection or co-infection with other pathogens not detected by the test. Clinical correlation with patient history  and other diagnostic information is necessary to determine patient infection status. The expected result is Negative.  Fact Sheet for Patients: BloggerCourse.com  Fact Sheet for Healthcare Providers: SeriousBroker.it  This test is not yet approved or cleared by the [place] FDA and  has been authorized for detection and/or diagnosis of SARS-CoV-2 by FDA under an Emergency Use Authorization (EUA).  This EUA will remain in effect (meaning this test can be used) for the duration of  the COVID-19 declaration under Section 564(b)(1) of the A ct, 21 U.S.C. section 360bbb-3(b)(1), unless the authorization is terminated or revoked sooner.     Influenza A by PCR NEGATIVE NEGATIVE Final   Influenza B by PCR NEGATIVE NEGATIVE Final    Comment: (NOTE) The Xpert Xpress SARS-CoV-2/FLU/RSV plus assay is intended as an aid in the diagnosis of influenza from Nasopharyngeal swab specimens and should not be used as a sole basis for treatment. Nasal washings and aspirates are unacceptable for Xpert Xpress SARS-CoV-2/FLU/RSV testing.  Fact Sheet for Patients: BloggerCourse.com  Fact Sheet for Healthcare Providers: SeriousBroker.it  This test is not yet approved or cleared by the [place] FDA and has been authorized for detection and/or diagnosis of SARS-CoV-2 by FDA under an Emergency Use Authorization (EUA). This EUA will remain in effect (meaning  this test can be used) for the duration of the COVID-19 declaration under Section 564(b)(1) of the Act, 21 U.S.C. section 360bbb-3(b)(1), unless the authorization is terminated or revoked.  Performed at [facility], [address] [place], [place] [address]   Blood culture (single)     Status: None (Preliminary result)   Collection Time: 11/12/21  8:19 AM   Specimen: BLOOD  Result Value Ref Range Status   Specimen Description BLOOD RIGHT ANTECUBITAL  Final   Special Requests   Final    BOTTLES DRAWN AEROBIC AND ANAEROBIC Blood Culture results may not be optimal due to an excessive volume of blood received in culture bottles   Culture   Final    NO GROWTH 4 DAYS Performed at [facility], [address] [place], [place] [address]    Report Status PENDING  Incomplete    Coagulation Studies: No results for input(s): LABPROT, INR in the last 72 hours.  Urinalysis: No results for input(s): COLORURINE, LABSPEC, PHURINE, GLUCOSEU, HGBUR, BILIRUBINUR, KETONESUR, PROTEINUR, UROBILINOGEN, NITRITE, LEUKOCYTESUR in the last 72 hours.  Invalid input(s): APPERANCEUR    Imaging: DG Abd 1 View  Result Date: 11/15/2021 CLINICAL DATA:  Nausea vomiting. EXAM: ABDOMEN - 1 VIEW COMPARISON:  CT abdomen pelvis dated 05/03/2015. FINDINGS: No bowel dilatation or evidence of obstruction. No free air. Right upper quadrant cholecystectomy clips. The soft tissues are unremarkable. Degenerative changes of the spine. IMPRESSION: Negative. Electronically Signed   By: Elgie Collard M.D.   On: 11/15/2021 21:36   US Venous Img Upper Uni Right(DVT)  Result Date: 11/16/2021 CLINICAL DATA:  Pain and swelling EXAM: Right UPPER EXTREMITY VENOUS DOPPLER ULTRASOUND TECHNIQUE: Gray-scale sonography with graded compression, as well as color Doppler and duplex ultrasound were performed to evaluate the upper extremity deep venous system from the level of the subclavian vein and including the jugular,  axillary, basilic, radial, ulnar and upper cephalic vein. Spectral Doppler was utilized to evaluate flow at rest and with distal augmentation maneuvers. COMPARISON:  None. FINDINGS: Contralateral Subclavian Vein: Respiratory phasicity is normal and symmetric with the symptomatic side. No evidence of thrombus. Normal compressibility. Internal Jugular Vein: No evidence of thrombus. Normal compressibility, respiratory phasicity  and response to augmentation. Subclavian Vein: No evidence of thrombus. Normal compressibility, respiratory phasicity and response to augmentation. Axillary Vein: No evidence of thrombus. Normal compressibility, respiratory phasicity and response to augmentation. Cephalic Vein: No evidence of thrombus. Normal compressibility, respiratory phasicity and response to augmentation. Basilic Vein: No evidence of thrombus. Normal compressibility, respiratory phasicity and response to augmentation. Brachial Veins: No evidence of thrombus. Normal compressibility, respiratory phasicity and response to augmentation. Radial Veins: No evidence of thrombus. Normal compressibility, respiratory phasicity and response to augmentation. Ulnar Veins: No evidence of thrombus. Normal compressibility, respiratory phasicity and response to augmentation. Venous Reflux:  None visualized. Other Findings: There is edema in subcutaneous plane in the right forearm without loculated fluid collections. IMPRESSION: No evidence of DVT within the right upper extremity. Electronically Signed   By: Ernie Avena M.D.   On: 11/16/2021 12:14     Assessment & Plan: Ms. EMILINA SWANIGAN is a 81 y.o.  female with medical conditions including hypertension, diabetes, hyperlipidemia, asthma, superficial thrombophlebitis, who was admitted to [facility] on 11/12/2021 for Acute bronchitis [J20.9] Hypokalemia [E87.6] Hyponatremia [E87.1] Bronchitis [J40] COPD exacerbation (HCC) [J44.1]  Hyponatremia with baseline 129-131.  Treated with  fluid restriction and IV fluids with initial improvement to 129.  Current level 126.  Generalized edema.  Will order albumin 12.5 g twice daily along with low-dose furosemide 10 mg twice daily.  We will monitor sodium level slightly.  2.  Hypertension.  Currently receiving amlodipine and lisinopril.  BP currently 157/67.  3.  Type 2 diabetes mellitus.  Noninsulin-dependent.  Home medications include glipizide and Tradjenta.  Primary team to manage SSI     LOS: 4 Sharyon Peitz 3/2/20235:19 PM

## 2021-11-16 NOTE — Progress Notes (Signed)
?   11/15/21 1932  ?Assess: MEWS Score  ?Temp 98.1 ?F (36.7 ?C)  ?BP (!) 160/71  ?Pulse Rate (!) 108  ?Resp (!) 22  ?Level of Consciousness Alert  ?SpO2 99 %  ?O2 Device Nasal Cannula  ?O2 Flow Rate (L/min) 3 L/min  ?Assess: MEWS Score  ?MEWS Temp 0  ?MEWS Systolic 0  ?MEWS Pulse 1  ?MEWS RR 1  ?MEWS LOC 0  ?MEWS Score 2  ?MEWS Score Color Yellow  ?Assess: if the MEWS score is Yellow or Red  ?Were vital signs taken at a resting state? No  ?Focused Assessment Change from prior assessment (see assessment flowsheet)  ?Treat  ?MEWS Interventions Administered prn meds/treatments  ?Pain Scale 0-10  ?Pain Score 6  ?Pain Type Acute pain  ?Pain Location Abdomen  ?Pain Orientation Right;Left;Mid  ?Pain Radiating Towards Chest  ?Pain Descriptors / Indicators Other (Comment) ?(wants to throw up but cant)  ?Pain Frequency Intermittent  ?Pain Onset Unable to tell  ?Patients Stated Pain Goal 0  ?Pain Intervention(s) Medication (See eMAR) ?(maalox)  ?Multiple Pain Sites Yes  ?Complains of Constipation  ?2nd Pain Site  ?Pain Score 6  ?Pain Type Acute pain  ?Pain Location Chest  ?Pain Orientation Left;Mid  ?Pain Descriptors / Indicators Heaviness  ?Pain Frequency Intermittent  ?Pain Onset Unable to tell  ?Patient's Stated Pain Goal 0  ?Pain Intervention(s) Medication (See eMAR);MD notified (Comment)  ?Take Vital Signs  ?Increase Vital Sign Frequency  Yellow: Q 2hr X 2 then Q 4hr X 2, if remains yellow, continue Q 4hrs  ?Escalate  ?MEWS: Escalate Yellow: discuss with charge nurse/RN and consider discussing with provider and RRT  ?Notify: Charge Nurse/RN  ?Name of Charge Nurse/RN Notified [place], RN  ?Date Charge Nurse/RN Notified 11/15/21  ?Time Charge Nurse/RN Notified 1930  ?Notify: Provider  ?Provider Name/Title B. Morrisson, NP  ?Date Provider Notified 11/15/21  ?Time Provider Notified 1940  ?Notification Type Page  ?Notification Reason Change in status  ?Provider response At bedside;See new orders  ?Date of Provider Response  11/15/21  ?Time of Provider Response 2000  ?Notify: Rapid Response  ?Name of Rapid Response RN Notified Lu Duffel, RN  ?Date Rapid Response Notified 11/15/21  ?Time Rapid Response Notified 1940  ?Document  ?Patient Outcome Other (Comment)  ?Assess: SIRS CRITERIA  ?SIRS Temperature  0  ?SIRS Pulse 1  ?SIRS Respirations  1  ?SIRS WBC 0  ?SIRS Score Sum  2  ? ?Rapid response called d/t change of condition. Upon assessment, pt face and chest area is red. Received report that pt left arm is swollen and U/S is already ordered. At bedside, pt right arm is also with 2+ pitting edema. Pt reports blisters in her mouth, and pain in chest area. Charge RN notified and agreeable regarding calling rapid response. EKG completed. BP elevated d/t abdominal pain radiating to her chest. Received new order for thrush, PPI for stomach pain, and stool softener for constipation. Increased observation and VS check d/t yellow [place]. ? ?

## 2021-11-16 NOTE — Care Management Important Message (Signed)
Important Message ? ?Patient Details  ?Name: Kendra Clarke ?MRN: [id number] ?Date of Birth: 13-Feb-1941 ? ? ?Medicare Important Message Given:  Yes ? ?Patient is in an isolation room and I reviewed her Important Message from Medicare with her by phone [phone]). She is agreement with her discharge plan. I wished her a speedy recovery and thanked her for her time. ? ?Olegario Messier A Jezreel Sisk ?11/16/2021, 10:25 AM ?

## 2021-11-16 NOTE — Progress Notes (Signed)
?PROGRESS NOTE ? ?Kendra Clarke    DOB: [birth year], 81 y.o.  ?GBE:[id number]  ?  Code Status: Full Code   ?DOA: 11/12/2021   LOS: 4  ? ?Brief hospital course  ?Kendra Clarke is a 81 y.o. female with a PMH significant for HTN, HLD, type II DM, COPD, asthma, varicose veins, ventral hernia, superficial thrombophlebitis. ?They presented from home to the ED on 11/12/2021 with cough, SOB, N/V/D x 7 days.  ?In the ED, it was found that they had COVID+, hyponatremia, bronchitis changes on chest xray. Had negative head CT  ?They were treated with paxlovid, steroids, IV fluids, and supportive care.  ?Patient was admitted to medicine service for further workup and management of acute respiratory disorder without hypoxia as outlined in detail below. ? ?11/16/21 -stable, feeling unwell with epigastric pain, constipation and R arm pain ? ?Assessment & Plan  ?Principal Problem: ?  Acute respiratory disease due to COVID-19 virus ?Active Problems: ?  Sepsis (HCC) ?  Bronchitis ?  HTN (hypertension) ?  HLD (hyperlipidemia) ?  COPD exacerbation (HCC) ?  Diabetes mellitus without complication (HCC) ?  Fall at home, initial encounter ?  Hyponatremia ?  Hypokalemia ?  Disorder of electrolytes ?  Elevated troponin ?  Nausea & vomiting ?  Hypomagnesemia ? ?Acute respiratory disease and sepsis due to COVID-19 virus- remains stable on 2-3L [place] for comfort. ?- continue steroids, paxlovid ?- supportive care PRN ?- wean to room air ?- pulse ox with ambulation ? ?Asymmetrical R arm swelling- appears stable to me today.  ?- analgesia PRN ?- RUE duplex to r/o clot. H/o phlebitis. No IV fluids running in this arm ? ?Epigastric pain- event overnight. KUB revealed no obstruction. Patient endorses constipation.  ?- bowel regimen ?  ?COPD exacerbation (HCC): trigged by covid infection ?-Bronchodilators and Solu-Medrol as above ?  ?Fall at home, initial encounter: CT head negative. ?-PT/OT ?  ?HTN (hypertension) ?-continue home Amlodipine ?-Hold HCTZ  and lisinopril due to hyponatremia ?  ?HLD (hyperlipidemia) ?-Hold Crestor due to Paxlovid use ?  ?Diabetes mellitus without complication [facility]): Patient is taking Trulicity at home ?-Sliding scale insulin ?  ?Hyponatremia  AKI- Sodium 121>>128>126. Typically COVID associated hyponatremia corrects rapidly but this is prolonged course and technically has AKI with baseline Cr around 0.7 and is 1.03 today. ?- nephrology consult ?- fluid restriction ?-Hold lisinopril and HCTZ ?-BMP am.  ?  ?Disorder of electrolytes including hypokalemia, hypomagnesemia, hyponatremia ?-Repleted potassium and magnesium ?  ?Elevated troponin: Troponin level 21, no chest pain, likely demand ischemia ?-continue Aspirin ?  ?Nausea & vomiting and diarrhea: Most likely due to COVID infection.  No fever or leukocytosis.  Low suspicions for C. Difficile. Improved. Able to tolerate increased diet ?-As needed Imodium ? ?Body mass index is 29.18 kg/m?. ? ?VTE ppx: enoxaparin (LOVENOX) injection 40 mg Start: 11/12/21 1000 ? ? ?Diet:  ?   ?Diet  ? Diet Carb Modified Fluid consistency: Thin; Room service appropriate? Yes  ? ?Subjective 11/16/21   ? ?Pt reports not feeling well. Continues to have epigastric pain and right arm pain.  ?  ?Objective  ? ?Vitals:  ? 11/15/21 2332 11/16/21 0146 11/16/21 0330 11/16/21 7588  ?BP: (!) 147/69  (!) 146/70 (!) 146/63  ?Pulse: 87  87 92  ?Resp: 16 17 20 17   ?Temp: 97.9 ?F (36.6 ?C)  97.8 ?F (36.6 ?C) 98 ?F (36.7 ?C)  ?TempSrc: Oral  Oral Oral  ?SpO2: 100%  100% 99%  ?Weight:      ?  Height:      ? ?No intake or output data in the 24 hours ending 11/16/21 0733 ? ?Filed Weights  ? 11/12/21 0554  ?Weight: 77.1 kg  ?  ? ?Physical Exam:  ?General: awake, alert, NAD. Ill-appearing ?HEENT: atraumatic, clear conjunctiva, anicteric sclera, MMM, hearing grossly normal ?Respiratory: normal respiratory effort. CTAB ?Cardiovascular: normal S1/S2, RRR, no JVD, murmurs, quick capillary refill  ?Gastrointestinal: soft, tender to  epigastric area ?Nervous: A&O x3. no gross focal neurologic deficits, normal speech ?Extremities: moves all equally, generalized edema, normal tone ?Skin: dry, intact, normal temperature, normal color. No rashes, lesions or ulcers on exposed skin ?Psychiatry: normal mood, congruent affect ? ?Labs   ?I have personally reviewed the following labs and imaging studies ?CBC ?   ?Component Value Date/Time  ? WBC 10.9 (H) 11/15/2021 7544  ? RBC 4.48 11/15/2021 0614  ? HGB 13.4 11/15/2021 0614  ? HGB 13.1 10/11/2013 0441  ? HCT 39.2 11/15/2021 0614  ? HCT 39.9 10/11/2013 0441  ? PLT 192 11/15/2021 0614  ? PLT 247 10/11/2013 0441  ? MCV 87.5 11/15/2021 0614  ? MCV 92 10/11/2013 0441  ? MCH 29.9 11/15/2021 0614  ? MCHC 34.2 11/15/2021 0614  ? RDW 12.9 11/15/2021 0614  ? RDW 14.1 10/11/2013 0441  ? LYMPHSABS 0.4 (L) 11/15/2021 9201  ? LYMPHSABS 1.0 10/11/2013 0441  ? MONOABS 0.4 11/15/2021 0614  ? MONOABS 0.3 10/11/2013 0441  ? EOSABS 0.0 11/15/2021 0614  ? EOSABS 0.0 10/11/2013 0441  ? BASOSABS 0.0 11/15/2021 0071  ? BASOSABS 0.0 10/11/2013 0441  ? ?BMP Latest Ref Rng & Units 11/16/2021 11/15/2021 11/14/2021  ?Glucose 70 - 99 mg/dL 219(X) 588(T) -  ?BUN 8 - 23 mg/dL 21 14 -  ?Creatinine 0.44 - 1.00 mg/dL 2.54(D) 8.26 -  ?Sodium 135 - 145 mmol/L 126(L) 128(L) 127(L)  ?Potassium 3.5 - 5.1 mmol/L 5.1 4.6 -  ?Chloride 98 - 111 mmol/L 95(L) 96(L) -  ?CO2 22 - 32 mmol/L 23 25 -  ?Calcium 8.9 - 10.3 mg/dL 8.9 4.1(R) -  ? ? ?DG Abd 1 View ? ?Result Date: 11/15/2021 ?CLINICAL DATA:  Nausea vomiting. EXAM: ABDOMEN - 1 VIEW COMPARISON:  CT abdomen pelvis dated 05/03/2015. FINDINGS: No bowel dilatation or evidence of obstruction. No free air. Right upper quadrant cholecystectomy clips. The soft tissues are unremarkable. Degenerative changes of the spine. IMPRESSION: Negative. Electronically Signed   By: Elgie Collard M.D.   On: 11/15/2021 21:36   ? ?Disposition Plan & Communication  ?Patient status: Inpatient  ?Admitted From: Home ?Planned  disposition location: Home health recommended (patient refusing home health) ?Anticipated discharge date: 3/4 pending improvement in Na+, r/u UE VTE with [place], oxygen wean ? ?Family Communication: none  ?  ?Author: ?Leeroy Bock, DO ?[facility] ?11/16/2021, 7:33 AM  ? ?Available by Epic secure chat 7AM-7PM. ?If 7PM-7AM, please contact night-coverage.  ?[facility] contact information found on [email or web address]. ? ?

## 2021-11-16 NOTE — Progress Notes (Signed)
Physical Therapy Treatment ?Patient Details ?Name: Kendra Clarke ?MRN: [id number] ?DOB: 1941/09/05 ?Today's Date: 11/16/2021 ? ? ?History of Present Illness Pt is an 81 y.o. female presenting to hospital 2/26 with c/o SOB, cough, fatigue, increasing weakness, nausea, difficulty tolerating p.o., diarrhea, and significant lightheadedness with standing causing her to fall several times.  Pt admitted with acute respiratory disease and sepsis d/t COVID-19 virus, COPD exacerbation, leukopenia, and thrombocyptopenia.  PMH includes sepsis, DM, htn, HLD, DM, COPD, large ventral hernia, and superficial thrombophlebitis. ? ?  ?PT Comments  ? ? Pt was in the middle of getting OOB to chair upon arriving. She stated she wanted to be in recliner for breakfast. Author encouraged use of call bell in future. Pt has [place] ordered for BUEs to rule out DVT. Elected not to progress ambulation but just to assist pt to chair as requested. Pt was easily able to go from bed to recliner but is much more fatigued afterwards. Will leave recs for HHPT at DC however may need to be be downgraded if pt continued to regress.  ?    ?Recommendations for follow up therapy are one component of a multi-disciplinary discharge planning process, led by the attending physician.  Recommendations may be updated based on patient status, additional functional criteria and insurance authorization. ? ?Follow Up Recommendations ? Home health PT ?  ?  ?Assistance Recommended at Discharge PRN  ?Patient can return home with the following A little help with walking and/or transfers;Assistance with cooking/housework;Assist for transportation;Help with stairs or ramp for entrance ?  ?Equipment Recommendations ? None recommended by PT  ?  ?   ?Precautions / Restrictions Precautions ?Precautions: Fall ?Restrictions ?Weight Bearing Restrictions: No  ?  ? ?Mobility ? Bed Mobility ?Overal bed mobility: Needs Assistance ?Bed Mobility: Sit to Supine, Supine to Sit ?  ?  ?Supine to  sit: Supervision ?Sit to supine: Supervision ?  ?General bed mobility comments: pt was progressing to EOB upon walking into room. ?  ? ?Transfers ?Overall transfer level: Needs assistance ?Equipment used: None ?Transfers: Sit to/from Stand ?Sit to Stand: Supervision ?  ?  ?General transfer comment: pt was able to stand from lowest bed height with supervision however required more time to get OOB today. Elected not to ambulate far distances due to being ruled out for DVT. ?  ? ?Ambulation/Gait ?Ambulation/Gait assistance: Supervision ?Gait Distance (Feet): 3 Feet ?Assistive device: Rolling walker (2 wheels) ?Gait Pattern/deviations: Step-through pattern ?Gait velocity: decreased ?  ?  ?General Gait Details: pt was able to take steps from EOB to recliner. pt is much more fatigued with alot less activity. ? ?  ?Balance Overall balance assessment: Needs assistance ?Sitting-balance support: No upper extremity supported, Feet supported ?Sitting balance-Leahy Scale: Good ?  ?  ?Standing balance support: Bilateral upper extremity supported, During functional activity ?Standing balance-Leahy Scale: Good ?  ?  ?  ?  ?Cognition Arousal/Alertness: Awake/alert ?Behavior During Therapy: [facility] for tasks assessed/performed ?Overall Cognitive Status: Within Functional Limits for tasks assessed ?  ?   ?General Comments: Pt is A and O x 4 ?  ?  ? ?  ?   ?   ? ?Pertinent Vitals/Pain Pain Assessment ?Pain Assessment: No/denies pain  ? ? ? ?PT Goals (current goals can now be found in the care plan section) Acute Rehab PT Goals ?Patient Stated Goal: get better so I can get home ?Progress towards PT goals: Progressing toward goals ? ?  ?Frequency ? ? ? Min 2X/week ? ? ? ?  ?  PT Plan Current plan remains appropriate  ? ? ?   ?AM-PAC PT "6 Clicks" Mobility   ?Outcome Measure ? Help needed turning from your back to your side while in a flat bed without using bedrails?: None ?Help needed moving from lying on your back to sitting on the side of a  flat bed without using bedrails?: A Little ?Help needed moving to and from a bed to a chair (including a wheelchair)?: A Little ?Help needed standing up from a chair using your arms (e.g., wheelchair or bedside chair)?: A Little ?Help needed to walk in hospital room?: A Little ?Help needed climbing 3-5 steps with a railing? : A Little ?6 Click Score: 19 ? ?  ?End of Session Equipment Utilized During Treatment: Gait belt;Oxygen ?Activity Tolerance: Patient limited by fatigue ?Patient left: in chair;with call bell/phone within reach;with chair alarm set ?Nurse Communication: Mobility status;Precautions ?PT Visit Diagnosis: Unsteadiness on feet (R26.81);Muscle weakness (generalized) (M62.81);History of falling (Z91.81) ?  ? ? ?Time: [id number] ?PT Time Calculation (min) (ACUTE ONLY): 15 min ? ?Charges:  $Therapeutic Activity: 8-22 mins          ?          ?Julaine Fusi PTA ?11/16/21, 1:28 PM  ? ?

## 2021-11-17 DIAGNOSIS — U071 COVID-19: Secondary | ICD-10-CM | POA: Diagnosis not present

## 2021-11-17 DIAGNOSIS — J441 Chronic obstructive pulmonary disease with (acute) exacerbation: Secondary | ICD-10-CM | POA: Diagnosis not present

## 2021-11-17 DIAGNOSIS — R1013 Epigastric pain: Secondary | ICD-10-CM | POA: Diagnosis not present

## 2021-11-17 DIAGNOSIS — E43 Unspecified severe protein-calorie malnutrition: Secondary | ICD-10-CM

## 2021-11-17 DIAGNOSIS — R609 Edema, unspecified: Secondary | ICD-10-CM

## 2021-11-17 DIAGNOSIS — E876 Hypokalemia: Secondary | ICD-10-CM | POA: Diagnosis not present

## 2021-11-17 LAB — COMPREHENSIVE METABOLIC PANEL
ALT: 40 U/L (ref 0–44)
AST: 44 U/L — ABNORMAL HIGH (ref 15–41)
Albumin: 2.8 g/dL — ABNORMAL LOW (ref 3.5–5.0)
Alkaline Phosphatase: 66 U/L (ref 38–126)
Anion gap: 5 (ref 5–15)
BUN: 24 mg/dL — ABNORMAL HIGH (ref 8–23)
CO2: 26 mmol/L (ref 22–32)
Calcium: 8.8 mg/dL — ABNORMAL LOW (ref 8.9–10.3)
Chloride: 96 mmol/L — ABNORMAL LOW (ref 98–111)
Creatinine, Ser: 1.07 mg/dL — ABNORMAL HIGH (ref 0.44–1.00)
GFR, Estimated: 53 mL/min — ABNORMAL LOW (ref >60)
Glucose, Bld: 204 mg/dL — ABNORMAL HIGH (ref 70–99)
Potassium: 4.8 mmol/L (ref 3.5–5.1)
Sodium: 127 mmol/L — ABNORMAL LOW (ref 135–145)
Total Bilirubin: 1.1 mg/dL (ref 0.3–1.2)
Total Protein: 5.7 g/dL — ABNORMAL LOW (ref 6.5–8.1)

## 2021-11-17 LAB — GLUCOSE, CAPILLARY
Glucose-Capillary: 180 mg/dL — ABNORMAL HIGH (ref 70–99)
Glucose-Capillary: 176 mg/dL — ABNORMAL HIGH (ref 70–99)
Glucose-Capillary: 204 mg/dL — ABNORMAL HIGH (ref 70–99)
Glucose-Capillary: 215 mg/dL — ABNORMAL HIGH (ref 70–99)

## 2021-11-17 LAB — TSH: TSH: 0.301 u[IU]/mL — ABNORMAL LOW (ref 0.350–4.500)

## 2021-11-17 LAB — EXPECTORATED SPUTUM ASSESSMENT W GRAM STAIN, RFLX TO RESP C

## 2021-11-17 LAB — T4, FREE: Free T4: 1.66 ng/dL — ABNORMAL HIGH (ref 0.61–1.12)

## 2021-11-17 LAB — CULTURE, BLOOD (SINGLE): Culture: NO GROWTH

## 2021-11-17 MED ORDER — ATENOLOL 25 MG PO TABS
25.0000 mg | ORAL_TABLET | Freq: Every day | ORAL | Status: DC
  Administered 2021-11-17 – 2021-11-21 (×5): 25 mg via ORAL
  Filled 2021-11-17 (×5): qty 1

## 2021-11-17 MED ORDER — PROCHLORPERAZINE EDISYLATE 10 MG/2ML IJ SOLN
10.0000 mg | Freq: Four times a day (QID) | INTRAMUSCULAR | Status: DC | PRN
  Administered 2021-11-17 – 2021-11-19 (×2): 10 mg via INTRAVENOUS
  Filled 2021-11-17 (×3): qty 2

## 2021-11-17 NOTE — Progress Notes (Signed)
PROGRESS NOTE  Kendra Clarke    DOB: 09/26/1940, 81 y.o.  [id number]    Code Status: Full Code   DOA: 11/12/2021   LOS: 5   Brief hospital course  Kendra Clarke is a 81 y.o. female with a PMH significant for HTN, HLD, type II DM, COPD, asthma, varicose veins, ventral hernia, superficial thrombophlebitis. They presented from home to the ED on 11/12/2021 with cough, SOB, N/V/D x 7 days.  In the ED, it was found that they had COVID+, hyponatremia, bronchitis changes on chest xray. Had negative head CT  They were treated with paxlovid, steroids, IV fluids, and supportive care.  Patient was admitted to medicine service for further workup and management of acute respiratory disorder without hypoxia as outlined in detail below.  11/17/21 -stable, feeling unwell with continued epigastric pain, and R arm pain  Assessment & Plan  Principal Problem:   Acute respiratory disease due to COVID-19 virus Active Problems:   Sepsis ([place])   Bronchitis   HTN (hypertension)   HLD (hyperlipidemia)   COPD exacerbation (HCC)   Diabetes mellitus without complication ([place])   Fall at home, initial encounter   Hyponatremia   Hypokalemia   Disorder of electrolytes   Elevated troponin   Nausea & vomiting   Hypomagnesemia   Abdominal pain  Sepsis - sepsis criteria have resolved.  Acute hypoxic respiratory failure 2/2 COVID-19   COPD exacerbation- remains stable on 2-3L [place] for comfort. She does not desaturate when self removed from oxygen so asked nurse to remove and monitor. - continue steroids, paxlovid - supportive care PRN - wean to room air - pulse ox with ambulation - bronchodilators PRN, and scheduled - PT/OT- recommending HHPT/OT  Asymmetrical R arm swelling- appears stable and symmetrical to me today. RUE duplex negative for DVT - analgesia PRN  Epigastric pain   constipation- improved. patient has had 2 Bms in past 24 hours - bowel regimen  Hyperthyroidism? Graves disease?- TSH  mildly decreased. potentially subclinical. 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  - T3, T4 pending - thyroid US when feeling improved - will start betablocker now which her HR/BP can tolerate - consider methimazole        HTN- mildly elevated -continue home Amlodipine -Hold HCTZ and lisinopril due to hyponatremia   Hyponatremia   AKI- Sodium 121>>128>126>127. Typically COVID associated hyponatremia corrects rapidly but this is prolonged course and technically has AKI with baseline Cr around 0.7 and is 1.03>1.07 today. - nephrology consult, appreciate recs  - albumin  - lasix -Hold HCTZ -BMP am. HLD (hyperlipidemia) -Hold Crestor due to Paxlovid use   Diabetes mellitus without complication [facility]): Patient is taking Trulicity at home. Blood sugars elevated with steroid use.  -Sliding scale insulin  Fall at home, initial encounter: CT head negative. -PT/OT   Elevated troponin: Troponin level 21, no chest pain, likely demand ischemia -continue Aspirin   Nausea & vomiting and diarrhea- resolved. Most likely due to COVID infection.  No fever or leukocytosis.  Low suspicions for C. Difficile. Able to tolerate increased diet - supportive care  Body mass index is 29.18 kg/m.  VTE ppx: enoxaparin (LOVENOX) injection 40 mg Start: 11/12/21 1000   Diet:     Diet   Diet Carb Modified Fluid consistency: Thin; Room service appropriate? Yes; Fluid restriction: 1800 mL Fluid   Subjective 11/17/21    Pt reports not feeling well. Continues to have  epigastric pain and right arm pain. She thinks they may be improving but overall feeling crumby.   Objective   Vitals:   11/16/21 0754 11/16/21 1732 11/16/21 2019 11/17/21 0451  BP: (!) 160/76 (!) 157/67 131/64 (!) 122/52  Pulse: 89 96 100 92  Resp: 18 20 20 18   Temp: 98.2 F (36.8 C) 98.5 F (36.9 C) 98.4 F (36.9 C) 97.6 F (36.4 C)  TempSrc:   Oral Oral  SpO2: 100%  100% 99% 100%  Weight:      Height:        Intake/Output Summary (Last 24 hours) at 11/17/2021 0740 Last data filed at 11/16/2021 2230 Gross per 24 hour  Intake 50 ml  Output --  Net 50 ml    Filed Weights   11/12/21 0554  Weight: 77.1 kg     Physical Exam:  General: awake, alert, NAD. Ill-appearing HEENT: atraumatic, clear conjunctiva, anicteric sclera, MMM, hearing grossly normal Respiratory: normal respiratory effort. CTAB Cardiovascular: normal S1/S2, RRR, no JVD, murmurs, quick capillary refill  Gastrointestinal: soft, tender to epigastric area Nervous: A&O x3. no gross focal neurologic deficits, normal speech Extremities: moves all equally, generalized edema, normal tone Skin: dry, intact, normal temperature, normal color. No rashes, lesions or ulcers on exposed skin Psychiatry: normal mood, congruent affect  Labs   I have personally reviewed the following labs and imaging studies CBC    Component Value Date/Time   WBC 10.9 (H) 11/15/2021 0614   RBC 4.48 11/15/2021 0614   HGB 13.4 11/15/2021 0614   HGB 13.1 10/11/2013 0441   HCT 39.2 11/15/2021 0614   HCT 39.9 10/11/2013 0441   PLT 192 11/15/2021 0614   PLT 247 10/11/2013 0441   MCV 87.5 11/15/2021 0614   MCV 92 10/11/2013 0441   MCH 29.9 11/15/2021 0614   MCHC 34.2 11/15/2021 0614   RDW 12.9 11/15/2021 0614   RDW 14.1 10/11/2013 0441   LYMPHSABS 0.4 (L) 11/15/2021 0614   LYMPHSABS 1.0 10/11/2013 0441   MONOABS 0.4 11/15/2021 0614   MONOABS 0.3 10/11/2013 0441   EOSABS 0.0 11/15/2021 0614   EOSABS 0.0 10/11/2013 0441   BASOSABS 0.0 11/15/2021 0614   BASOSABS 0.0 10/11/2013 0441   BMP Latest Ref Rng & Units 11/17/2021 11/16/2021 11/15/2021  Glucose 70 - 99 mg/dL 204(H) 262(H) 225(H)  BUN 8 - 23 mg/dL 24(H) 21 14  Creatinine 0.44 - 1.00 mg/dL 1.07(H) 1.03(H) 0.83  Sodium 135 - 145 mmol/L 127(L) 126(L) 128(L)  Potassium 3.5 - 5.1 mmol/L 4.8 5.1 4.6  Chloride 98 - 111 mmol/L 96(L) 95(L) 96(L)  CO2 22 - 32  mmol/L 26 23 25   Calcium 8.9 - 10.3 mg/dL 8.8(L) 8.9 8.4(L)    DG Abd 1 View  Result Date: 11/15/2021 CLINICAL DATA:  Nausea vomiting. EXAM: ABDOMEN - 1 VIEW COMPARISON:  CT abdomen pelvis dated 05/03/2015. FINDINGS: No bowel dilatation or evidence of obstruction. No free air. Right upper quadrant cholecystectomy clips. The soft tissues are unremarkable. Degenerative changes of the spine. IMPRESSION: Negative. Electronically Signed   By: Anner Crete M.D.   On: 11/15/2021 21:36   US Venous Img Upper Uni Right(DVT)  Result Date: 11/16/2021 CLINICAL DATA:  Pain and swelling EXAM: Right UPPER EXTREMITY VENOUS DOPPLER ULTRASOUND TECHNIQUE: Gray-scale sonography with graded compression, as well as color Doppler and duplex ultrasound were performed to evaluate the upper extremity deep venous system from the level of the subclavian vein and including the jugular, axillary, basilic, radial, ulnar and upper cephalic vein.  Spectral Doppler was utilized to evaluate flow at rest and with distal augmentation maneuvers. COMPARISON:  None. FINDINGS: Contralateral Subclavian Vein: Respiratory phasicity is normal and symmetric with the symptomatic side. No evidence of thrombus. Normal compressibility. Internal Jugular Vein: No evidence of thrombus. Normal compressibility, respiratory phasicity and response to augmentation. Subclavian Vein: No evidence of thrombus. Normal compressibility, respiratory phasicity and response to augmentation. Axillary Vein: No evidence of thrombus. Normal compressibility, respiratory phasicity and response to augmentation. Cephalic Vein: No evidence of thrombus. Normal compressibility, respiratory phasicity and response to augmentation. Basilic Vein: No evidence of thrombus. Normal compressibility, respiratory phasicity and response to augmentation. Brachial Veins: No evidence of thrombus. Normal compressibility, respiratory phasicity and response to augmentation. Radial Veins: No evidence  of thrombus. Normal compressibility, respiratory phasicity and response to augmentation. Ulnar Veins: No evidence of thrombus. Normal compressibility, respiratory phasicity and response to augmentation. Venous Reflux:  None visualized. Other Findings: There is edema in subcutaneous plane in the right forearm without loculated fluid collections. IMPRESSION: No evidence of DVT within the right upper extremity. Electronically Signed   By: Elmer Picker M.D.   On: 11/16/2021 12:14    Disposition Plan & Communication  Patient status: Inpatient  Admitted From: Home Planned disposition location: Home health recommended (patient refusing home health) Anticipated discharge date: 3/6 pending improvement in Na+, oxygen wean, clinical impovement  Family Communication: none    Author: Richarda Osmond, DO [facility] 11/17/2021, 7:40 AM   Available by Epic secure chat 7AM-7PM. If 7PM-7AM, please contact night-coverage.  [facility] contact information found on [email or web address].

## 2021-11-17 NOTE — Progress Notes (Signed)
Occupational Therapy Treatment ?Patient Details ?Name: Kendra Clarke ?MRN: [id number] ?DOB: [birth year] ?Today's Date: 11/17/2021 ? ? ?History of present illness Pt is an 81 y.o. female presenting to hospital 2/26 with c/o SOB, cough, fatigue, increasing weakness, nausea, difficulty tolerating p.o., diarrhea, and significant lightheadedness with standing causing her to fall several times.  Pt admitted with acute respiratory disease and sepsis d/t COVID-19 virus, COPD exacerbation, leukopenia, and thrombocyptopenia.  PMH includes sepsis, DM, htn, HLD, DM, COPD, large ventral hernia, and superficial thrombophlebitis. ?  ?OT comments ? Upon entering room, pt attempting to move to EOB and reporting urgency for urination. Pt needing min HHA for transfer to [facility]. Pt needing assistance with clothing management and min A for balance during clothing management. Pt reports just feeling unwell and B UEs with increased edema this session. Min A to return to bed with assistance for B LEs. Pt asking to rest secondary to fatigue. All needs within reach.  ? ?Recommendations for follow up therapy are one component of a multi-disciplinary discharge planning process, led by the attending physician.  Recommendations may be updated based on patient status, additional functional criteria and insurance authorization. ?   ?Follow Up Recommendations ? Home health OT  ?  ?Assistance Recommended at Discharge Intermittent Supervision/Assistance  ?Patient can return home with the following ? A little help with walking and/or transfers;A little help with bathing/dressing/bathroom;Help with stairs or ramp for entrance;Assist for transportation;Assistance with cooking/housework ?  ?Equipment Recommendations ? BSC/3in1  ?  ?   ?Precautions / Restrictions Precautions ?Precautions: Fall ?Restrictions ?Weight Bearing Restrictions: No  ? ? ?  ? ?Mobility Bed Mobility ?Overal bed mobility: Needs Assistance ?Bed Mobility: Supine to Sit, Sit to Supine ?  ?   ?Supine to sit: Supervision ?Sit to supine: Min assist ?  ?General bed mobility comments: pt was progressing to EOB upon walking into room. ?  ? ?Transfers ?Overall transfer level: Needs assistance ?Equipment used: 1 person hand held assist ?Transfers: Sit to/from Stand, Bed to chair/wheelchair/BSC ?Sit to Stand: Min assist ?  ?  ?Step pivot transfers: Min assist ?  ?  ?  ?  ?  ?Balance Overall balance assessment: Needs assistance ?Sitting-balance support: No upper extremity supported, Feet supported ?Sitting balance-Leahy Scale: Good ?Sitting balance - Comments: steady sitting reaching within BOS ?  ?Standing balance support: Bilateral upper extremity supported, During functional activity ?Standing balance-Leahy Scale: Fair ?Standing balance comment: steady static standing with at least single UE support ?  ?  ?  ?  ?  ?  ?  ?  ?  ?  ?  ?   ? ?ADL either performed or assessed with clinical judgement  ? ?ADL Overall ADL's : Needs assistance/impaired ?  ?  ?  ?  ?  ?  ?  ?  ?  ?  ?  ?  ?Toilet Transfer: Minimal assistance;BSC/3in1;Stand-pivot ?  ?Toileting- Clothing Manipulation and Hygiene: Minimal assistance;Sit to/from stand ?  ?  ?  ?  ?  ?  ? ?Extremity/Trunk Assessment Upper Extremity Assessment ?Upper Extremity Assessment: Generalized weakness ?  ?Lower Extremity Assessment ?Lower Extremity Assessment: Generalized weakness ?  ?  ?  ? ?Vision Patient Visual Report: No change from baseline ?  ?  ?   ?   ? ?Cognition Arousal/Alertness: Awake/alert ?Behavior During Therapy: [facility] for tasks assessed/performed ?Overall Cognitive Status: Within Functional Limits for tasks assessed ?  ?  ?  ?  ?  ?  ?  ?  ?  ?  ?  ?  ?  ?  ?  ?  ?  General Comments: Pt is A and O x 4 ?  ?  ?   ?   ?   ?   ? ? ?Pertinent Vitals/ Pain       Pain Assessment ?Pain Assessment: No/denies pain ? ?   ?   ? ?Frequency ? Min 2X/week  ? ? ? ? ?  ?Progress Toward Goals ? ?OT Goals(current goals can now be found in the care plan section) ? Progress  towards OT goals: Progressing toward goals ? ?Acute Rehab OT Goals ?Patient Stated Goal: to feel better ?OT Goal Formulation: With patient ?Time For Goal Achievement: 11/27/21 ?Potential to Achieve Goals: Fair  ?Plan Discharge plan remains appropriate;Frequency remains appropriate   ? ?   ?AM-PAC OT "6 Clicks" Daily Activity     ?Outcome Measure ? ? Help from another person eating meals?: None ?Help from another person taking care of personal grooming?: A Little ?Help from another person toileting, which includes using toliet, bedpan, or urinal?: A Little ?Help from another person bathing (including washing, rinsing, drying)?: A Little ?Help from another person to put on and taking off regular upper body clothing?: None ?Help from another person to put on and taking off regular lower body clothing?: A Little ?6 Click Score: 20 ? ?  ?End of Session   ? ?OT Visit Diagnosis: Unsteadiness on feet (R26.81);Muscle weakness (generalized) (M62.81) ?  ?Activity Tolerance Patient tolerated treatment well ?  ?Patient Left in bed;with call bell/phone within reach;with bed alarm set ?  ?Nurse Communication Mobility status ?  ? ?   ? ?Time: 1980-2217 ?OT Time Calculation (min): 17 min ? ?Charges: OT General Charges ?$OT Visit: 1 Visit ?OT Treatments ?$Self Care/Home Management : 8-22 mins ? ?Jackquline Denmark, MS, OTR/L , CBIS ?ascom [phone]  ?11/17/21, 1:51 PM  ?

## 2021-11-17 NOTE — Progress Notes (Signed)
[facility]  ROUNDING NOTE   Subjective:   Patient sitting up in bed States she feels "rough" today. Poor appetite remains  Sodium 127 (126)  Objective:  Vital signs in last 24 hours:  Temp:  [97.6 F (36.4 C)-98.6 F (37 C)] 98.6 F (37 C) (03/03 0835) Pulse Rate:  [92-100] 92 (03/03 0835) Resp:  [18-20] 20 (03/03 0835) BP: (122-157)/(51-67) 131/51 (03/03 0835) SpO2:  [99 %-100 %] 99 % (03/03 0835)  Weight change:  Filed Weights   11/12/21 0554  Weight: 77.1 kg    Intake/Output: I/O last 3 completed shifts: In: 29 [IV Piggyback:50] Out: -    Intake/Output this shift:  No intake/output data recorded.  Physical Exam: General: NAD, ill appearing  Head: Normocephalic, atraumatic. Moist oral mucosal membranes  Eyes: Anicteric  Lungs:  Clear to auscultation, normal effort  Heart: Regular rate and rhythm  Abdomen:  Soft, nontender  Extremities:  1+ peripheral edema.  Neurologic: Nonfocal, moving all four extremities  Skin: No lesions       Basic Metabolic Panel: Recent Labs  Lab 11/12/21 0819 11/12/21 0856 11/12/21 1643 11/13/21 0433 11/14/21 0530 11/14/21 1512 11/15/21 0614 11/16/21 0527 11/17/21 0458  NA  --  121*   < > 129* 126* 127* 128* 126* 127*  K  --  2.5*   < > 3.5 4.3  --  4.6 5.1 4.8  CL  --  80*   < > 94* 93*  --  96* 95* 96*  CO2  --  28   < > 25 25  --  25 23 26   GLUCOSE  --  129*   < > 199* 228*  --  225* 262* 204*  BUN  --  9   < > 8 11  --  14 21 24*  CREATININE  --  0.75   < > 0.71 0.73  --  0.83 1.03* 1.07*  CALCIUM  --  8.5*   < > 8.0* 8.1*  --  8.4* 8.9 8.8*  MG 1.2*  --   --  1.8  --   --   --   --   --   PHOS  --  2.2*  --   --   --   --   --   --   --    < > = values in this interval not displayed.    Liver Function Tests: Recent Labs  Lab 11/12/21 0558 11/17/21 0458  AST 66* 44*  ALT 34 40  ALKPHOS 81 66  BILITOT 0.8 1.1  PROT 6.3* 5.7*  ALBUMIN 3.0* 2.8*   Recent Labs  Lab 11/12/21 0558  LIPASE  43   No results for input(s): AMMONIA in the last 168 hours.  CBC: Recent Labs  Lab 11/12/21 0558 11/13/21 0433 11/14/21 0530 11/15/21 0614  WBC 3.9* 1.4* 7.1 10.9*  NEUTROABS  --   --  6.3 10.0*  HGB 13.4 13.3 13.0 13.4  HCT 38.4 38.2 38.2 39.2  MCV 85.5 85.7 87.4 87.5  PLT 145* 99* 141* 192    Cardiac Enzymes: No results for input(s): CKTOTAL, CKMB, CKMBINDEX, TROPONINI in the last 168 hours.  BNP: Invalid input(s): POCBNP  CBG: Recent Labs  Lab 11/16/21 0758 11/16/21 1209 11/16/21 1738 11/16/21 2016 11/17/21 0838  GLUCAP 241* 256* 270* 237* 180*    Microbiology: Results for orders placed or performed during the hospital encounter of 11/12/21  Resp Panel by RT-PCR (Flu A&B, Covid) Nasopharyngeal Swab  Status: Abnormal   Collection Time: 11/12/21  8:19 AM   Specimen: Nasopharyngeal Swab; Nasopharyngeal(NP) swabs in vial transport medium  Result Value Ref Range Status   SARS Coronavirus 2 by RT PCR POSITIVE (A) NEGATIVE Final    Comment: (NOTE) SARS-CoV-2 target nucleic acids are DETECTED.  The SARS-CoV-2 RNA is generally detectable in upper respiratory specimens during the acute phase of infection. Positive results are indicative of the presence of the identified virus, but do not rule out bacterial infection or co-infection with other pathogens not detected by the test. Clinical correlation with patient history and other diagnostic information is necessary to determine patient infection status. The expected result is Negative.  Fact Sheet for Patients: BloggerCourse.comhttps://www.fda.gov/media/152166/download  Fact Sheet for Healthcare Providers: SeriousBroker.ithttps://www.fda.gov/media/152162/download  This test is not yet approved or cleared by the [place] FDA and  has been authorized for detection and/or diagnosis of SARS-CoV-2 by FDA under an Emergency Use Authorization (EUA).  This EUA will remain in effect (meaning this test can be used) for the duration of  the  COVID-19 declaration under Section 564(b)(1) of the A ct, 21 U.S.C. section 360bbb-3(b)(1), unless the authorization is terminated or revoked sooner.     Influenza A by PCR NEGATIVE NEGATIVE Final   Influenza B by PCR NEGATIVE NEGATIVE Final    Comment: (NOTE) The Xpert Xpress SARS-CoV-2/FLU/RSV plus assay is intended as an aid in the diagnosis of influenza from Nasopharyngeal swab specimens and should not be used as a sole basis for treatment. Nasal washings and aspirates are unacceptable for Xpert Xpress SARS-CoV-2/FLU/RSV testing.  Fact Sheet for Patients: BloggerCourse.comhttps://www.fda.gov/media/152166/download  Fact Sheet for Healthcare Providers: SeriousBroker.ithttps://www.fda.gov/media/152162/download  This test is not yet approved or cleared by the [place] FDA and has been authorized for detection and/or diagnosis of SARS-CoV-2 by FDA under an Emergency Use Authorization (EUA). This EUA will remain in effect (meaning this test can be used) for the duration of the COVID-19 declaration under Section 564(b)(1) of the Act, 21 U.S.C. section 360bbb-3(b)(1), unless the authorization is terminated or revoked.  Performed at [facility], [address] [place], [place] [address]   Blood culture (single)     Status: None   Collection Time: 11/12/21  8:19 AM   Specimen: BLOOD  Result Value Ref Range Status   Specimen Description BLOOD RIGHT ANTECUBITAL  Final   Special Requests   Final    BOTTLES DRAWN AEROBIC AND ANAEROBIC Blood Culture results may not be optimal due to an excessive volume of blood received in culture bottles   Culture   Final    NO GROWTH 5 DAYS Performed at [facility], [address] [place], [place] [address]    Report Status 11/17/2021 FINAL  Final  Expectorated Sputum Assessment w Gram Stain, Rflx to Resp Cult     Status: None   Collection Time: 11/17/21  7:15 AM   Specimen: Sputum  Result Value Ref Range Status   Specimen Description SPUTUM  Final    Special Requests NONE  Final   Sputum evaluation   Final    THIS SPECIMEN IS ACCEPTABLE FOR SPUTUM CULTURE Performed at [facility], [address] [place], [place] [address]    Report Status 11/17/2021 FINAL  Final    Coagulation Studies: No results for input(s): LABPROT, INR in the last 72 hours.  Urinalysis: No results for input(s): COLORURINE, LABSPEC, PHURINE, GLUCOSEU, HGBUR, BILIRUBINUR, KETONESUR, PROTEINUR, UROBILINOGEN, NITRITE, LEUKOCYTESUR in the last 72 hours.  Invalid input(s): APPERANCEUR  Imaging: DG Abd 1 View  Result Date: 11/15/2021 CLINICAL DATA:  Nausea vomiting. EXAM: ABDOMEN - 1 VIEW COMPARISON:  CT abdomen pelvis dated 05/03/2015. FINDINGS: No bowel dilatation or evidence of obstruction. No free air. Right upper quadrant cholecystectomy clips. The soft tissues are unremarkable. Degenerative changes of the spine. IMPRESSION: Negative. Electronically Signed   By: Elgie Collard M.D.   On: 11/15/2021 21:36   US Venous Img Upper Uni Right(DVT)  Result Date: 11/16/2021 CLINICAL DATA:  Pain and swelling EXAM: Right UPPER EXTREMITY VENOUS DOPPLER ULTRASOUND TECHNIQUE: Gray-scale sonography with graded compression, as well as color Doppler and duplex ultrasound were performed to evaluate the upper extremity deep venous system from the level of the subclavian vein and including the jugular, axillary, basilic, radial, ulnar and upper cephalic vein. Spectral Doppler was utilized to evaluate flow at rest and with distal augmentation maneuvers. COMPARISON:  None. FINDINGS: Contralateral Subclavian Vein: Respiratory phasicity is normal and symmetric with the symptomatic side. No evidence of thrombus. Normal compressibility. Internal Jugular Vein: No evidence of thrombus. Normal compressibility, respiratory phasicity and response to augmentation. Subclavian Vein: No evidence of thrombus. Normal compressibility, respiratory phasicity and response to augmentation.  Axillary Vein: No evidence of thrombus. Normal compressibility, respiratory phasicity and response to augmentation. Cephalic Vein: No evidence of thrombus. Normal compressibility, respiratory phasicity and response to augmentation. Basilic Vein: No evidence of thrombus. Normal compressibility, respiratory phasicity and response to augmentation. Brachial Veins: No evidence of thrombus. Normal compressibility, respiratory phasicity and response to augmentation. Radial Veins: No evidence of thrombus. Normal compressibility, respiratory phasicity and response to augmentation. Ulnar Veins: No evidence of thrombus. Normal compressibility, respiratory phasicity and response to augmentation. Venous Reflux:  None visualized. Other Findings: There is edema in subcutaneous plane in the right forearm without loculated fluid collections. IMPRESSION: No evidence of DVT within the right upper extremity. Electronically Signed   By: Ernie Avena M.D.   On: 11/16/2021 12:14     Medications:    albumin human 12.5 g (11/17/21 0933)    amLODipine  10 mg Oral Daily   aspirin EC  81 mg Oral Daily   enoxaparin (LOVENOX) injection  40 mg Subcutaneous Q24H   fluticasone  2 puff Inhalation BID   furosemide  10 mg Oral BID   guaiFENesin  600 mg Oral BID   insulin aspart  0-9 Units Subcutaneous TID WC   insulin aspart  3 Units Subcutaneous TID WC   insulin glargine-yfgn  20 Units Subcutaneous Daily   lisinopril  20 mg Oral Daily   mouth rinse  15 mL Mouth Rinse BID   multivitamin with minerals  1 tablet Oral Daily   nystatin  5 mL Oral QID   pantoprazole  40 mg Oral Daily   polyethylene glycol  17 g Oral BID   predniSONE  40 mg Oral Q breakfast   senna  1 tablet Oral Daily   simethicone  160 mg Oral Once   acetaminophen, alum & mag hydroxide-simeth, ipratropium, ondansetron (ZOFRAN) IV, sorbitol, milk of mag, mineral oil, glycerin (SMOG) enema  Assessment/ Plan:  Ms. MEDHA SIROKY is a 81 y.o.  female  with  medical conditions including hypertension, diabetes, hyperlipidemia, asthma, superficial thrombophlebitis, who was admitted to [facility] on 11/12/2021 for Acute bronchitis [J20.9] Hypokalemia [E87.6] Hyponatremia [E87.1] Bronchitis [J40] COPD exacerbation (HCC) [J44.1]   Hyponatremia with baseline 129-131.  Treated with fluid restriction and IV fluids with initial improvement to 129.  Due to lung disease and cirrhosis, not a  candidate for Tolvaptan.  Will continue Albumin and Furosemide twice daily. Continue fluid restriction.    2.  Hypertension.  Currently receiving amlodipine and lisinopril.  BP 131/51   3.  Type 2 diabetes mellitus.  Noninsulin-dependent.  Home medications include glipizide and Tradjenta.  Primary team to manage SSI    LOS: 5 Vinay Ertl 3/3/202310:45 AM

## 2021-11-18 ENCOUNTER — Inpatient Hospital Stay: Payer: Medicare HMO

## 2021-11-18 DIAGNOSIS — U071 COVID-19: Secondary | ICD-10-CM | POA: Diagnosis not present

## 2021-11-18 DIAGNOSIS — J069 Acute upper respiratory infection, unspecified: Secondary | ICD-10-CM | POA: Diagnosis not present

## 2021-11-18 LAB — RENAL FUNCTION PANEL
Albumin: 3.3 g/dL — ABNORMAL LOW (ref 3.5–5.0)
Anion gap: 9 (ref 5–15)
BUN: 20 mg/dL (ref 8–23)
CO2: 26 mmol/L (ref 22–32)
Calcium: 9 mg/dL (ref 8.9–10.3)
Chloride: 91 mmol/L — ABNORMAL LOW (ref 98–111)
Creatinine, Ser: 0.91 mg/dL (ref 0.44–1.00)
GFR, Estimated: >60 mL/min (ref >60)
Glucose, Bld: 177 mg/dL — ABNORMAL HIGH (ref 70–99)
Phosphorus: 1.7 mg/dL — ABNORMAL LOW (ref 2.5–4.6)
Potassium: 4 mmol/L (ref 3.5–5.1)
Sodium: 126 mmol/L — ABNORMAL LOW (ref 135–145)

## 2021-11-18 LAB — BASIC METABOLIC PANEL
Anion gap: 5 (ref 5–15)
BUN: 20 mg/dL (ref 8–23)
CO2: 25 mmol/L (ref 22–32)
Calcium: 8.5 mg/dL — ABNORMAL LOW (ref 8.9–10.3)
Chloride: 87 mmol/L — ABNORMAL LOW (ref 98–111)
Creatinine, Ser: 0.99 mg/dL (ref 0.44–1.00)
GFR, Estimated: 58 mL/min — ABNORMAL LOW (ref >60)
Glucose, Bld: 104 mg/dL — ABNORMAL HIGH (ref 70–99)
Potassium: 3.6 mmol/L (ref 3.5–5.1)
Sodium: 117 mmol/L — CL (ref 135–145)

## 2021-11-18 LAB — GLUCOSE, CAPILLARY
Glucose-Capillary: 100 mg/dL — ABNORMAL HIGH (ref 70–99)
Glucose-Capillary: 112 mg/dL — ABNORMAL HIGH (ref 70–99)
Glucose-Capillary: 128 mg/dL — ABNORMAL HIGH (ref 70–99)
Glucose-Capillary: 135 mg/dL — ABNORMAL HIGH (ref 70–99)
Glucose-Capillary: 156 mg/dL — ABNORMAL HIGH (ref 70–99)
Glucose-Capillary: 209 mg/dL — ABNORMAL HIGH (ref 70–99)
Glucose-Capillary: 62 mg/dL — ABNORMAL LOW (ref 70–99)
Glucose-Capillary: 92 mg/dL (ref 70–99)

## 2021-11-18 LAB — BLOOD GAS, ARTERIAL
Acid-Base Excess: 0.3 mmol/L (ref 0.0–2.0)
Acid-Base Excess: 1 mmol/L (ref 0.0–2.0)
Bicarbonate: 23.3 mmol/L (ref 20.0–28.0)
Bicarbonate: 25.2 mmol/L (ref 20.0–28.0)
FIO2: 21 %
O2 Content: 2 L/min
O2 Saturation: 99.7 %
O2 Saturation: 99.5 %
Patient temperature: 37
Patient temperature: 37
pCO2 arterial: 32 mmHg (ref 32–48)
pCO2 arterial: 38 mmHg (ref 32–48)
pH, Arterial: 7.47 — ABNORMAL HIGH (ref 7.35–7.45)
pH, Arterial: 7.43 (ref 7.35–7.45)
pO2, Arterial: 92 mmHg (ref 83–108)
pO2, Arterial: 85 mmHg (ref 83–108)

## 2021-11-18 LAB — CBC
HCT: 38.2 % (ref 36.0–46.0)
Hemoglobin: 13 g/dL (ref 12.0–15.0)
MCH: 29.7 pg (ref 26.0–34.0)
MCHC: 34 g/dL (ref 30.0–36.0)
MCV: 87.2 fL (ref 80.0–100.0)
Platelets: 210 10*3/uL (ref 150–400)
RBC: 4.38 MIL/uL (ref 3.87–5.11)
RDW: 12.3 % (ref 11.5–15.5)
WBC: 9.8 10*3/uL (ref 4.0–10.5)
nRBC: 0 % (ref 0.0–0.2)

## 2021-11-18 LAB — T3: T3, Total: 73 ng/dL (ref 71–180)

## 2021-11-18 MED ORDER — DEXTROSE 50 % IV SOLN: 12.5000 g | INTRAVENOUS | Status: AC

## 2021-11-18 MED ORDER — DEXTROSE 50 % IV SOLN
INTRAVENOUS | Status: AC
  Administered 2021-11-18: 12.5 g via INTRAVENOUS
  Filled 2021-11-18: qty 50

## 2021-11-18 MED ORDER — FLUMAZENIL 0.5 MG/5ML IV SOLN
0.2000 mg | Freq: Once | INTRAVENOUS | Status: AC
  Administered 2021-11-18: 0.2 mg via INTRAVENOUS
  Filled 2021-11-18 (×3): qty 5

## 2021-11-18 MED ORDER — LORAZEPAM 2 MG/ML IJ SOLN
2.0000 mg | Freq: Once | INTRAMUSCULAR | Status: AC | PRN
  Administered 2021-11-18: 2 mg via INTRAVENOUS
  Filled 2021-11-18: qty 1

## 2021-11-18 MED ORDER — FUROSEMIDE 10 MG/ML IJ SOLN
20.0000 mg | Freq: Once | INTRAMUSCULAR | Status: AC
Start: 2021-11-18 — End: 2021-11-18
  Administered 2021-11-18: 20 mg via INTRAVENOUS
  Filled 2021-11-18: qty 2

## 2021-11-18 NOTE — Progress Notes (Signed)
[facility]  ROUNDING NOTE   Subjective:    Patient seen today on first floor Patient voices no new specific physical concerns  Objective:  Vital signs in last 24 hours:  Temp:  [97.5 F (36.4 C)-98.4 F (36.9 C)] 97.9 F (36.6 C) (03/04 0756) Pulse Rate:  [78-91] 90 (03/04 0756) Resp:  [16-20] 16 (03/04 0756) BP: (129-167)/(64-74) 167/74 (03/04 0756) SpO2:  [95 %-99 %] 96 % (03/04 0756)  Weight change:  Filed Weights   11/12/21 0554  Weight: 77.1 kg    Intake/Output: I/O last 3 completed shifts: In: 54 [IV Piggyback:50] Out: -    Intake/Output this shift:  No intake/output data recorded.  Physical Exam: General: NAD, ill appearing  Head: Normocephalic, atraumatic. Moist oral mucosal membranes  Eyes: Anicteric  Lungs:  Clear to auscultation, normal effort  Heart: Regular rate and rhythm  Abdomen:  Soft, nontender  Extremities:  1+ peripheral edema.Right upper extremity edematous  Neurologic: Nonfocal, moving all four extremities  Skin: No lesions       Basic Metabolic Panel: Recent Labs  Lab 11/12/21 0819 11/12/21 0856 11/12/21 1643 11/13/21 0433 11/14/21 0530 11/14/21 1512 11/15/21 0614 11/16/21 0527 11/17/21 0458 11/18/21 0500  NA  --  121*   < > 129* 126* 127* 128* 126* 127* 126*  K  --  2.5*   < > 3.5 4.3  --  4.6 5.1 4.8 4.0  CL  --  80*   < > 94* 93*  --  96* 95* 96* 91*  CO2  --  28   < > 25 25  --  25 23 26 26   GLUCOSE  --  129*   < > 199* 228*  --  225* 262* 204* 177*  BUN  --  9   < > 8 11  --  14 21 24* 20  CREATININE  --  0.75   < > 0.71 0.73  --  0.83 1.03* 1.07* 0.91  CALCIUM  --  8.5*   < > 8.0* 8.1*  --  8.4* 8.9 8.8* 9.0  MG 1.2*  --   --  1.8  --   --   --   --   --   --   PHOS  --  2.2*  --   --   --   --   --   --   --  1.7*   < > = values in this interval not displayed.    Liver Function Tests: Recent Labs  Lab 11/12/21 0558 11/17/21 0458 11/18/21 0500  AST 66* 44*  --   ALT 34 40  --   ALKPHOS 81 66   --   BILITOT 0.8 1.1  --   PROT 6.3* 5.7*  --   ALBUMIN 3.0* 2.8* 3.3*   Recent Labs  Lab 11/12/21 0558  LIPASE 43   No results for input(s): AMMONIA in the last 168 hours.  CBC: Recent Labs  Lab 11/12/21 0558 11/13/21 0433 11/14/21 0530 11/15/21 0614 11/18/21 0500  WBC 3.9* 1.4* 7.1 10.9* 9.8  NEUTROABS  --   --  6.3 10.0*  --   HGB 13.4 13.3 13.0 13.4 13.0  HCT 38.4 38.2 38.2 39.2 38.2  MCV 85.5 85.7 87.4 87.5 87.2  PLT 145* 99* 141* 192 210    Cardiac Enzymes: No results for input(s): CKTOTAL, CKMB, CKMBINDEX, TROPONINI in the last 168 hours.  BNP: Invalid input(s): POCBNP  CBG: Recent Labs  Lab 11/17/21 1217 11/17/21  1730 11/17/21 1958 11/18/21 0759 11/18/21 1239  GLUCAP 176* 204* 215* 209* 128*    Microbiology: Results for orders placed or performed during the hospital encounter of 11/12/21  Resp Panel by RT-PCR (Flu A&B, Covid) Nasopharyngeal Swab     Status: Abnormal   Collection Time: 11/12/21  8:19 AM   Specimen: Nasopharyngeal Swab; Nasopharyngeal(NP) swabs in vial transport medium  Result Value Ref Range Status   SARS Coronavirus 2 by RT PCR POSITIVE (A) NEGATIVE Final    Comment: (NOTE) SARS-CoV-2 target nucleic acids are DETECTED.  The SARS-CoV-2 RNA is generally detectable in upper respiratory specimens during the acute phase of infection. Positive results are indicative of the presence of the identified virus, but do not rule out bacterial infection or co-infection with other pathogens not detected by the test. Clinical correlation with patient history and other diagnostic information is necessary to determine patient infection status. The expected result is Negative.  Fact Sheet for Patients: BloggerCourse.com  Fact Sheet for Healthcare Providers: SeriousBroker.it  This test is not yet approved or cleared by the [place] FDA and  has been authorized for detection and/or  diagnosis of SARS-CoV-2 by FDA under an Emergency Use Authorization (EUA).  This EUA will remain in effect (meaning this test can be used) for the duration of  the COVID-19 declaration under Section 564(b)(1) of the A ct, 21 U.S.C. section 360bbb-3(b)(1), unless the authorization is terminated or revoked sooner.     Influenza A by PCR NEGATIVE NEGATIVE Final   Influenza B by PCR NEGATIVE NEGATIVE Final    Comment: (NOTE) The Xpert Xpress SARS-CoV-2/FLU/RSV plus assay is intended as an aid in the diagnosis of influenza from Nasopharyngeal swab specimens and should not be used as a sole basis for treatment. Nasal washings and aspirates are unacceptable for Xpert Xpress SARS-CoV-2/FLU/RSV testing.  Fact Sheet for Patients: BloggerCourse.com  Fact Sheet for Healthcare Providers: SeriousBroker.it  This test is not yet approved or cleared by the [place] FDA and has been authorized for detection and/or diagnosis of SARS-CoV-2 by FDA under an Emergency Use Authorization (EUA). This EUA will remain in effect (meaning this test can be used) for the duration of the COVID-19 declaration under Section 564(b)(1) of the Act, 21 U.S.C. section 360bbb-3(b)(1), unless the authorization is terminated or revoked.  Performed at [facility], [address] [place], [place] [address]   Blood culture (single)     Status: None   Collection Time: 11/12/21  8:19 AM   Specimen: BLOOD  Result Value Ref Range Status   Specimen Description BLOOD RIGHT ANTECUBITAL  Final   Special Requests   Final    BOTTLES DRAWN AEROBIC AND ANAEROBIC Blood Culture results may not be optimal due to an excessive volume of blood received in culture bottles   Culture   Final    NO GROWTH 5 DAYS Performed at [facility], [address], [place], [place] [address]    Report Status 11/17/2021 FINAL  Final  Expectorated Sputum Assessment w Gram  Stain, Rflx to Resp Cult     Status: None   Collection Time: 11/17/21  7:15 AM   Specimen: Sputum  Result Value Ref Range Status   Specimen Description SPUTUM  Final   Special Requests NONE  Final   Sputum evaluation   Final    THIS SPECIMEN IS ACCEPTABLE FOR SPUTUM CULTURE Performed at [facility], [address], [place], [place] [address]    Report Status 11/17/2021 FINAL  Final  Culture, Respiratory w Gram Stain     Status: None (Preliminary result)   Collection Time: 11/17/21  7:15 AM   Specimen: SPU  Result Value Ref Range Status   Specimen Description   Final    SPUTUM Performed at [facility], [address] [place], [place] [address]    Special Requests   Final    NONE Reflexed from [id number] Performed at [facility], [address] [place], [place] [address]    Gram Stain   Final    FEW WBC PRESENT,BOTH PMN AND MONONUCLEAR FEW GRAM POSITIVE COCCI IN PAIRS    Culture   Final    CULTURE REINCUBATED FOR BETTER GROWTH Performed at [facility], [address] [place], [place] [address]    Report Status PENDING  Incomplete    Coagulation Studies: No results for input(s): LABPROT, INR in the last 72 hours.  Urinalysis: No results for input(s): COLORURINE, LABSPEC, PHURINE, GLUCOSEU, HGBUR, BILIRUBINUR, KETONESUR, PROTEINUR, UROBILINOGEN, NITRITE, LEUKOCYTESUR in the last 72 hours.  Invalid input(s): APPERANCEUR    Imaging: No results found.   Medications:    albumin human 12.5 g (11/18/21 0825)    amLODipine  10 mg Oral Daily   aspirin EC  81 mg Oral Daily   atenolol  25 mg Oral Daily   enoxaparin (LOVENOX) injection  40 mg Subcutaneous Q24H   fluticasone  2 puff Inhalation BID   furosemide  10 mg Oral BID   guaiFENesin  600 mg Oral BID   insulin aspart  0-9 Units Subcutaneous TID WC   insulin aspart  3 Units Subcutaneous TID WC   insulin glargine-yfgn  20 Units Subcutaneous Daily   lisinopril  20 mg Oral Daily    mouth rinse  15 mL Mouth Rinse BID   multivitamin with minerals  1 tablet Oral Daily   nystatin  5 mL Oral QID   pantoprazole  40 mg Oral Daily   polyethylene glycol  17 g Oral BID   predniSONE  40 mg Oral Q breakfast   senna  1 tablet Oral Daily   acetaminophen, alum & mag hydroxide-simeth, ipratropium, ondansetron (ZOFRAN) IV, prochlorperazine, sorbitol, milk of mag, mineral oil, glycerin (SMOG) enema  Assessment/ Plan:  Kendra Clarke is a 81 y.o.  female  with medical conditions including hypertension, diabetes, hyperlipidemia, asthma, superficial thrombophlebitis, who was admitted to [facility] on 11/12/2021 for Acute bronchitis [J20.9] Hypokalemia [E87.6] Hyponatremia [E87.1] Bronchitis [J40] COPD exacerbation (HCC) [J44.1]   Hyponatremia with baseline 129-131.  Treated with fluid restriction and IV fluids with initial improvement to 129. Patient hyponatremia is secondary to her pulmonary and liver issues. 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  Will continue Albumin and Furosemide twice daily.  We will continue fluid restriction.    2.  Hypertension.   Patient blood pressure on the higher side Patient is on amlodipine and atenolol Patient hydrochlorothiazide and lisinopril are currently on hold We will follow-up in case of blood pressure stays high we will start patient on losartan   3.  Type 2 diabetes mellitus.  Noninsulin-dependent.  Home medications include glipizide and Tradjenta.  Primary team to manage SSI   4.Acute kidney injury Patient had AKI and patient creatinine had peaked at 1.07 Patient baseline creatinine is 0.6--0.7 Patient AKI is improving .  5.Hypophosphatemia Patient phosphorus is low This is secondary to poor p.o. intake Will encourage free of oral intake    6. Acute  hypoxic respiratory failure due to COVID-19 Primary team is following    LOS: 6 Pina Sirianni s [facility] 3/4/20232:29 PM

## 2021-11-18 NOTE — Progress Notes (Addendum)
Date and time results received: 11/18/21 2019 ? ?Test: Sodium ?Critical Value: 117 ? ?Name of Provider Notified: Manuela Schwartz NP ?11/18/21 2020 ?Orders Received? Or Actions Taken?: Lasix ordered. ?

## 2021-11-18 NOTE — Progress Notes (Addendum)
? ?  NAME Kendra Clarke                                  DOB: February 15, 1941, 81 y.o.  ?[id number]      ?                        ? ?Subjective: Nurse reports critical sodium level call from lab 117 at 2020 pm this evening from chem panel done as part of altered mental status workup. Also had CT head noted without acute findings ?Receiveed ativan for CT sedation ? ?Objective: ?Vitals:  ? 11/18/21 1702 11/18/21 1943  ?BP:  (!) 109/43  ?Pulse:  68  ?Resp:  18  ?Temp:  98.2 ?F (36.8 ?C)  ?SpO2: 95% 93%  ? ? ? ?Assessment:  ?Neuro: obtunded, responds minimally with groan to sternal rub. Unable to open eyes to noxious stimuli.  ?Respiratory: shallow slow resp pattern. BBS diminished; sats in 80's on room air, improved with supplemental oxygen  ?CV Sinus brady. Generalized edema ? ?Plan:  ?Altered Mental Status -  ?Hypoglycemia - 62  - corrected with 1/2 amp D50 ?[place] 0.2  mg- some improvement in now able to localize to noxious stimuli ?Increased air movements with respirations ?Continue to monitor - patient has sitter at bedside ? ?Hyponatremia ?Discussed with Dr. Wolfgang Phoenix - lasix given, Repeat chem panel 0000 ? ? ?Acute resp failure with hypoxia ?Secondary to oversedation likely.   ?Chest xray done  - Briefly reviewed image bedside, significant cardiomegaly - no other overt findings seen - awaiting official read  ?ABG - normal - 7.43, 38,. 85, 25.2; wean oxygen keep sats above 92 ? ?Hyponatremia ?Discussed with Dr. Wolfgang Phoenix - lasix given, Repeat chem panel 0000 ? ?Hypoglycemia - ?Poor oral intake secondary to increased confusion/encephalopathy - Semglee discontinued ? ?Hypophosphatemia  ?Worsening - poor oral intake, will repeat and supplement if critical ? ?Critical care time - 60 minutes ? ?Patient in acutely  altered mental state with respiratory diepression at risk for complete respiratory failure.  Critical care time spent with ordering, interpreting test. Patient repeated assessments and discussion with  specialists ? ?Manuela Schwartz NP ?[facility] ? ? ?  ?

## 2021-11-18 NOTE — Progress Notes (Signed)
PROGRESS NOTE  Kendra Clarke    DOB: 06/07/1941, 81 y.o.  [id number]    Code Status: Full Code   DOA: 11/12/2021   LOS: 6   Brief hospital course  Kendra Clarke is a 82 y.o. female with a PMH significant for HTN, HLD, type II DM, COPD, asthma, varicose veins, ventral hernia, superficial thrombophlebitis. They presented from home to the ED on 11/12/2021 with cough, SOB, N/V/D x 7 days.  In the ED, it was found that they had COVID+, hyponatremia, bronchitis changes on chest xray. Had negative head CT  They were treated with paxlovid, steroids, IV fluids, and supportive care.  Patient was admitted to medicine service for further workup and management of acute respiratory disorder without hypoxia as outlined in detail below.  11/18/21 -stable, feeling unwell. Improved arm and epigastric pain. Has had Bms  Assessment & Plan  Principal Problem:   Acute respiratory disease due to COVID-19 virus Active Problems:   Sepsis (HCC)   Bronchitis   HTN (hypertension)   HLD (hyperlipidemia)   COPD exacerbation (HCC)   Diabetes mellitus without complication (HCC)   Fall at home, initial encounter   Hyponatremia   Hypokalemia   Disorder of electrolytes   Elevated troponin   Nausea & vomiting   Hypomagnesemia   Abdominal pain   Edema  Sepsis - sepsis criteria have resolved.  Acute hypoxic respiratory failure 2/2 COVID-19   COPD exacerbation- remains stable on 2-3L [place] for comfort. She does not desaturate when self removed from oxygen so asked nurse to remove and monitor. - continue steroids, paxlovid - supportive care PRN - wean to room air - pulse ox with ambulation - bronchodilators PRN, and scheduled - PT/OT- recommending HHPT/OT  Asymmetrical R arm swelling- appears stable and symmetrical to me today. RUE duplex negative for DVT. Endorses improved pain. - analgesia PRN  Epigastric pain   constipation (resolved)- improved. patient had BM this morning - bowel  regimen  Hyperthyroidism? Graves disease?- TSH mildly decreased. potentially subclinical. 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  - T3, T4 pending - thyroid US when feeling improved - will start betablocker now which her HR/BP can tolerate - consider methimazole      HTN- mildly elevated -continue home Amlodipine -Hold HCTZ and lisinopril due to hyponatremia   Hyponatremia   AKI- Sodium 121>>128>126>127>126. Typically COVID associated hyponatremia corrects rapidly but this is prolonged course and technically has AKI with baseline Cr around 0.7 and is 1.03>1.07>0.91 today. - nephrology consult, appreciate recs  - albumin  - lasix -Hold HCTZ -BMP am.  HLD (hyperlipidemia) -Hold Crestor due to Paxlovid use   Diabetes mellitus without complication [facility]): Patient is taking Trulicity at home. Blood sugars elevated with steroid use.  -Sliding scale insulin  Fall at home, initial encounter: CT head negative. -PT/OT   Elevated troponin: Troponin level 21, no chest pain, likely demand ischemia -continue Aspirin   Nausea & vomiting and diarrhea- resolved. Most likely due to COVID infection.  No fever or leukocytosis.  Low suspicions for C. Difficile. Able to tolerate increased diet - supportive care  Body mass index is 29.18 kg/m.  VTE ppx: enoxaparin (LOVENOX) injection 40 mg Start: 11/12/21 1000   Diet:     Diet   Diet Carb Modified Fluid consistency: Thin; Room service appropriate? Yes; Fluid restriction: 1800 mL Fluid   Subjective 11/18/21    Pt reports still not feeling well  but maybe a little improved.    Objective   Vitals:   11/17/21 1956 11/18/21 0019 11/18/21 0419 11/18/21 0756  BP: (!) 152/71 (!) 143/71 (!) 162/72 (!) 167/74  Pulse: 83 79 91 90  Resp: 20 18 18 16   Temp: 98.4 F (36.9 C) 97.8 F (36.6 C) (!) 97.5 F (36.4 C) 97.9 F (36.6 C)  TempSrc: Oral  Oral Oral  SpO2: 98% 99% 99% 96%   Weight:      Height:       No intake or output data in the 24 hours ending 11/18/21 0818  Filed Weights   11/12/21 0554  Weight: 77.1 kg     Physical Exam:  General: awake, alert, NAD. Ill-appearing HEENT: atraumatic, clear conjunctiva, anicteric sclera, MMM, hearing grossly normal Respiratory: normal respiratory effort. CTAB Cardiovascular: normal S1/S2, RRR, no JVD, murmurs, quick capillary refill  Gastrointestinal: soft, non tender to epigastric area Nervous: A&O x3. no gross focal neurologic deficits, normal speech Extremities: moves all equally, generalized edema, normal tone Skin: dry, intact, normal temperature, normal color. No rashes, lesions or ulcers on exposed skin Psychiatry: normal mood, congruent affect  Labs   I have personally reviewed the following labs and imaging studies CBC    Component Value Date/Time   WBC 9.8 11/18/2021 0500   RBC 4.38 11/18/2021 0500   HGB 13.0 11/18/2021 0500   HGB 13.1 10/11/2013 0441   HCT 38.2 11/18/2021 0500   HCT 39.9 10/11/2013 0441   PLT 210 11/18/2021 0500   PLT 247 10/11/2013 0441   MCV 87.2 11/18/2021 0500   MCV 92 10/11/2013 0441   MCH 29.7 11/18/2021 0500   MCHC 34.0 11/18/2021 0500   RDW 12.3 11/18/2021 0500   RDW 14.1 10/11/2013 0441   LYMPHSABS 0.4 (L) 11/15/2021 0614   LYMPHSABS 1.0 10/11/2013 0441   MONOABS 0.4 11/15/2021 0614   MONOABS 0.3 10/11/2013 0441   EOSABS 0.0 11/15/2021 0614   EOSABS 0.0 10/11/2013 0441   BASOSABS 0.0 11/15/2021 0614   BASOSABS 0.0 10/11/2013 0441   BMP Latest Ref Rng & Units 11/18/2021 11/17/2021 11/16/2021  Glucose 70 - 99 mg/dL 01/16/2022) 638(V) 564(P)  BUN 8 - 23 mg/dL 20 329(J) 21  Creatinine 0.44 - 1.00 mg/dL 18(A 4.16) 6.06(T)  Sodium 135 - 145 mmol/L 126(L) 127(L) 126(L)  Potassium 3.5 - 5.1 mmol/L 4.0 4.8 5.1  Chloride 98 - 111 mmol/L 91(L) 96(L) 95(L)  CO2 22 - 32 mmol/L 26 26 23   Calcium 8.9 - 10.3 mg/dL 9.0 0.16(W) 8.9    Venous Img Upper Uni Right(DVT)  Result  Date: 11/16/2021 CLINICAL DATA:  Pain and swelling EXAM: Right UPPER EXTREMITY VENOUS DOPPLER ULTRASOUND TECHNIQUE: Gray-scale sonography with graded compression, as well as color Doppler and duplex ultrasound were performed to evaluate the upper extremity deep venous system from the level of the subclavian vein and including the jugular, axillary, basilic, radial, ulnar and upper cephalic vein. Spectral Doppler was utilized to evaluate flow at rest and with distal augmentation maneuvers. COMPARISON:  None. FINDINGS: Contralateral Subclavian Vein: Respiratory phasicity is normal and symmetric with the symptomatic side. No evidence of thrombus. Normal compressibility. Internal Jugular Vein: No evidence of thrombus. Normal compressibility, respiratory phasicity and response to augmentation. Subclavian Vein: No evidence of thrombus. Normal compressibility, respiratory phasicity and response to augmentation. Axillary Vein: No evidence of thrombus. Normal compressibility, respiratory phasicity and response to augmentation. Cephalic Vein: No evidence of thrombus. Normal compressibility, respiratory phasicity and response to augmentation. Basilic Vein: No  evidence of thrombus. Normal compressibility, respiratory phasicity and response to augmentation. Brachial Veins: No evidence of thrombus. Normal compressibility, respiratory phasicity and response to augmentation. Radial Veins: No evidence of thrombus. Normal compressibility, respiratory phasicity and response to augmentation. Ulnar Veins: No evidence of thrombus. Normal compressibility, respiratory phasicity and response to augmentation. Venous Reflux:  None visualized. Other Findings: There is edema in subcutaneous plane in the right forearm without loculated fluid collections. IMPRESSION: No evidence of DVT within the right upper extremity. Electronically Signed   By: Ernie Avena M.D.   On: 11/16/2021 12:14    Disposition Plan & Communication  Patient  status: Inpatient  Admitted From: Home Planned disposition location: Home health recommended (patient refusing home health) Anticipated discharge date: 3/7 pending improvement in Na+, oxygen wean, clinical impovement  Family Communication: none    Author: Leeroy Bock, DO [facility] 11/18/2021, 8:18 AM   Available by Epic secure chat 7AM-7PM. If 7PM-7AM, please contact night-coverage.  [facility] contact information found on [email or web address].

## 2021-11-19 DIAGNOSIS — J441 Chronic obstructive pulmonary disease with (acute) exacerbation: Secondary | ICD-10-CM | POA: Diagnosis not present

## 2021-11-19 DIAGNOSIS — U071 COVID-19: Secondary | ICD-10-CM | POA: Diagnosis not present

## 2021-11-19 DIAGNOSIS — E876 Hypokalemia: Secondary | ICD-10-CM | POA: Diagnosis not present

## 2021-11-19 DIAGNOSIS — E119 Type 2 diabetes mellitus without complications: Secondary | ICD-10-CM | POA: Diagnosis not present

## 2021-11-19 LAB — BASIC METABOLIC PANEL
Anion gap: 7 (ref 5–15)
BUN: 21 mg/dL (ref 8–23)
CO2: 23 mmol/L (ref 22–32)
Calcium: 8.8 mg/dL — ABNORMAL LOW (ref 8.9–10.3)
Chloride: 87 mmol/L — ABNORMAL LOW (ref 98–111)
Creatinine, Ser: 0.97 mg/dL (ref 0.44–1.00)
GFR, Estimated: 59 mL/min — ABNORMAL LOW (ref >60)
Glucose, Bld: 89 mg/dL (ref 70–99)
Potassium: 4.2 mmol/L (ref 3.5–5.1)
Sodium: 117 mmol/L — CL (ref 135–145)

## 2021-11-19 LAB — GLUCOSE, CAPILLARY
Glucose-Capillary: 107 mg/dL — ABNORMAL HIGH (ref 70–99)
Glucose-Capillary: 105 mg/dL — ABNORMAL HIGH (ref 70–99)
Glucose-Capillary: 171 mg/dL — ABNORMAL HIGH (ref 70–99)
Glucose-Capillary: 147 mg/dL — ABNORMAL HIGH (ref 70–99)
Glucose-Capillary: 131 mg/dL — ABNORMAL HIGH (ref 70–99)

## 2021-11-19 LAB — COMPREHENSIVE METABOLIC PANEL
ALT: 50 U/L — ABNORMAL HIGH (ref 0–44)
AST: 69 U/L — ABNORMAL HIGH (ref 15–41)
Albumin: 3.5 g/dL (ref 3.5–5.0)
Alkaline Phosphatase: 65 U/L (ref 38–126)
Anion gap: 10 (ref 5–15)
BUN: 22 mg/dL (ref 8–23)
CO2: 26 mmol/L (ref 22–32)
Calcium: 9 mg/dL (ref 8.9–10.3)
Chloride: 86 mmol/L — ABNORMAL LOW (ref 98–111)
Creatinine, Ser: 1.05 mg/dL — ABNORMAL HIGH (ref 0.44–1.00)
GFR, Estimated: 54 mL/min — ABNORMAL LOW (ref >60)
Glucose, Bld: 103 mg/dL — ABNORMAL HIGH (ref 70–99)
Potassium: 3.8 mmol/L (ref 3.5–5.1)
Sodium: 122 mmol/L — ABNORMAL LOW (ref 135–145)
Total Bilirubin: 1.7 mg/dL — ABNORMAL HIGH (ref 0.3–1.2)
Total Protein: 5.9 g/dL — ABNORMAL LOW (ref 6.5–8.1)

## 2021-11-19 LAB — SODIUM
Sodium: 123 mmol/L — ABNORMAL LOW (ref 135–145)
Sodium: 124 mmol/L — ABNORMAL LOW (ref 135–145)
Sodium: 127 mmol/L — ABNORMAL LOW (ref 135–145)
Sodium: 128 mmol/L — ABNORMAL LOW (ref 135–145)

## 2021-11-19 LAB — CBC WITH DIFFERENTIAL/PLATELET
Abs Immature Granulocytes: 0.16 10*3/uL — ABNORMAL HIGH (ref 0.00–0.07)
Basophils Absolute: 0 10*3/uL (ref 0.0–0.1)
Basophils Relative: 0 %
Eosinophils Absolute: 0 10*3/uL (ref 0.0–0.5)
Eosinophils Relative: 0 %
HCT: 35.2 % — ABNORMAL LOW (ref 36.0–46.0)
Hemoglobin: 12.4 g/dL (ref 12.0–15.0)
Immature Granulocytes: 2 %
Lymphocytes Relative: 16 %
Lymphs Abs: 1.3 10*3/uL (ref 0.7–4.0)
MCH: 29.9 pg (ref 26.0–34.0)
MCHC: 35.2 g/dL (ref 30.0–36.0)
MCV: 84.8 fL (ref 80.0–100.0)
Monocytes Absolute: 1 10*3/uL (ref 0.1–1.0)
Monocytes Relative: 13 %
Neutro Abs: 5.7 10*3/uL (ref 1.7–7.7)
Neutrophils Relative %: 69 %
Platelets: 180 10*3/uL (ref 150–400)
RBC: 4.15 MIL/uL (ref 3.87–5.11)
RDW: 12.3 % (ref 11.5–15.5)
WBC: 8.3 10*3/uL (ref 4.0–10.5)
nRBC: 0 % (ref 0.0–0.2)

## 2021-11-19 LAB — MAGNESIUM: Magnesium: 1.5 mg/dL — ABNORMAL LOW (ref 1.7–2.4)

## 2021-11-19 LAB — PHOSPHORUS
Phosphorus: 2.2 mg/dL — ABNORMAL LOW (ref 2.5–4.6)
Phosphorus: 2.3 mg/dL — ABNORMAL LOW (ref 2.5–4.6)

## 2021-11-19 MED ORDER — CHLORHEXIDINE GLUCONATE CLOTH 2 % EX PADS
6.0000 | MEDICATED_PAD | Freq: Every day | CUTANEOUS | Status: DC
  Administered 2021-11-19 – 2021-11-21 (×3): 6 via TOPICAL

## 2021-11-19 MED ORDER — NYSTATIN 100000 UNIT/ML MT SUSP
5.0000 mL | Freq: Two times a day (BID) | OROMUCOSAL | Status: DC
  Administered 2021-11-19 – 2021-11-21 (×5): 500000 [IU] via ORAL
  Filled 2021-11-19 (×5): qty 5

## 2021-11-19 MED ORDER — MAGNESIUM SULFATE 4 GM/100ML IV SOLN
4.0000 g | Freq: Once | INTRAVENOUS | Status: AC
  Administered 2021-11-19: 4 g via INTRAVENOUS
  Filled 2021-11-19: qty 100

## 2021-11-19 MED ORDER — ZINC OXIDE 40 % EX OINT
TOPICAL_OINTMENT | CUTANEOUS | Status: DC | PRN
  Filled 2021-11-19: qty 113

## 2021-11-19 MED ORDER — METHIMAZOLE 5 MG PO TABS
5.0000 mg | ORAL_TABLET | Freq: Every day | ORAL | Status: DC
  Administered 2021-11-19 – 2021-11-21 (×3): 5 mg via ORAL
  Filled 2021-11-19 (×3): qty 1

## 2021-11-19 MED ORDER — FUROSEMIDE 10 MG/ML IJ SOLN
20.0000 mg | Freq: Once | INTRAMUSCULAR | Status: AC
  Administered 2021-11-19: 20 mg via INTRAVENOUS
  Filled 2021-11-19: qty 2

## 2021-11-19 NOTE — Progress Notes (Signed)
PROGRESS NOTE  Kendra Clarke    DOB: 16-Dec-1940, 81 y.o.  [id number]    Code Status: Full Code   DOA: 11/12/2021   LOS: 7   Brief hospital course  Kendra Clarke is a 81 y.o. female with a PMH significant for HTN, HLD, type II DM, COPD, asthma, varicose veins, ventral hernia, superficial thrombophlebitis. They presented from home to the ED on 11/12/2021 with cough, SOB, N/V/D x 7 days.  In the ED, it was found that they had COVID+, hyponatremia, bronchitis changes on chest xray. Had negative head CT  They were treated with paxlovid, steroids, IV fluids, and supportive care.  Patient was admitted to medicine service for further workup and management of acute respiratory disorder without hypoxia as outlined in detail below.  11/19/21 -stable, feeling tired but somewhat improved  Assessment & Plan  Principal Problem:   Acute respiratory disease due to COVID-19 virus Active Problems:   Sepsis ([place])   Bronchitis   HTN (hypertension)   HLD (hyperlipidemia)   COPD exacerbation (HCC)   Diabetes mellitus without complication ([place])   Fall at home, initial encounter   Hyponatremia   Hypokalemia   Disorder of electrolytes   Elevated troponin   Nausea & vomiting   Hypomagnesemia   Abdominal pain   Edema  Sepsis - sepsis criteria have resolved.  Acute hypoxic respiratory failure 2/2 COVID-19   COPD exacerbation- She is on room air this morning. S/p paxlovid - continue steroids - supportive care PRN - pulse ox with ambulation - bronchodilators PRN, and scheduled - PT/OT- recommending HHPT/OT  Asymmetrical R arm swelling- appears stable and symmetrical to me today. RUE duplex negative for DVT. Endorses improved pain. - analgesia PRN  Epigastric pain   constipation (resolved)- improved. patient had Bms  - bowel regimen  Hyperthyroidism? Graves disease?- TSH mildly decreased. potentially subclinical. 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 T4 mildly elevated, normal T3.  - thyroid US when feeling improved - will start betablocker now which her HR/BP can tolerate - starting low dose methimazole      HTN- mildly elevated -continue home Amlodipine -Hold HCTZ and lisinopril due to hyponatremia   Hyponatremia   AKI- Sodium 121>>128>126>127>126. Typically COVID associated hyponatremia corrects rapidly but this is prolonged course. Patient has had poor PO intake with feeling ill and had acute event of Na+ 117 overnight which has been improved with dextrose, lasix and today is 122>123  technically has AKI with baseline Cr around 0.7 and is 1.03>1.07>0.91 today. - nephrology consult, appreciate recs  - albumin  - lasix - Na+ monitoring q4 hours - encouraged PO intake -Hold HCTZ -BMP am.  HLD -chronic, stable.  -Hold Crestor due to Paxlovid use   Diabetes mellitus without complication [facility]): Patient is taking Trulicity at home. Blood sugars elevated with steroid use.  -Sliding scale insulin  Fall at home, initial encounter: CT head negative. -PT/OT   Elevated troponin: Troponin level 21, no chest pain, likely demand ischemia -continue Aspirin   Nausea & vomiting and diarrhea- resolved. Most likely due to COVID infection.  No fever or leukocytosis.  Low suspicions for C. Difficile. Able to tolerate increased diet - supportive care  Body mass index is 29.18 kg/m.  VTE ppx: enoxaparin (LOVENOX) injection 40 mg Start: 11/12/21 1000   Diet:     Diet   Diet Carb Modified Fluid consistency: Thin; Room service appropriate? Yes; Fluid restriction:  1800 mL Fluid   Subjective 11/19/21    Pt reports still not feeling well but maybe a little improved.    Objective   Vitals:   11/18/21 2353 11/19/21 0055 11/19/21 0100 11/19/21 0445  BP: (!) 116/57 (!) 178/59 (!) 174/22 (!) 149/34  Pulse: 88 78 78 78  Resp: (!) 21 16  18   Temp:  98 F (36.7 C)  98.4 F (36.9 C)  TempSrc:      SpO2: 97%  98% 98% 94%  Weight:      Height:        Intake/Output Summary (Last 24 hours) at 11/19/2021 0725 Last data filed at 11/19/2021 0500 Gross per 24 hour  Intake 294.81 ml  Output --  Net 294.81 ml    Filed Weights   11/12/21 0554  Weight: 77.1 kg     Physical Exam:  General: awake, alert, NAD. Ill-appearing but improved HEENT: atraumatic, clear conjunctiva, anicteric sclera, MMM, hearing grossly normal Respiratory: normal respiratory effort. CTAB Cardiovascular: normal S1/S2, RRR, no JVD, murmurs, quick capillary refill  Gastrointestinal: soft, non tender to epigastric area Nervous: A&O x3. no gross focal neurologic deficits, normal speech Extremities: moves all equally, generalized edema, normal tone Skin: dry, intact, normal temperature, normal color. No rashes, lesions or ulcers on exposed skin Psychiatry: normal mood, congruent affect  Labs   I have personally reviewed the following labs and imaging studies CBC    Component Value Date/Time   WBC 8.3 11/19/2021 0643   RBC 4.15 11/19/2021 0643   HGB 12.4 11/19/2021 0643   HGB 13.1 10/11/2013 0441   HCT 35.2 (L) 11/19/2021 0643   HCT 39.9 10/11/2013 0441   PLT 180 11/19/2021 0643   PLT 247 10/11/2013 0441   MCV 84.8 11/19/2021 0643   MCV 92 10/11/2013 0441   MCH 29.9 11/19/2021 0643   MCHC 35.2 11/19/2021 0643   RDW 12.3 11/19/2021 0643   RDW 14.1 10/11/2013 0441   LYMPHSABS 1.3 11/19/2021 0643   LYMPHSABS 1.0 10/11/2013 0441   MONOABS 1.0 11/19/2021 0643   MONOABS 0.3 10/11/2013 0441   EOSABS 0.0 11/19/2021 0643   EOSABS 0.0 10/11/2013 0441   BASOSABS 0.0 11/19/2021 0643   BASOSABS 0.0 10/11/2013 0441   BMP Latest Ref Rng & Units 11/18/2021 11/18/2021 11/18/2021  Glucose 70 - 99 mg/dL 89 104(H) 177(H)  BUN 8 - 23 mg/dL 21 20 20   Creatinine 0.44 - 1.00 mg/dL 0.97 0.99 0.91  Sodium 135 - 145 mmol/L 117(LL) 117(LL) 126(L)  Potassium 3.5 - 5.1 mmol/L 4.2 3.6 4.0  Chloride 98 - 111 mmol/L 87(L) 87(L) 91(L)  CO2 22 -  32 mmol/L 23 25 26   Calcium 8.9 - 10.3 mg/dL 8.8(L) 8.5(L) 9.0    CT HEAD WO CONTRAST (5MM)  Result Date: 11/18/2021 CLINICAL DATA:  Altered mental status EXAM: CT HEAD WITHOUT CONTRAST TECHNIQUE: Contiguous axial images were obtained from the base of the skull through the vertex without intravenous contrast. RADIATION DOSE REDUCTION: This exam was performed according to the departmental dose-optimization program which includes automated exposure control, adjustment of the mA and/or kV according to patient size and/or use of iterative reconstruction technique. COMPARISON:  11/12/2021 FINDINGS: Brain: No evidence of acute infarction, hemorrhage, hydrocephalus, extra-axial collection or mass lesion/mass effect. Mild atrophic changes are noted. Vascular: No hyperdense vessel or unexpected calcification. Skull: Normal. Negative for fracture or focal lesion. Sinuses/Orbits: Air-fluid level is noted within the left maxillary antrum of a chronic nature stable in appearance from the prior exam.  Other: None IMPRESSION: Chronic atrophic changes without acute abnormality. 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 Electronically Signed   By: Inez Catalina M.D.   On: 11/18/2021 20:01   DG Chest Port 1 View  Result Date: 11/19/2021 CLINICAL DATA:  Hypoxia EXAM: PORTABLE CHEST 1 VIEW COMPARISON:  11/12/2021 FINDINGS: Cardiac shadow is mildly enlarged but stable. Aortic calcifications are seen. The lungs are well aerated bilaterally. Mild vascular congestion is noted without significant edema. No focal infiltrate is noted. No bony abnormality is seen. IMPRESSION: Mild vascular congestion without significant edema. Electronically Signed   By: Inez Catalina M.D.   On: 11/19/2021 00:46    Disposition Plan & Communication  Patient status: Inpatient  Admitted From: Home Planned disposition location: Home health recommended (patient refusing home health) Anticipated discharge date: 3/8 pending improvement in Na+, oxygen  wean, clinical impovement  Family Communication: none    Author: Richarda Osmond, DO [facility] 11/19/2021, 7:25 AM   Available by Epic secure chat 7AM-7PM. If 7PM-7AM, please contact night-coverage.  [facility] contact information found on [email or web address].

## 2021-11-19 NOTE — Plan of Care (Signed)
Pt is not oriented alert x 1. Na dropped to 117 during overnight. Lasix given x 2 and mag given. Hypoglycemic event. D50 given. Blood sugars changed to every 4 hours. Pt was lethargic at beginning of shift and had to be given Romaxicon for ativan that was given for CT scan.  ?Problem: Education: ?Goal: Knowledge of disease or condition will improve ?11/19/2021 0655 by Moishe Spice, RN ?Outcome: Not Progressing ?11/19/2021 0543 by Moishe Spice, RN ?Outcome: Progressing ?Goal: Knowledge of the prescribed therapeutic regimen will improve ?11/19/2021 0655 by Moishe Spice, RN ?Outcome: Not Progressing ?11/19/2021 0543 by Moishe Spice, RN ?Outcome: Progressing ?Goal: Individualized Educational Video(s) ?11/19/2021 0655 by Moishe Spice, RN ?Outcome: Not Progressing ?11/19/2021 0543 by Moishe Spice, RN ?Outcome: Progressing ?  ?Problem: Activity: ?Goal: Will verbalize the importance of balancing activity with adequate rest periods ?11/19/2021 0655 by Moishe Spice, RN ?Outcome: Not Progressing ?11/19/2021 0543 by Moishe Spice, RN ?Outcome: Progressing ?  ?

## 2021-11-19 NOTE — Progress Notes (Signed)
[facility]  ROUNDING NOTE   Subjective:   Patient resting quietly Safety sitter at bedside According to sitter, pt able to ambulate to restroom Tolerating small meals  Sodium 123  Objective:  Vital signs in last 24 hours:  Temp:  [98 F (36.7 C)-98.4 F (36.9 C)] 98.4 F (36.9 C) (03/05 0445) Pulse Rate:  [55-88] 88 (03/05 0816) Resp:  [14-21] 20 (03/05 0816) BP: (90-178)/(22-104) 141/61 (03/05 0845) SpO2:  [83 %-99 %] 99 % (03/05 0816)  Weight change:  Filed Weights   11/12/21 0554  Weight: 77.1 kg    Intake/Output: I/O last 3 completed shifts: In: 294.8 [IV Piggyback:294.8] Out: -    Intake/Output this shift:  Total I/O In: -  Out: 450 [Urine:450]  Physical Exam: General: NAD  Head: Normocephalic, atraumatic. Moist oral mucosal membranes  Eyes: Anicteric  Lungs:  Clear to auscultation, normal effort  Heart: Regular rate and rhythm  Abdomen:  Soft, nontender  Extremities:  1+ peripheral edema.Right upper extremity edematous  Neurologic: Nonfocal, moving all four extremities  Skin: No lesions       Basic Metabolic Panel: Recent Labs  Lab 11/13/21 0433 11/14/21 0530 11/17/21 0458 11/18/21 0500 11/18/21 1925 11/18/21 2330 11/19/21 0643 11/19/21 0844  NA 129*   < > 127* 126* 117* 117* 122* 123*  K 3.5   < > 4.8 4.0 3.6 4.2 3.8  --   CL 94*   < > 96* 91* 87* 87* 86*  --   CO2 25   < > 26 26 25 23 26   --   GLUCOSE 199*   < > 204* 177* 104* 89 103*  --   BUN 8   < > 24* 20 20 21 22   --   CREATININE 0.71   < > 1.07* 0.91 0.99 0.97 1.05*  --   CALCIUM 8.0*   < > 8.8* 9.0 8.5* 8.8* 9.0  --   MG 1.8  --   --   --   --  1.5*  --   --   PHOS  --   --   --  1.7*  --  2.2* 2.3*  --    < > = values in this interval not displayed.     Liver Function Tests: Recent Labs  Lab 11/17/21 0458 11/18/21 0500 11/19/21 0643  AST 44*  --  69*  ALT 40  --  50*  ALKPHOS 66  --  65  BILITOT 1.1  --  1.7*  PROT 5.7*  --  5.9*  ALBUMIN 2.8* 3.3*  3.5    No results for input(s): LIPASE, AMYLASE in the last 168 hours.  No results for input(s): AMMONIA in the last 168 hours.  CBC: Recent Labs  Lab 11/13/21 0433 11/14/21 0530 11/15/21 0614 11/18/21 0500 11/19/21 0643  WBC 1.4* 7.1 10.9* 9.8 8.3  NEUTROABS  --  6.3 10.0*  --  5.7  HGB 13.3 13.0 13.4 13.0 12.4  HCT 38.2 38.2 39.2 38.2 35.2*  MCV 85.7 87.4 87.5 87.2 84.8  PLT 99* 141* 192 210 180     Cardiac Enzymes: No results for input(s): CKTOTAL, CKMB, CKMBINDEX, TROPONINI in the last 168 hours.  BNP: Invalid input(s): POCBNP  CBG: Recent Labs  Lab 11/18/21 2204 11/18/21 2229 11/18/21 2357 11/19/21 0424 11/19/21 0817  GLUCAP 112* 100* 92 107* 105*     Microbiology: Results for orders placed or performed during the hospital encounter of 11/12/21  Resp Panel by RT-PCR (Flu A&B,  Covid) Nasopharyngeal Swab     Status: Abnormal   Collection Time: 11/12/21  8:19 AM   Specimen: Nasopharyngeal Swab; Nasopharyngeal(NP) swabs in vial transport medium  Result Value Ref Range Status   SARS Coronavirus 2 by RT PCR POSITIVE (A) NEGATIVE Final    Comment: (NOTE) SARS-CoV-2 target nucleic acids are DETECTED.  The SARS-CoV-2 RNA is generally detectable in upper respiratory specimens during the acute phase of infection. Positive results are indicative of the presence of the identified virus, but do not rule out bacterial infection or co-infection with other pathogens not detected by the test. Clinical correlation with patient history and other diagnostic information is necessary to determine patient infection status. The expected result is Negative.  Fact Sheet for Patients: BloggerCourse.com  Fact Sheet for Healthcare Providers: SeriousBroker.it  This test is not yet approved or cleared by the [place] FDA and  has been authorized for detection and/or diagnosis of SARS-CoV-2 by FDA under an Emergency Use  Authorization (EUA).  This EUA will remain in effect (meaning this test can be used) for the duration of  the COVID-19 declaration under Section 564(b)(1) of the A ct, 21 U.S.C. section 360bbb-3(b)(1), unless the authorization is terminated or revoked sooner.     Influenza A by PCR NEGATIVE NEGATIVE Final   Influenza B by PCR NEGATIVE NEGATIVE Final    Comment: (NOTE) The Xpert Xpress SARS-CoV-2/FLU/RSV plus assay is intended as an aid in the diagnosis of influenza from Nasopharyngeal swab specimens and should not be used as a sole basis for treatment. Nasal washings and aspirates are unacceptable for Xpert Xpress SARS-CoV-2/FLU/RSV testing.  Fact Sheet for Patients: BloggerCourse.com  Fact Sheet for Healthcare Providers: SeriousBroker.it  This test is not yet approved or cleared by the [place] FDA and has been authorized for detection and/or diagnosis of SARS-CoV-2 by FDA under an Emergency Use Authorization (EUA). This EUA will remain in effect (meaning this test can be used) for the duration of the COVID-19 declaration under Section 564(b)(1) of the Act, 21 U.S.C. section 360bbb-3(b)(1), unless the authorization is terminated or revoked.  Performed at [facility], [address] [place], [place] [address]   Blood culture (single)     Status: None   Collection Time: 11/12/21  8:19 AM   Specimen: BLOOD  Result Value Ref Range Status   Specimen Description BLOOD RIGHT ANTECUBITAL  Final   Special Requests   Final    BOTTLES DRAWN AEROBIC AND ANAEROBIC Blood Culture results may not be optimal due to an excessive volume of blood received in culture bottles   Culture   Final    NO GROWTH 5 DAYS Performed at [facility], [address] [place], [place] [address]    Report Status 11/17/2021 FINAL  Final  Expectorated Sputum Assessment w Gram Stain, Rflx to Resp Cult     Status: None   Collection  Time: 11/17/21  7:15 AM   Specimen: Sputum  Result Value Ref Range Status   Specimen Description SPUTUM  Final   Special Requests NONE  Final   Sputum evaluation   Final    THIS SPECIMEN IS ACCEPTABLE FOR SPUTUM CULTURE Performed at [facility], [address] [place], [place] [address]    Report Status 11/17/2021 FINAL  Final  Culture, Respiratory w Gram Stain     Status: None (Preliminary result)   Collection Time: 11/17/21  7:15 AM   Specimen: SPU  Result Value Ref Range Status   Specimen Description  Final    SPUTUM Performed at [facility], [address] [place], [place] [address]    Special Requests   Final    NONE Reflexed from [id number] Performed at [facility], [address] [place], [place] [address]    Gram Stain   Final    FEW WBC PRESENT,BOTH PMN AND MONONUCLEAR FEW GRAM POSITIVE COCCI IN PAIRS    Culture   Final    ABUNDANT Consistent with normal respiratory flora. No Pseudomonas species isolated Performed at [facility], [address] [place], [place] [address]    Report Status PENDING  Incomplete    Coagulation Studies: No results for input(s): LABPROT, INR in the last 72 hours.  Urinalysis: No results for input(s): COLORURINE, LABSPEC, PHURINE, GLUCOSEU, HGBUR, BILIRUBINUR, KETONESUR, PROTEINUR, UROBILINOGEN, NITRITE, LEUKOCYTESUR in the last 72 hours.  Invalid input(s): APPERANCEUR    Imaging: CT HEAD WO CONTRAST ( )  Result Date: 11/18/2021 CLINICAL DATA:  Altered mental status EXAM: CT HEAD WITHOUT CONTRAST TECHNIQUE: Contiguous axial images were obtained from the base of the skull through the vertex without intravenous contrast. RADIATION DOSE REDUCTION: This exam was performed according to the departmental dose-optimization program which includes automated exposure control, adjustment of the mA and/or kV according to patient size and/or use of iterative reconstruction technique. COMPARISON:  11/12/2021  FINDINGS: Brain: No evidence of acute infarction, hemorrhage, hydrocephalus, extra-axial collection or mass lesion/mass effect. Mild atrophic changes are noted. Vascular: No hyperdense vessel or unexpected calcification. Skull: Normal. Negative for fracture or focal lesion. Sinuses/Orbits: Air-fluid level is noted within the left maxillary antrum of a chronic nature stable in appearance from the prior exam. Other: None IMPRESSION: Chronic atrophic changes without acute abnormality. 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 Electronically Signed   By: Alcide Clever M.D.   On: 11/18/2021 20:01   DG Chest Port 1 View  Result Date: 11/19/2021 CLINICAL DATA:  Hypoxia EXAM: PORTABLE CHEST 1 VIEW COMPARISON:  11/12/2021 FINDINGS: Cardiac shadow is mildly enlarged but stable. Aortic calcifications are seen. The lungs are well aerated bilaterally. Mild vascular congestion is noted without significant edema. No focal infiltrate is noted. No bony abnormality is seen. IMPRESSION: Mild vascular congestion without significant edema. Electronically Signed   By: Alcide Clever M.D.   On: 11/19/2021 00:46     Medications:    albumin human 12.5 g (11/19/21 0852)    amLODipine  10 mg Oral Daily   aspirin EC  81 mg Oral Daily   atenolol  25 mg Oral Daily   Chlorhexidine Gluconate Cloth  6 each Topical Daily   enoxaparin (LOVENOX) injection  40 mg Subcutaneous Q24H   fluticasone  2 puff Inhalation BID   furosemide  10 mg Oral BID   guaiFENesin  600 mg Oral BID   insulin aspart  0-9 Units Subcutaneous TID WC   insulin aspart  3 Units Subcutaneous TID WC   lisinopril  20 mg Oral Daily   mouth rinse  15 mL Mouth Rinse BID   methimazole  5 mg Oral Daily   multivitamin with minerals  1 tablet Oral Daily   nystatin  5 mL Oral BID   pantoprazole  40 mg Oral Daily   predniSONE  40 mg Oral Q breakfast   acetaminophen, ipratropium, ondansetron (ZOFRAN) IV, prochlorperazine  Assessment/ Plan:  Ms. NIANA EKE  is a 81 y.o.  female  with medical conditions including hypertension, diabetes, hyperlipidemia, asthma, superficial thrombophlebitis, who was admitted to [facility]  on 11/12/2021 for Acute bronchitis [J20.9] Hypokalemia [E87.6] Hyponatremia [E87.1] Bronchitis [J40] COPD exacerbation (HCC) [J44.1]   Hyponatremia with baseline 129-131.  Treated with fluid restriction and IV fluids with initial improvement to 129. Patient hyponatremia is secondary to her pulmonary and liver issues. 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   Sodium levels decreased to 117 last evening, was given 2 additional doses of Furosemide 20mg  IV. Sodium corrected to 123 this morning.  Will continue Albumin and Furosemide twice daily along with fluid restriction.   2.  Hypertension.   Patient blood pressure on the higher side Patient is on amlodipine, lisinopril and atenolol Patient hydrochlorothiazide is currently held BP remains elevated at times, may consider Losartan as alternative.     3.  Type 2 diabetes mellitus.  Noninsulin-dependent.  Home medications include glipizide and Tradjenta.  Primary team to manage SSI  Glucose stable   4.Acute kidney injury Patient had AKI and patient creatinine had peaked at 1.07 Patient baseline creatinine is 0.6--0.7 Patient AKI is improving .  5.Hypophosphatemia Patient phosphorus is low This is secondary to poor p.o. intake Will encourage oral intake    6. Acute hypoxic respiratory failure due to COVID-19 Primary team is following    LOS: 7 Kendra Clarke 3/5/202310:43 AM

## 2021-11-19 NOTE — Progress Notes (Signed)
Date and time results received: 11/19/21 0015 ?(use smartphrase ".now" to insert current time) ? ?Test: Sodium ?Critical Value: 117 ? ?Name of Provider Notified: Cliffton Asters NP ? ?Orders Received? Or Actions Taken?: Actions Taken: Mag ordered and follow up labs  ?

## 2021-11-20 DIAGNOSIS — U071 COVID-19: Secondary | ICD-10-CM | POA: Diagnosis not present

## 2021-11-20 DIAGNOSIS — R0902 Hypoxemia: Secondary | ICD-10-CM

## 2021-11-20 DIAGNOSIS — J441 Chronic obstructive pulmonary disease with (acute) exacerbation: Secondary | ICD-10-CM | POA: Diagnosis not present

## 2021-11-20 DIAGNOSIS — E876 Hypokalemia: Secondary | ICD-10-CM | POA: Diagnosis not present

## 2021-11-20 DIAGNOSIS — J40 Bronchitis, not specified as acute or chronic: Secondary | ICD-10-CM | POA: Diagnosis not present

## 2021-11-20 LAB — GLUCOSE, CAPILLARY
Glucose-Capillary: 134 mg/dL — ABNORMAL HIGH (ref 70–99)
Glucose-Capillary: 145 mg/dL — ABNORMAL HIGH (ref 70–99)
Glucose-Capillary: 148 mg/dL — ABNORMAL HIGH (ref 70–99)
Glucose-Capillary: 175 mg/dL — ABNORMAL HIGH (ref 70–99)
Glucose-Capillary: 236 mg/dL — ABNORMAL HIGH (ref 70–99)
Glucose-Capillary: 370 mg/dL — ABNORMAL HIGH (ref 70–99)

## 2021-11-20 LAB — RENAL FUNCTION PANEL
Albumin: 2.9 g/dL — ABNORMAL LOW (ref 3.5–5.0)
Anion gap: 9 (ref 5–15)
BUN: 21 mg/dL (ref 8–23)
CO2: 28 mmol/L (ref 22–32)
Calcium: 8.7 mg/dL — ABNORMAL LOW (ref 8.9–10.3)
Chloride: 92 mmol/L — ABNORMAL LOW (ref 98–111)
Creatinine, Ser: 0.94 mg/dL (ref 0.44–1.00)
GFR, Estimated: >60 mL/min (ref >60)
Glucose, Bld: 146 mg/dL — ABNORMAL HIGH (ref 70–99)
Phosphorus: 3.2 mg/dL (ref 2.5–4.6)
Potassium: 3.3 mmol/L — ABNORMAL LOW (ref 3.5–5.1)
Sodium: 129 mmol/L — ABNORMAL LOW (ref 135–145)

## 2021-11-20 LAB — URINALYSIS, ROUTINE W REFLEX MICROSCOPIC
Bilirubin Urine: NEGATIVE
Glucose, UA: 50 mg/dL — AB
Ketones, ur: NEGATIVE mg/dL
Nitrite: NEGATIVE
Protein, ur: 30 mg/dL — AB
RBC / HPF: >50 RBC/hpf — ABNORMAL HIGH (ref 0–5)
Specific Gravity, Urine: 1.014 (ref 1.005–1.030)
WBC, UA: >50 WBC/hpf — ABNORMAL HIGH (ref 0–5)
pH: 5 (ref 5.0–8.0)

## 2021-11-20 LAB — CULTURE, RESPIRATORY W GRAM STAIN: Culture: NORMAL

## 2021-11-20 LAB — SODIUM: Sodium: 128 mmol/L — ABNORMAL LOW (ref 135–145)

## 2021-11-20 NOTE — Progress Notes (Signed)
Physical Therapy Treatment ?Patient Details ?Name: Kendra Clarke ?MRN: [id number] ?DOB: 1941-06-03 ?Today's Date: 11/20/2021 ? ? ?History of Present Illness Pt is an 81 y.o. female presenting to hospital 2/26 with c/o SOB, cough, fatigue, increasing weakness, nausea, difficulty tolerating p.o., diarrhea, and significant lightheadedness with standing causing her to fall several times.  Pt admitted with acute respiratory disease and sepsis d/t COVID-19 virus, COPD exacerbation, leukopenia, and thrombocyptopenia.  PMH includes sepsis, DM, htn, HLD, DM, COPD, large ventral hernia, and superficial thrombophlebitis. ? ?  ?PT Comments  ? ? Pt received upright in bed agreeable to PT services. Pt appearing lethargic. Able to transfer to EoB with supervision but requires modA at chuck pad for LE's to reach floor and last effort to scoot to EOB. Pt requiring mod to max multimodal cues for safe hand placement to stand to RW requiring minA to attain safe standing. Pt tolerating ~20' of ambulation in room with x1 bout of multimodal cuing to navigate around hospital bed as pt trying to utilize RW in unsafe manner around bed turning it side ways. Overall tolerance for OOB mobility limited as pt reports dizziness so pt seated in recliner. Reports symptoms improvement with seated rest. All needs in reach. Plan to change D/c recs to SNF as pt has steady decline in mobility from supervision and no AD safe household distances (~75') now requiring physical assist for bed mobility, requiring AD, and limited ambulation distances to ~20'. ?  ?Recommendations for follow up therapy are one component of a multi-disciplinary discharge planning process, led by the attending physician.  Recommendations may be updated based on patient status, additional functional criteria and insurance authorization. ? ?Follow Up Recommendations ? Skilled nursing-short term rehab (<3 hours/day) ?  ?  ?Assistance Recommended at Discharge PRN  ?Patient can return  home with the following A little help with walking and/or transfers;Assistance with cooking/housework;Assist for transportation;Help with stairs or ramp for entrance ?  ?Equipment Recommendations ? None recommended by PT  ?  ?Recommendations for Other Services   ? ? ?  ?Precautions / Restrictions Precautions ?Precautions: Fall ?Restrictions ?Weight Bearing Restrictions: No  ?  ? ?Mobility ? Bed Mobility ?Overal bed mobility: Needs Assistance ?Bed Mobility: Supine to Sit ?  ?  ?Supine to sit: Supervision, HOB elevated ?  ?  ?General bed mobility comments: Required modA from PT to scoot up at EOB so feet can reach floor. Increased time for pt to perform. ?Patient Response: Cooperative ? ?Transfers ?Overall transfer level: Needs assistance ?  ?Transfers: Sit to/from Stand ?Sit to Stand: Min assist ?  ?  ?  ?  ?  ?General transfer comment: VC's for hand placement. ?  ? ?Ambulation/Gait ?Ambulation/Gait assistance: Min guard ?Gait Distance (Feet): 20 Feet ?Assistive device: Rolling walker (2 wheels) ?Gait Pattern/deviations: Step-through pattern, Trunk flexed ?Gait velocity: decreased ?  ?  ?  ? ? ?Stairs ?  ?  ?  ?  ?  ? ? ?Wheelchair Mobility ?  ? ?Modified Rankin (Stroke Patients Only) ?  ? ? ?  ?Balance Overall balance assessment: Needs assistance ?Sitting-balance support: No upper extremity supported, Feet supported ?Sitting balance-Leahy Scale: Good ?  ?  ?Standing balance support: Bilateral upper extremity supported, During functional activity ?Standing balance-Leahy Scale: Fair ?Standing balance comment: Reliant on RW ?  ?  ?  ?  ?  ?  ?  ?  ?  ?  ?  ?  ? ?  ?Cognition Arousal/Alertness: Awake/alert, Lethargic ?Behavior During Therapy: [facility]  for tasks assessed/performed ?Overall Cognitive Status: Within Functional Limits for tasks assessed ?  ?  ?  ?  ?  ?  ?  ?  ?  ?  ?  ?  ?  ?  ?  ?  ?  ?  ?  ? ?  ?Exercises General Exercises - Lower Extremity ?Ankle Circles/Pumps: AROM, Strengthening, Both, 20 reps,  Supine ?Long Arc Quad: AROM, Strengthening, Both, Seated, 10 reps ?Hip Flexion/Marching: AROM, Strengthening, Seated, Both, 10 reps ? ?  ?General Comments   ?  ?  ? ?Pertinent Vitals/Pain Pain Assessment ?Pain Assessment: No/denies pain  ? ? ?Home Living   ?  ?  ?  ?  ?  ?  ?  ?  ?  ?   ?  ?Prior Function    ?  ?  ?   ? ?PT Goals (current goals can now be found in the care plan section) Acute Rehab PT Goals ?Patient Stated Goal: get better so I can get home ?PT Goal Formulation: With patient ?Time For Goal Achievement: 11/27/21 ?Potential to Achieve Goals: Fair ?Progress towards PT goals: Not progressing toward goals - comment (pt requiring assist with bed mobility, not tolerating ambulation well for household distances) ? ?  ?Frequency ? ? ? Min 2X/week ? ? ? ?  ?PT Plan Discharge plan needs to be updated  ? ? ?Co-evaluation   ?  ?  ?  ?  ? ?  ?AM-PAC PT "6 Clicks" Mobility   ?Outcome Measure ? Help needed turning from your back to your side while in a flat bed without using bedrails?: A Little ?Help needed moving from lying on your back to sitting on the side of a flat bed without using bedrails?: A Little ?Help needed moving to and from a bed to a chair (including a wheelchair)?: A Little ?Help needed standing up from a chair using your arms (e.g., wheelchair or bedside chair)?: A Lot ?Help needed to walk in hospital room?: A Little ?Help needed climbing 3-5 steps with a railing? : A Lot ?6 Click Score: 16 ? ?  ?End of Session Equipment Utilized During Treatment: Gait belt;Oxygen ?Activity Tolerance: Patient limited by fatigue ?Patient left: in chair;with call bell/phone within reach;with chair alarm set ?Nurse Communication: Mobility status ?PT Visit Diagnosis: Unsteadiness on feet (R26.81);Muscle weakness (generalized) (M62.81);History of falling (Z91.81) ?  ? ? ?Time: 7619-5093 ?PT Time Calculation (min) (ACUTE ONLY): 21 min ? ?Charges:  $Therapeutic Exercise: 8-22 mins          ?          ?Delphia Grates. Fairly  IV, PT, DPT ?Physical Therapist- [place]  ?[facility]  ?11/20/2021, 12:10 PM ? ?

## 2021-11-20 NOTE — Progress Notes (Signed)
[facility]  ROUNDING NOTE   Subjective:   Patient seen sitting in chair Alert and oriented Poor appetite due to lack of dentures  Sodium 129   Objective:  Vital signs in last 24 hours:  Temp:  [98 F (36.7 C)-99.4 F (37.4 C)] 98.5 F (36.9 C) (03/06 1201) Pulse Rate:  [68-81] 68 (03/06 1201) Resp:  [15-22] 15 (03/06 1201) BP: (115-142)/(45-68) 120/53 (03/06 1201) SpO2:  [95 %-99 %] 97 % (03/06 1201)  Weight change:  Filed Weights   11/12/21 0554  Weight: 77.1 kg    Intake/Output: I/O last 3 completed shifts: In: 392.4 [IV Piggyback:392.4] Out: 2825 [Urine:2825]   Intake/Output this shift:  No intake/output data recorded.  Physical Exam: General: NAD  Head: Normocephalic, atraumatic. Moist oral mucosal membranes  Eyes: Anicteric  Lungs:  Clear to auscultation, normal effort  Heart: Regular rate and rhythm  Abdomen:  Soft, nontender  Extremities:  trace peripheral edema.Right upper extremity edematous  Neurologic: Nonfocal, moving all four extremities  Skin: No lesions       Basic Metabolic Panel: Recent Labs  Lab 11/18/21 0500 11/18/21 1925 11/18/21 2330 11/19/21 0643 11/19/21 0844 11/19/21 1304 11/19/21 1743 11/19/21 2054 11/20/21 0030 11/20/21 0518  NA 126* 117* 117* 122*   < > 124* 127* 128* 128* 129*  K 4.0 3.6 4.2 3.8  --   --   --   --   --  3.3*  CL 91* 87* 87* 86*  --   --   --   --   --  92*  CO2 26 25 23 26   --   --   --   --   --  28  GLUCOSE 177* 104* 89 103*  --   --   --   --   --  146*  BUN 20 20 21 22   --   --   --   --   --  21  CREATININE 0.91 0.99 0.97 1.05*  --   --   --   --   --  0.94  CALCIUM 9.0 8.5* 8.8* 9.0  --   --   --   --   --  8.7*  MG  --   --  1.5*  --   --   --   --   --   --   --   PHOS 1.7*  --  2.2* 2.3*  --   --   --   --   --  3.2   < > = values in this interval not displayed.     Liver Function Tests: Recent Labs  Lab 11/17/21 0458 11/18/21 0500 11/19/21 0643 11/20/21 0518  AST  44*  --  69*  --   ALT 40  --  50*  --   ALKPHOS 66  --  65  --   BILITOT 1.1  --  1.7*  --   PROT 5.7*  --  5.9*  --   ALBUMIN 2.8* 3.3* 3.5 2.9*    No results for input(s): LIPASE, AMYLASE in the last 168 hours.  No results for input(s): AMMONIA in the last 168 hours.  CBC: Recent Labs  Lab 11/14/21 0530 11/15/21 0614 11/18/21 0500 11/19/21 0643  WBC 7.1 10.9* 9.8 8.3  NEUTROABS 6.3 10.0*  --  5.7  HGB 13.0 13.4 13.0 12.4  HCT 38.2 39.2 38.2 35.2*  MCV 87.4 87.5 87.2 84.8  PLT 141* 192 210 180  Cardiac Enzymes: No results for input(s): CKTOTAL, CKMB, CKMBINDEX, TROPONINI in the last 168 hours.  BNP: Invalid input(s): POCBNP  CBG: Recent Labs  Lab 11/19/21 2046 11/20/21 0011 11/20/21 0430 11/20/21 0803 11/20/21 1202  GLUCAP 131* 148* 134* 145* 370*     Microbiology: Results for orders placed or performed during the hospital encounter of 11/12/21  Resp Panel by RT-PCR (Flu A&B, Covid) Nasopharyngeal Swab     Status: Abnormal   Collection Time: 11/12/21  8:19 AM   Specimen: Nasopharyngeal Swab; Nasopharyngeal(NP) swabs in vial transport medium  Result Value Ref Range Status   SARS Coronavirus 2 by RT PCR POSITIVE (A) NEGATIVE Final    Comment: (NOTE) SARS-CoV-2 target nucleic acids are DETECTED.  The SARS-CoV-2 RNA is generally detectable in upper respiratory specimens during the acute phase of infection. Positive results are indicative of the presence of the identified virus, but do not rule out bacterial infection or co-infection with other pathogens not detected by the test. Clinical correlation with patient history and other diagnostic information is necessary to determine patient infection status. The expected result is Negative.  Fact Sheet for Patients: BloggerCourse.com  Fact Sheet for Healthcare Providers: SeriousBroker.it  This test is not yet approved or cleared by the [place] FDA  and  has been authorized for detection and/or diagnosis of SARS-CoV-2 by FDA under an Emergency Use Authorization (EUA).  This EUA will remain in effect (meaning this test can be used) for the duration of  the COVID-19 declaration under Section 564(b)(1) of the A ct, 21 U.S.C. section 360bbb-3(b)(1), unless the authorization is terminated or revoked sooner.     Influenza A by PCR NEGATIVE NEGATIVE Final   Influenza B by PCR NEGATIVE NEGATIVE Final    Comment: (NOTE) The Xpert Xpress SARS-CoV-2/FLU/RSV plus assay is intended as an aid in the diagnosis of influenza from Nasopharyngeal swab specimens and should not be used as a sole basis for treatment. Nasal washings and aspirates are unacceptable for Xpert Xpress SARS-CoV-2/FLU/RSV testing.  Fact Sheet for Patients: BloggerCourse.com  Fact Sheet for Healthcare Providers: SeriousBroker.it  This test is not yet approved or cleared by the [place] FDA and has been authorized for detection and/or diagnosis of SARS-CoV-2 by FDA under an Emergency Use Authorization (EUA). This EUA will remain in effect (meaning this test can be used) for the duration of the COVID-19 declaration under Section 564(b)(1) of the Act, 21 U.S.C. section 360bbb-3(b)(1), unless the authorization is terminated or revoked.  Performed at [facility], [address] [place], [place] [address]   Blood culture (single)     Status: None   Collection Time: 11/12/21  8:19 AM   Specimen: BLOOD  Result Value Ref Range Status   Specimen Description BLOOD RIGHT ANTECUBITAL  Final   Special Requests   Final    BOTTLES DRAWN AEROBIC AND ANAEROBIC Blood Culture results may not be optimal due to an excessive volume of blood received in culture bottles   Culture   Final    NO GROWTH 5 DAYS Performed at [facility], [address], [place], [place] [address]    Report Status 11/17/2021 FINAL   Final  Expectorated Sputum Assessment w Gram Stain, Rflx to Resp Cult     Status: None   Collection Time: 11/17/21  7:15 AM   Specimen: Sputum  Result Value Ref Range Status   Specimen Description SPUTUM  Final   Special Requests NONE  Final   Sputum evaluation   Final  THIS SPECIMEN IS ACCEPTABLE FOR SPUTUM CULTURE Performed at [facility], [address] [place], [place] [address]    Report Status 11/17/2021 FINAL  Final  Culture, Respiratory w Gram Stain     Status: None   Collection Time: 11/17/21  7:15 AM   Specimen: SPU  Result Value Ref Range Status   Specimen Description   Final    SPUTUM Performed at [facility], [address] [place], [place] [address]    Special Requests   Final    NONE Reflexed from [id number] Performed at [facility], [address] [place], [place] [address]    Gram Stain   Final    FEW WBC PRESENT,BOTH PMN AND MONONUCLEAR FEW GRAM POSITIVE COCCI IN PAIRS    Culture   Final    ABUNDANT Consistent with normal respiratory flora. No Pseudomonas species isolated Performed at [facility], [address], [place], [place] [address]    Report Status 11/20/2021 FINAL  Final    Coagulation Studies: No results for input(s): LABPROT, INR in the last 72 hours.  Urinalysis: No results for input(s): COLORURINE, LABSPEC, PHURINE, GLUCOSEU, HGBUR, BILIRUBINUR, KETONESUR, PROTEINUR, UROBILINOGEN, NITRITE, LEUKOCYTESUR in the last 72 hours.  Invalid input(s): APPERANCEUR    Imaging: CT HEAD WO CONTRAST ( )  Result Date: 11/18/2021 CLINICAL DATA:  Altered mental status EXAM: CT HEAD WITHOUT CONTRAST TECHNIQUE: Contiguous axial images were obtained from the base of the skull through the vertex without intravenous contrast. RADIATION DOSE REDUCTION: This exam was performed according to the departmental dose-optimization program which includes automated exposure control, adjustment of the mA and/or kV according to  patient size and/or use of iterative reconstruction technique. COMPARISON:  11/12/2021 FINDINGS: Brain: No evidence of acute infarction, hemorrhage, hydrocephalus, extra-axial collection or mass lesion/mass effect. Mild atrophic changes are noted. Vascular: No hyperdense vessel or unexpected calcification. Skull: Normal. Negative for fracture or focal lesion. Sinuses/Orbits: Air-fluid level is noted within the left maxillary antrum of a chronic nature stable in appearance from the prior exam. Other: None IMPRESSION: Chronic atrophic changes without acute abnormality. 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 Electronically Signed   By: Alcide Clever M.D.   On: 11/18/2021 20:01   DG Chest Port 1 View  Result Date: 11/19/2021 CLINICAL DATA:  Hypoxia EXAM: PORTABLE CHEST 1 VIEW COMPARISON:  11/12/2021 FINDINGS: Cardiac shadow is mildly enlarged but stable. Aortic calcifications are seen. The lungs are well aerated bilaterally. Mild vascular congestion is noted without significant edema. No focal infiltrate is noted. No bony abnormality is seen. IMPRESSION: Mild vascular congestion without significant edema. Electronically Signed   By: Alcide Clever M.D.   On: 11/19/2021 00:46     Medications:    albumin human 12.5 g (11/20/21 1045)    amLODipine  10 mg Oral Daily   aspirin EC  81 mg Oral Daily   atenolol  25 mg Oral Daily   Chlorhexidine Gluconate Cloth  6 each Topical Daily   enoxaparin (LOVENOX) injection  40 mg Subcutaneous Q24H   fluticasone  2 puff Inhalation BID   furosemide  10 mg Oral BID   guaiFENesin  600 mg Oral BID   insulin aspart  0-9 Units Subcutaneous TID WC   insulin aspart  3 Units Subcutaneous TID WC   lisinopril  20 mg Oral Daily   mouth rinse  15 mL Mouth Rinse BID   methimazole  5 mg Oral Daily   multivitamin with minerals  1 tablet Oral  Daily   nystatin  5 mL Oral BID   pantoprazole  40 mg Oral Daily   predniSONE  40 mg Oral Q breakfast   acetaminophen,  ipratropium, liver oil-zinc oxide, ondansetron (ZOFRAN) IV, prochlorperazine  Assessment/ Plan:  Ms. ABRIELLE GOLDAMMER is a 81 y.o.  female  with medical conditions including hypertension, diabetes, hyperlipidemia, asthma, superficial thrombophlebitis, who was admitted to [facility] on 11/12/2021 for Acute bronchitis [J20.9] Hypokalemia [E87.6] Hyponatremia [E87.1] Bronchitis [J40] COPD exacerbation (HCC) [J44.1]   Hyponatremia with baseline 129-131.  Treated with fluid restriction and IV fluids with initial improvement to 129. Patient hyponatremia is secondary to her pulmonary and liver issues. 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   Sodium levels continues to improve, 129. Adequate urine output of 2.8L.  Continue current treatments.   2.  Hypertension.   Patient blood pressure on the higher side Patient is on amlodipine, lisinopril and atenolol Patient hydrochlorothiazide is currently held  BP control gained, will continue to monitor     3.  Type 2 diabetes mellitus.  Noninsulin-dependent.  Home medications include glipizide and Tradjenta.  Primary team to manage SSI  Glucose elevated at lunch.     4.Acute kidney injury Patient had AKI and patient creatinine had peaked at 1.07 Patient baseline creatinine is 0.6--0.7 Renal function returned to baseline  5.Hypophosphatemia Patient phosphorus is low This is secondary to poor p.o. intake Will encourage oral intake   6. Acute hypoxic respiratory failure due to COVID-19 Primary team is following    LOS: 8 Cle[place]ne Savini 3/6/20231:34 PM

## 2021-11-20 NOTE — Progress Notes (Signed)
PROGRESS NOTE  Kendra Clarke    DOB: April 28, 1941, 81 y.o.  HEN:[id number]    Code Status: Full Code   DOA: 11/12/2021   LOS: 8   Brief hospital course  Kendra Clarke is a 81 y.o. female with a PMH significant for HTN, HLD, type II DM, COPD, asthma, varicose veins, ventral hernia, superficial thrombophlebitis. They presented from home to the ED on 11/12/2021 with cough, SOB, N/V/D x 7 days.  In the ED, it was found that they had COVID+, hyponatremia, bronchitis changes on chest xray. Had negative head CT  They were treated with paxlovid, steroids, IV fluids, and supportive care.  Patient was admitted to medicine service for further workup and management of acute respiratory disorder without hypoxia as outlined in detail below.  11/20/21 -stable, feeling tired  Assessment & Plan  Principal Problem:   Acute respiratory disease due to COVID-19 virus Active Problems:   Sepsis (HCC)   Bronchitis   HTN (hypertension)   HLD (hyperlipidemia)   COPD exacerbation (HCC)   Diabetes mellitus without complication (HCC)   Fall at home, initial encounter   Hyponatremia   Hypokalemia   Disorder of electrolytes   Elevated troponin   Nausea & vomiting   Hypomagnesemia   Abdominal pain   Edema  Sepsis - sepsis criteria have resolved.  Acute hypoxic respiratory failure 2/2 COVID-19   COPD exacerbation- She is on 2L this morning for comfort. Did not have desaturation. S/p paxlovid - continue steroids - supportive care PRN - pulse ox with ambulation - bronchodilators PRN, and scheduled - PT/OT- now recommending SNF. She will likely decline  Dysuria- foley in place. No obstruction.  - clean catch urinalysis - remove foley - treat infection PRN  Bilateral arm swelling- appears stable and symmetrical to me today. RUE duplex negative for DVT. Endorses improved pain. Positive erythema at IV site. - analgesia PRN - remove IV - warm compress  Epigastric pain   constipation (resolved)-  improved. patient had Bms  - bowel regimen  Hyperthyroidism? Graves disease?- TSH mildly decreased. 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 T4 mildly elevated, normal T3.  - thyroid US when feeling improved - continue betablocker - continue low dose methimazole      HTN- mildly elevated -continue home Amlodipine -Hold HCTZ and lisinopril due to hyponatremia   Hyponatremia   AKI- Sodium 121>>128>126>127>126. Typically COVID associated hyponatremia corrects rapidly but this is prolonged course. Patient has had poor PO intake with feeling ill and had acute event of Na+ 117 overnight which has been improved with dextrose, lasix and now is 122>123>129  technically has AKI with baseline Cr around 0.7 and is 1.03>1.07>0.91>0.94 today. - nephrology consult, appreciate recs  - albumin  - lasix - Na+ monitoring  BID - encouraged PO intake -Hold HCTZ -BMP am.  HLD -chronic, stable.  -Hold Crestor due to Paxlovid use   Diabetes mellitus without complication [facility]): Patient is taking Trulicity at home. Blood sugars elevated with steroid use.  -Sliding scale insulin  Fall at home, initial encounter: CT head negative. -PT/OT   Elevated troponin: Troponin level 21, no chest pain, likely demand ischemia -continue Aspirin   Nausea & vomiting and diarrhea- resolved. Most likely due to COVID infection.  No fever or leukocytosis.  Low suspicions for C. Difficile. Able to tolerate increased diet - supportive care  Body mass index is 29.18 kg/m.  VTE ppx: enoxaparin (LOVENOX) injection  40 mg Start: 11/12/21 1000   Diet:     Diet   Diet Carb Modified Fluid consistency: Thin; Room service appropriate? Yes; Fluid restriction: 1800 mL Fluid   Subjective 11/20/21    Pt reports doing ok. Feels very tired. No complaints   Objective   Vitals:   11/19/21 2049 11/20/21 0009 11/20/21 0430 11/20/21 0504  BP: (!) 142/60 (!) 140/56   (!) 130/52  Pulse: 74 76 77 81  Resp: 16 16  20   Temp: 99 F (37.2 C) 99.4 F (37.4 C)  98 F (36.7 C)  TempSrc:    Oral  SpO2: 95% 97% 99%   Weight:      Height:        Intake/Output Summary (Last 24 hours) at 11/20/2021 0725 Last data filed at 11/20/2021 0430 Gross per 24 hour  Intake 97.54 ml  Output 2825 ml  Net -2727.46 ml    Filed Weights   11/12/21 0554  Weight: 77.1 kg     Physical Exam:  General: awake, alert, NAD. Ill-appearing HEENT: atraumatic, clear conjunctiva, anicteric sclera, MMM, hearing grossly normal Respiratory: normal respiratory effort. CTAB Cardiovascular: normal S1/S2, RRR, no JVD, murmurs, quick capillary refill  Gastrointestinal: soft, non tender to epigastric area Nervous: A&O x3. no gross focal neurologic deficits, normal speech Extremities: moves all equally, generalized edema, normal tone Skin: dry, intact, normal temperature, normal color. No rashes, lesions or ulcers on exposed skin Psychiatry: normal mood, congruent affect  Labs   I have personally reviewed the following labs and imaging studies CBC    Component Value Date/Time   WBC 8.3 11/19/2021 0643   RBC 4.15 11/19/2021 0643   HGB 12.4 11/19/2021 0643   HGB 13.1 10/11/2013 0441   HCT 35.2 (L) 11/19/2021 0643   HCT 39.9 10/11/2013 0441   PLT 180 11/19/2021 0643   PLT 247 10/11/2013 0441   MCV 84.8 11/19/2021 0643   MCV 92 10/11/2013 0441   MCH 29.9 11/19/2021 0643   MCHC 35.2 11/19/2021 0643   RDW 12.3 11/19/2021 0643   RDW 14.1 10/11/2013 0441   LYMPHSABS 1.3 11/19/2021 0643   LYMPHSABS 1.0 10/11/2013 0441   MONOABS 1.0 11/19/2021 0643   MONOABS 0.3 10/11/2013 0441   EOSABS 0.0 11/19/2021 0643   EOSABS 0.0 10/11/2013 0441   BASOSABS 0.0 11/19/2021 0643   BASOSABS 0.0 10/11/2013 0441   BMP Latest Ref Rng & Units 11/20/2021 11/20/2021 11/19/2021  Glucose 70 - 99 mg/dL 01/19/2022) - -  BUN 8 - 23 mg/dL 21 - -  Creatinine 202(R - 1.00 mg/dL 4.27 - -  Sodium 0.62 - 145 mmol/L  129(L) 128(L) 128(L)  Potassium 3.5 - 5.1 mmol/L 3.3(L) - -  Chloride 98 - 111 mmol/L 92(L) - -  CO2 22 - 32 mmol/L 28 - -  Calcium 8.9 - 10.3 mg/dL 376) - -    CT HEAD WO CONTRAST (2.8(B)  Result Date: 11/18/2021 CLINICAL DATA:  Altered mental status EXAM: CT HEAD WITHOUT CONTRAST TECHNIQUE: Contiguous axial images were obtained from the base of the skull through the vertex without intravenous contrast. RADIATION DOSE REDUCTION: This exam was performed according to the departmental dose-optimization program which includes automated exposure control, adjustment of the mA and/or kV according to patient size and/or use of iterative reconstruction technique. COMPARISON:  11/12/2021 FINDINGS: Brain: No evidence of acute infarction, hemorrhage, hydrocephalus, extra-axial collection or mass lesion/mass effect. Mild atrophic changes are noted. Vascular: No hyperdense vessel or unexpected calcification. Skull: Normal. Negative for fracture  or focal lesion. Sinuses/Orbits: Air-fluid level is noted within the left maxillary antrum of a chronic nature stable in appearance from the prior exam. Other: None IMPRESSION: Chronic atrophic changes without acute abnormality. 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 Electronically Signed   By: Alcide Clever M.D.   On: 11/18/2021 20:01   DG Chest Port 1 View  Result Date: 11/19/2021 CLINICAL DATA:  Hypoxia EXAM: PORTABLE CHEST 1 VIEW COMPARISON:  11/12/2021 FINDINGS: Cardiac shadow is mildly enlarged but stable. Aortic calcifications are seen. The lungs are well aerated bilaterally. Mild vascular congestion is noted without significant edema. No focal infiltrate is noted. No bony abnormality is seen. IMPRESSION: Mild vascular congestion without significant edema. Electronically Signed   By: Alcide Clever M.D.   On: 11/19/2021 00:46    Disposition Plan & Communication  Patient status: Inpatient  Admitted From: Home Planned disposition location: SNF  recommended Anticipated discharge date: 3/8 pending improvement in Na+, oxygen wean, clinical impovement  Family Communication: none    Author: Leeroy Bock, DO [facility] 11/20/2021, 7:25 AM   Available by Epic secure chat 7AM-7PM. If 7PM-7AM, please contact night-coverage.  [facility] contact information found on [email or web address].

## 2021-11-21 DIAGNOSIS — U071 COVID-19: Secondary | ICD-10-CM | POA: Diagnosis not present

## 2021-11-21 DIAGNOSIS — E119 Type 2 diabetes mellitus without complications: Secondary | ICD-10-CM | POA: Diagnosis not present

## 2021-11-21 DIAGNOSIS — J441 Chronic obstructive pulmonary disease with (acute) exacerbation: Secondary | ICD-10-CM | POA: Diagnosis not present

## 2021-11-21 DIAGNOSIS — E876 Hypokalemia: Secondary | ICD-10-CM | POA: Diagnosis not present

## 2021-11-21 LAB — RENAL FUNCTION PANEL
Albumin: 3 g/dL — ABNORMAL LOW (ref 3.5–5.0)
Anion gap: 10 (ref 5–15)
BUN: 17 mg/dL (ref 8–23)
CO2: 28 mmol/L (ref 22–32)
Calcium: 9.1 mg/dL (ref 8.9–10.3)
Chloride: 93 mmol/L — ABNORMAL LOW (ref 98–111)
Creatinine, Ser: 0.9 mg/dL (ref 0.44–1.00)
GFR, Estimated: >60 mL/min (ref >60)
Glucose, Bld: 211 mg/dL — ABNORMAL HIGH (ref 70–99)
Phosphorus: 2.5 mg/dL (ref 2.5–4.6)
Potassium: 3.2 mmol/L — ABNORMAL LOW (ref 3.5–5.1)
Sodium: 131 mmol/L — ABNORMAL LOW (ref 135–145)

## 2021-11-21 LAB — GLUCOSE, CAPILLARY
Glucose-Capillary: 176 mg/dL — ABNORMAL HIGH (ref 70–99)
Glucose-Capillary: 194 mg/dL — ABNORMAL HIGH (ref 70–99)
Glucose-Capillary: 197 mg/dL — ABNORMAL HIGH (ref 70–99)
Glucose-Capillary: 242 mg/dL — ABNORMAL HIGH (ref 70–99)

## 2021-11-21 MED ORDER — PREDNISONE 5 MG PO TABS: ORAL_TABLET | ORAL | 0 refills | Status: AC

## 2021-11-21 MED ORDER — MELATONIN 5 MG PO TABS
2.5000 mg | ORAL_TABLET | Freq: Every evening | ORAL | Status: DC | PRN
  Administered 2021-11-21: 2.5 mg via ORAL
  Filled 2021-11-21: qty 1

## 2021-11-21 MED ORDER — FUROSEMIDE 20 MG PO TABS
10.0000 mg | ORAL_TABLET | Freq: Two times a day (BID) | ORAL | 0 refills | Status: DC
Start: 2021-11-21 — End: 2022-02-21

## 2021-11-21 MED ORDER — ATENOLOL 25 MG PO TABS
25.0000 mg | ORAL_TABLET | Freq: Every day | ORAL | 0 refills | Status: DC
Start: 2021-11-21 — End: 2023-08-28

## 2021-11-21 MED ORDER — METHIMAZOLE 5 MG PO TABS: 5.0000 mg | ORAL_TABLET | Freq: Every day | ORAL | 0 refills | Status: DC

## 2021-11-21 MED ORDER — POTASSIUM CHLORIDE CRYS ER 20 MEQ PO TBCR: 40.0000 meq | EXTENDED_RELEASE_TABLET | Freq: Once | ORAL | Status: DC

## 2021-11-21 NOTE — Progress Notes (Incomplete)
PROGRESS NOTE  Kendra Clarke    DOB: [birth year], 81 y.o.  BT:[id number]    Code Status: Full Code   DOA: 11/12/2021   LOS: 9   Brief hospital course  Kendra Clarke is a 81 y.o. female with a PMH significant for HTN, HLD, type II DM, COPD, asthma, varicose veins, ventral hernia, superficial thrombophlebitis. They presented from home to the ED on 11/12/2021 with cough, SOB, N/V/D x 7 days.  In the ED, it was found that they had COVID+, hyponatremia, bronchitis changes on chest xray. Had negative head CT  They were treated with paxlovid, steroids, IV fluids, and supportive care.  Patient was admitted to medicine service for further workup and management of acute respiratory disorder without hypoxia as outlined in detail below.  11/21/21 -stable, feeling tired  Assessment & Plan  Principal Problem:   Acute respiratory disease due to COVID-19 virus Active Problems:   Sepsis ([place])   Bronchitis   HTN (hypertension)   HLD (hyperlipidemia)   COPD exacerbation (HCC)   Diabetes mellitus without complication ([place])   Fall at home, initial encounter   Hyponatremia   Hypokalemia   Disorder of electrolytes   Elevated troponin   Nausea & vomiting   Hypomagnesemia   Abdominal pain   Edema   Hypoxia  Sepsis - sepsis criteria have resolved.  Acute hypoxic respiratory failure 2/2 COVID-19   COPD exacerbation- She is on 2L this morning for comfort. Did not have desaturation. S/p paxlovid - continue steroids - supportive care PRN - pulse ox with ambulation - wean O2 as tolerated - bronchodilators PRN, and scheduled - Kendra Clarke/OT- now recommending SNF. She will likely decline  Dysuria- foley in place. No obstruction.  - clean catch urinalysis - remove foley - treat infection PRN  Bilateral arm swelling- appears stable and symmetrical to me today. RUE duplex negative for DVT. Endorses improved pain. Positive erythema at IV site. - analgesia PRN - remove IV - warm compress  Epigastric  pain   constipation (resolved)- improved. patient had Bms  - bowel regimen  Hyperthyroidism? Graves disease?- TSH mildly decreased. 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 T4 mildly elevated, normal T3.  - thyroid US when feeling improved - continue betablocker - continue low dose methimazole      HTN- mildly elevated -continue home Amlodipine -Hold HCTZ and lisinopril due to hyponatremia   Hyponatremia   AKI- Sodium 121>>128>126>127>126>131. Typically COVID associated hyponatremia corrects rapidly but this is prolonged course. Patient has had poor PO intake with feeling ill and had acute event of Na+ 117 overnight which has been improved with dextrose, lasix and now is 122>123>129  technically has AKI with baseline Cr around 0.7 and is 1.03>1.07>0.91>0.94 today. - nephrology consult, appreciate recs  - albumin  - lasix - Na+ monitoring  BID - encouraged PO intake -Hold HCTZ -BMP am.  HLD -chronic, stable.  -Hold Crestor due to Paxlovid use   Diabetes mellitus without complication [facility] Patient is taking Trulicity at home. Blood sugars elevated with steroid use.  -Sliding scale insulin  Fall at home, initial encounter: CT head negative. -Kendra Clarke/OT   Elevated troponin: Troponin level 21, no chest pain, likely demand ischemia -continue Aspirin   Nausea & vomiting and diarrhea- resolved. Most likely due to COVID infection.  No fever or leukocytosis.  Low suspicions for C. Difficile. Able to tolerate increased diet - supportive care  Body mass index is  29.18 kg/m.  VTE ppx: enoxaparin (LOVENOX) injection 40 mg Start: 11/12/21 1000   Diet:     Diet   Diet Carb Modified Fluid consistency: Thin; Room service appropriate? Yes; Fluid restriction: 1800 mL Fluid   Subjective 11/21/21    Kendra Clarke reports doing ok. Feels very tired. No complaints   Objective   Vitals:   11/20/21 1201 11/20/21 1712 11/20/21 2119 11/21/21  0433  BP: (!) 120/53 (!) 125/56 (!) 148/62 (!) 142/56  Pulse: 68 76 82 74  Resp: 15 17 16 16   Temp: 98.5 F (36.9 C) 98.6 F (37 C) 98.7 F (37.1 C) 98.1 F (36.7 C)  TempSrc: Oral   Oral  SpO2: 97% 99% 99% 98%  Weight:      Height:        Intake/Output Summary (Last 24 hours) at 11/21/2021 [id number] Last data filed at 11/20/2021 1500 Gross per 24 hour  Intake --  Output 700 ml  Net -700 ml    Filed Weights   11/12/21 0554  Weight: 77.1 kg     Physical Exam:  General: awake, alert, NAD. Ill-appearing HEENT: atraumatic, clear conjunctiva, anicteric sclera, MMM, hearing grossly normal Respiratory: normal respiratory effort. CTAB Cardiovascular: normal S1/S2, RRR, no JVD, murmurs, quick capillary refill  Gastrointestinal: soft, non tender to epigastric area Nervous: A&O x3. no gross focal neurologic deficits, normal speech Extremities: moves all equally, generalized edema, normal tone Skin: dry, intact, normal temperature, normal color. No rashes, lesions or ulcers on exposed skin Psychiatry: normal mood, congruent affect  Labs   I have personally reviewed the following labs and imaging studies CBC    Component Value Date/Time   WBC 8.3 11/19/2021 0643   RBC 4.15 11/19/2021 0643   HGB 12.4 11/19/2021 0643   HGB 13.1 10/11/2013 0441   HCT 35.2 (L) 11/19/2021 0643   HCT 39.9 10/11/2013 0441   PLT 180 11/19/2021 0643   PLT 247 10/11/2013 0441   MCV 84.8 11/19/2021 0643   MCV 92 10/11/2013 0441   MCH 29.9 11/19/2021 0643   MCHC 35.2 11/19/2021 0643   RDW 12.3 11/19/2021 0643   RDW 14.1 10/11/2013 0441   LYMPHSABS 1.3 11/19/2021 0643   LYMPHSABS 1.0 10/11/2013 0441   MONOABS 1.0 11/19/2021 0643   MONOABS 0.3 10/11/2013 0441   EOSABS 0.0 11/19/2021 0643   EOSABS 0.0 10/11/2013 0441   BASOSABS 0.0 11/19/2021 0643   BASOSABS 0.0 10/11/2013 0441   BMP Latest Ref Rng & Units 11/21/2021 11/20/2021 11/20/2021  Glucose 70 - 99 mg/dL 211(H) 146(H) -  BUN 8 - 23 mg/dL 17 21 -   Creatinine 0.44 - 1.00 mg/dL 0.90 0.94 -  Sodium 135 - 145 mmol/L 131(L) 129(L) 128(L)  Potassium 3.5 - 5.1 mmol/L 3.2(L) 3.3(L) -  Chloride 98 - 111 mmol/L 93(L) 92(L) -  CO2 22 - 32 mmol/L 28 28 -  Calcium 8.9 - 10.3 mg/dL 9.1 8.7(L) -    No results found.  Disposition Plan & Communication  Patient status: Inpatient  Admitted From: Home Planned disposition location: SNF recommended Anticipated discharge date: 3/8 pending improvement in Na+, oxygen wean, clinical impovement  Family Communication: none    Author: Richarda Osmond, DO [facility] 11/21/2021, 7:12 AM   Available by Epic secure chat 7AM-7PM. If 7PM-7AM, please contact night-coverage.  [facility] contact information found on [email or web address].

## 2021-11-21 NOTE — Progress Notes (Signed)
Pt wants to leave AMA, refusing to go to SNF. She said she feels much better and is independent at baseline. Dr. Dareen Piano aware, spoke to pt. Pt still adamant about leaving AMA. Granddaughter here to transport pt home. AMA form signed. IV and tele removed. ?

## 2021-11-21 NOTE — Discharge Summary (Incomplete)
Physician Discharge Summary  Patient: Kendra Clarke EAV:[id number] DOB: 1941/05/19   Code Status: Full Code Admit date: 11/12/2021 Discharge date: 11/21/2021 Disposition: {Plan; Disposition:26390}, {HH services:27085} PCP: Shane CrutchMarkley, Linda, PA  Recommendations for Outpatient Follow-up:  Follow up with PCP within 1 week Monitor blood pressure. Medications changes as listed below Monitor respiratory status, discharged with home O2, may be able to wean off when better recovered from COVID-19. She was recommended to discharge to SNF but declined. Monitor steroid taper Metabolic panel- hyponatremia and hypokalemia while inpatient which improved to Na+ 131, K+ 3.2 on day of discharge. K+ replaced prior to dc Follow up with endocrinology Decreased TSH on admission with suspicion for graves disease which was treated with atenolol and methimazole.   Discharge Diagnoses:  Principal Problem:   Acute respiratory disease due to COVID-19 virus Active Problems:   Sepsis (HCC)   Bronchitis   HTN (hypertension)   HLD (hyperlipidemia)   COPD exacerbation (HCC)   Diabetes mellitus without complication (HCC)   Fall at home, initial encounter   Hyponatremia   Hypokalemia   Disorder of electrolytes   Elevated troponin   Nausea & vomiting   Hypomagnesemia   Abdominal pain   Edema   Hypoxia  Brief Hospital Course Summary: Pt ***  Discharge Condition: {DISCHARGE CONDITION:19696}, improved Recommended discharge diet: {Discharge NWGN:[id number]}iet:[id number]}  Consultations: ***  Procedures/Studies: ***   Allergies as of 11/21/2021       Reactions   Albuterol Anaphylaxis   Other reaction(s): Other (See Comments) Patient states it gives her an asthma attack   Codeine Anxiety   Sulfa Antibiotics Rash        Medication List     STOP taking these medications    amoxicillin-clavulanate 875-125 MG tablet Commonly known as: AUGMENTIN   Atrovent HFA 17 MCG/ACT inhaler Generic drug: ipratropium    erythromycin ophthalmic ointment   glipiZIDE 10 MG 24 hr tablet Commonly known as: GLUCOTROL XL   hydrochlorothiazide 12.5 MG capsule Commonly known as: MICROZIDE   Ipratropium-Albuterol 20-100 MCG/ACT Aers respimat Commonly known as: Combivent Respimat   lactobacillus Pack   loperamide 2 MG capsule Commonly known as: IMODIUM   metFORMIN 1000 MG tablet Commonly known as: GLUCOPHAGE   oseltamivir 30 MG capsule Commonly known as: TAMIFLU   oxyCODONE-acetaminophen 5-325 MG tablet Commonly known as: PERCOCET/ROXICET   Pataday 0.2 % Soln Generic drug: Olopatadine HCl   protein supplement shake Liqd Commonly known as: PREMIER PROTEIN   rosuvastatin 10 MG tablet Commonly known as: CRESTOR   Tradjenta 5 MG Tabs tablet Generic drug: linagliptin       TAKE these medications    amLODipine 10 MG tablet Commonly known as: NORVASC Take 10 mg by mouth daily.   atenolol 25 MG tablet Commonly known as: TENORMIN Take 1 tablet (25 mg total) by mouth daily.   furosemide 20 MG tablet Commonly known as: LASIX Take 0.5 tablets (10 mg total) by mouth 2 (two) times daily.   lisinopril 20 MG tablet Commonly known as: ZESTRIL Take by mouth.   methimazole 5 MG tablet Commonly known as: TAPAZOLE Take 1 tablet (5 mg total) by mouth daily.   multivitamin with minerals Tabs tablet Take 1 tablet by mouth daily.   predniSONE 5 MG tablet Commonly known as: DELTASONE Take 6 tablets (30 mg total) by mouth daily with breakfast for 3 days, THEN 4 tablets (20 mg total) daily with breakfast for 3 days, THEN 2 tablets (10 mg total) daily with breakfast for  3 days, THEN 1 tablet (5 mg total) daily with breakfast for 7 days, THEN 0.5 tablets (2.5 mg total) daily with breakfast for 7 days. Start taking on: November 21, 2021 What changed:  medication strength See the new instructions.   Symbicort 160-4.5 MCG/ACT inhaler Generic drug: budesonide-formoterol INHALE 2 PUFFS TWICE DAILY FOR  ASTHMA   Trulicity 0.75 MG/0.5ML Sopn Generic drug: Dulaglutide               Durable Medical Equipment  (From admission, onward)           Start     Ordered   11/21/21 0951  For home use only DME oxygen  Once       Question Answer Comment  Length of Need 6 Months   Mode or (Route) Nasal cannula   Liters per Minute 2   Frequency Continuous (stationary and portable oxygen unit needed)   Oxygen conserving device Yes   Oxygen delivery system Gas      11/21/21 0951             Subjective   Pt reports *** Objective  Blood pressure (!) 147/62, pulse 72, temperature 98.2 F (36.8 C), temperature source Oral, resp. rate 17, height 5\' 4"  (1.626 m), weight 77.1 kg, SpO2 99 %.   General: Pt is alert, awake, not in acute distress Cardiovascular: RRR, S1/S2 +, no rubs, no gallops Respiratory: CTA bilaterally, no wheezing, no rhonchi Abdominal: Soft, NT, ND, bowel sounds + Extremities: no edema, no cyanosis   The results of significant diagnostics from this hospitalization (including imaging, microbiology, ancillary and laboratory) are listed below for reference.   Imaging studies: DG Chest 2 View  Result Date: 11/12/2021 CLINICAL DATA:  81 year old female with history of cough. EXAM: CHEST - 2 VIEW COMPARISON:  Chest x-ray 12/01/2017. FINDINGS: Lung volumes are normal. Mild diffuse peribronchial cuffing. No consolidative airspace disease. No pleural effusions. No pneumothorax. No pulmonary nodule or mass noted. Pulmonary vasculature and the cardiomediastinal silhouette are within normal limits. Atherosclerosis in the thoracic aorta. IMPRESSION: 1. Mild diffuse peribronchial cuffing, which could suggest an acute bronchitis. 2. Aortic atherosclerosis. Electronically Signed   By: 12/03/2017 M.D.   On: 11/12/2021 06:37   DG Abd 1 View  Result Date: 11/15/2021 CLINICAL DATA:  Nausea vomiting. EXAM: ABDOMEN - 1 VIEW COMPARISON:  CT abdomen pelvis dated 05/03/2015.  FINDINGS: No bowel dilatation or evidence of obstruction. No free air. Right upper quadrant cholecystectomy clips. The soft tissues are unremarkable. Degenerative changes of the spine. IMPRESSION: Negative. Electronically Signed   By: 05/05/2015 M.D.   On: 11/15/2021 21:36   CT HEAD WO CONTRAST (01/15/2022)  Result Date: 11/18/2021 CLINICAL DATA:  Altered mental status EXAM: CT HEAD WITHOUT CONTRAST TECHNIQUE: Contiguous axial images were obtained from the base of the skull through the vertex without intravenous contrast. RADIATION DOSE REDUCTION: This exam was performed according to the departmental dose-optimization program which includes automated exposure control, adjustment of the mA and/or kV according to patient size and/or use of iterative reconstruction technique. COMPARISON:  11/12/2021 FINDINGS: Brain: No evidence of acute infarction, hemorrhage, hydrocephalus, extra-axial collection or mass lesion/mass effect. Mild atrophic changes are noted. Vascular: No hyperdense vessel or unexpected calcification. Skull: Normal. Negative for fracture or focal lesion. Sinuses/Orbits: Air-fluid level is noted within the left maxillary antrum of a chronic nature stable in appearance from the prior exam. Other: None IMPRESSION: Chronic atrophic changes without acute abnormality. 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 Electronically  Signed   By: Alcide Clever M.D.   On: 11/18/2021 20:01   CT Head Wo Contrast  Result Date: 11/12/2021 CLINICAL DATA:  81 year old female with history of head trauma from a fall. EXAM: CT HEAD WITHOUT CONTRAST TECHNIQUE: Contiguous axial images were obtained from the base of the skull through the vertex without intravenous contrast. RADIATION DOSE REDUCTION: This exam was performed according to the departmental dose-optimization program which includes automated exposure control, adjustment of the mA and/or kV according to patient size and/or use of iterative reconstruction  technique. COMPARISON:  No priors FINDINGS: Brain: Patchy areas of decreased attenuation are noted throughout the deep and periventricular white matter of the cerebral hemispheres bilaterally, compatible with mild chronic microvascular ischemic disease. No evidence of acute infarction, hemorrhage, hydrocephalus, extra-axial collection or mass lesion/mass effect. Vascular: Numerous vascular calcifications are noted throughout the cerebral vasculature. No unexpected hyperdense vessels. Skull: Normal. Negative for fracture or focal lesion. Sinuses/Orbits: Multifocal mucosal thickening is noted in the paranasal sinuses, most severe in the maxillary sinuses bilaterally (left-greater-than-right). There are some high attenuation inspissated secretions also lying dependently in the left maxillary sinus. Other: None. IMPRESSION: 1. No acute intracranial abnormalities. 2. Mild chronic microvascular ischemic changes in the cerebral white matter, as above. 3. 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 Electronically Signed   By: Trudie Reed M.D.   On: 11/12/2021 06:25   US Venous Img Upper Uni Right(DVT)  Result Date: 11/16/2021 CLINICAL DATA:  Pain and swelling EXAM: Right UPPER EXTREMITY VENOUS DOPPLER ULTRASOUND TECHNIQUE: Gray-scale sonography with graded compression, as well as color Doppler and duplex ultrasound were performed to evaluate the upper extremity deep venous system from the level of the subclavian vein and including the jugular, axillary, basilic, radial, ulnar and upper cephalic vein. Spectral Doppler was utilized to evaluate flow at rest and with distal augmentation maneuvers. COMPARISON:  None. FINDINGS: Contralateral Subclavian Vein: Respiratory phasicity is normal and symmetric with the symptomatic side. No evidence of thrombus. Normal compressibility. Internal Jugular Vein: No evidence of thrombus. Normal compressibility, respiratory phasicity and  response to augmentation. Subclavian Vein: No evidence of thrombus. Normal compressibility, respiratory phasicity and response to augmentation. Axillary Vein: No evidence of thrombus. Normal compressibility, respiratory phasicity and response to augmentation. Cephalic Vein: No evidence of thrombus. Normal compressibility, respiratory phasicity and response to augmentation. Basilic Vein: No evidence of thrombus. Normal compressibility, respiratory phasicity and response to augmentation. Brachial Veins: No evidence of thrombus. Normal compressibility, respiratory phasicity and response to augmentation. Radial Veins: No evidence of thrombus. Normal compressibility, respiratory phasicity and response to augmentation. Ulnar Veins: No evidence of thrombus. Normal compressibility, respiratory phasicity and response to augmentation. Venous Reflux:  None visualized. Other Findings: There is edema in subcutaneous plane in the right forearm without loculated fluid collections. IMPRESSION: No evidence of DVT within the right upper extremity. Electronically Signed   By: Ernie Avena M.D.   On: 11/16/2021 12:14   DG Chest Port 1 View  Result Date: 11/19/2021 CLINICAL DATA:  Hypoxia EXAM: PORTABLE CHEST 1 VIEW COMPARISON:  11/12/2021 FINDINGS: Cardiac shadow is mildly enlarged but stable. Aortic calcifications are seen. The lungs are well aerated bilaterally. Mild vascular congestion is noted without significant edema. No focal infiltrate is noted. No bony abnormality is seen. IMPRESSION: Mild vascular congestion without significant edema. Electronically Signed   By: Alcide Clever M.D.   On: 11/19/2021 00:46    Labs: Basic Metabolic Panel: Recent Labs  Lab 11/18/21  0500 11/18/21 1925 11/18/21 2330 11/19/21 9798 11/19/21 0844 11/19/21 1743 11/19/21 2054 11/20/21 0030 11/20/21 0518 11/21/21 0517  NA 126* 117* 117* 122*   < > 127* 128* 128* 129* 131*  K 4.0 3.6 4.2 3.8  --   --   --   --  3.3* 3.2*  CL 91*  87* 87* 86*  --   --   --   --  92* 93*  CO2 26 25 23 26   --   --   --   --  28 28  GLUCOSE 177* 104* 89 103*  --   --   --   --  146* 211*  BUN 20 20 21 22   --   --   --   --  21 17  CREATININE 0.91 0.99 0.97 1.05*  --   --   --   --  0.94 0.90  CALCIUM 9.0 8.5* 8.8* 9.0  --   --   --   --  8.7* 9.1  MG  --   --  1.5*  --   --   --   --   --   --   --   PHOS 1.7*  --  2.2* 2.3*  --   --   --   --  3.2 2.5   < > = values in this interval not displayed.   CBC: Recent Labs  Lab 11/15/21 0614 11/18/21 0500 11/19/21 0643  WBC 10.9* 9.8 8.3  NEUTROABS 10.0*  --  5.7  HGB 13.4 13.0 12.4  HCT 39.2 38.2 35.2*  MCV 87.5 87.2 84.8  PLT 192 210 180   Microbiology: ***  Time coordinating discharge: Over 30 minutes  01/18/22, MD  [facility] 11/21/2021, 9:59 AM

## 2021-11-21 NOTE — Progress Notes (Signed)
[facility]  ROUNDING NOTE   Subjective:   Patient resting quietly Drowsy this morning, but able to answer simple questions Denies shortness of breath No lower extremity edema  Sodium 131   Objective:  Vital signs in last 24 hours:  Temp:  [98.1 F (36.7 C)-98.7 F (37.1 C)] 98.2 F (36.8 C) (03/07 0731) Pulse Rate:  [68-82] 72 (03/07 0731) Resp:  [15-17] 17 (03/07 0731) BP: (120-148)/(53-62) 147/62 (03/07 0731) SpO2:  [97 %-99 %] 99 % (03/07 0731)  Weight change:  Filed Weights   11/12/21 0554  Weight: 77.1 kg    Intake/Output: I/O last 3 completed shifts: In: 58.8 [IV Piggyback:58.8] Out: 2000 [Urine:2000]   Intake/Output this shift:  No intake/output data recorded.  Physical Exam: General: NAD  Head: Normocephalic, atraumatic. Moist oral mucosal membranes  Eyes: Anicteric  Lungs:  Clear to auscultation, normal effort  Heart: Regular rate and rhythm  Abdomen:  Soft, nontender  Extremities:  No peripheral edema.  Neurologic: Somnolent, moving all four extremities  Skin: No lesions       Basic Metabolic Panel: Recent Labs  Lab 11/18/21 0500 11/18/21 1925 11/18/21 2330 11/19/21 [id number] 11/19/21 0844 11/19/21 1743 11/19/21 2054 11/20/21 0030 11/20/21 0518 11/21/21 0517  NA 126* 117* 117* 122*   < > 127* 128* 128* 129* 131*  K 4.0 3.6 4.2 3.8  --   --   --   --  3.3* 3.2*  CL 91* 87* 87* 86*  --   --   --   --  92* 93*  CO2 26 25 23 26   --   --   --   --  28 28  GLUCOSE 177* 104* 89 103*  --   --   --   --  146* 211*  BUN 20 20 21 22   --   --   --   --  21 17  CREATININE 0.91 0.99 0.97 1.05*  --   --   --   --  0.94 0.90  CALCIUM 9.0 8.5* 8.8* 9.0  --   --   --   --  8.7* 9.1  MG  --   --  1.5*  --   --   --   --   --   --   --   PHOS 1.7*  --  2.2* 2.3*  --   --   --   --  3.2 2.5   < > = values in this interval not displayed.     Liver Function Tests: Recent Labs  Lab 11/17/21 0458 11/18/21 0500 11/19/21 0643 11/20/21 0518  11/21/21 0517  AST 44*  --  69*  --   --   ALT 40  --  50*  --   --   ALKPHOS 66  --  65  --   --   BILITOT 1.1  --  1.7*  --   --   PROT 5.7*  --  5.9*  --   --   ALBUMIN 2.8* 3.3* 3.5 2.9* 3.0*    No results for input(s): LIPASE, AMYLASE in the last 168 hours.  No results for input(s): AMMONIA in the last 168 hours.  CBC: Recent Labs  Lab 11/15/21 0614 11/18/21 0500 11/19/21 0643  WBC 10.9* 9.8 8.3  NEUTROABS 10.0*  --  5.7  HGB 13.4 13.0 12.4  HCT 39.2 38.2 35.2*  MCV 87.5 87.2 84.8  PLT 192 210 180     Cardiac Enzymes: No  results for input(s): CKTOTAL, CKMB, CKMBINDEX, TROPONINI in the last 168 hours.  BNP: Invalid input(s): POCBNP  CBG: Recent Labs  Lab 11/20/21 1715 11/20/21 2115 11/21/21 0047 11/21/21 0428 11/21/21 0733  GLUCAP 175* 236* 242* 176* 197*     Microbiology: Results for orders placed or performed during the hospital encounter of 11/12/21  Resp Panel by RT-PCR (Flu A&B, Covid) Nasopharyngeal Swab     Status: Abnormal   Collection Time: 11/12/21  8:19 AM   Specimen: Nasopharyngeal Swab; Nasopharyngeal(NP) swabs in vial transport medium  Result Value Ref Range Status   SARS Coronavirus 2 by RT PCR POSITIVE (A) NEGATIVE Final    Comment: (NOTE) SARS-CoV-2 target nucleic acids are DETECTED.  The SARS-CoV-2 RNA is generally detectable in upper respiratory specimens during the acute phase of infection. Positive results are indicative of the presence of the identified virus, but do not rule out bacterial infection or co-infection with other pathogens not detected by the test. Clinical correlation with patient history and other diagnostic information is necessary to determine patient infection status. The expected result is Negative.  Fact Sheet for Patients: EntrepreneurPulse.com.au  Fact Sheet for Healthcare Providers: IncredibleEmployment.be  This test is not yet approved or cleared by the [place] FDA and  has been authorized for detection and/or diagnosis of SARS-CoV-2 by FDA under an Emergency Use Authorization (EUA).  This EUA will remain in effect (meaning this test can be used) for the duration of  the COVID-19 declaration under Section 564(b)(1) of the A ct, 21 U.S.C. section 360bbb-3(b)(1), unless the authorization is terminated or revoked sooner.     Influenza A by PCR NEGATIVE NEGATIVE Final   Influenza B by PCR NEGATIVE NEGATIVE Final    Comment: (NOTE) The Xpert Xpress SARS-CoV-2/FLU/RSV plus assay is intended as an aid in the diagnosis of influenza from Nasopharyngeal swab specimens and should not be used as a sole basis for treatment. Nasal washings and aspirates are unacceptable for Xpert Xpress SARS-CoV-2/FLU/RSV testing.  Fact Sheet for Patients: EntrepreneurPulse.com.au  Fact Sheet for Healthcare Providers: IncredibleEmployment.be  This test is not yet approved or cleared by the [place] FDA and has been authorized for detection and/or diagnosis of SARS-CoV-2 by FDA under an Emergency Use Authorization (EUA). This EUA will remain in effect (meaning this test can be used) for the duration of the COVID-19 declaration under Section 564(b)(1) of the Act, 21 U.S.C. section 360bbb-3(b)(1), unless the authorization is terminated or revoked.  Performed at [facility], [address] [place], [place] [address]   Blood culture (single)     Status: None   Collection Time: 11/12/21  8:19 AM   Specimen: BLOOD  Result Value Ref Range Status   Specimen Description BLOOD RIGHT ANTECUBITAL  Final   Special Requests   Final    BOTTLES DRAWN AEROBIC AND ANAEROBIC Blood Culture results may not be optimal due to an excessive volume of blood received in culture bottles   Culture   Final    NO GROWTH 5 DAYS Performed at [facility], [address], [place], [place] [address]    Report Status  11/17/2021 FINAL  Final  Expectorated Sputum Assessment w Gram Stain, Rflx to Resp Cult     Status: None   Collection Time: 11/17/21  7:15 AM   Specimen: Sputum  Result Value Ref Range Status   Specimen Description SPUTUM  Final   Special Requests NONE  Final   Sputum evaluation   Final  THIS SPECIMEN IS ACCEPTABLE FOR SPUTUM CULTURE Performed at [facility], [address] [place], [place] [address]    Report Status 11/17/2021 FINAL  Final  Culture, Respiratory w Gram Stain     Status: None   Collection Time: 11/17/21  7:15 AM   Specimen: SPU  Result Value Ref Range Status   Specimen Description   Final    SPUTUM Performed at [facility], [address], [place], [place] [address]    Special Requests   Final    NONE Reflexed from [id number] Performed at [facility], [address] [place], [place] [address]    Gram Stain   Final    FEW WBC PRESENT,BOTH PMN AND MONONUCLEAR FEW GRAM POSITIVE COCCI IN PAIRS    Culture   Final    ABUNDANT Consistent with normal respiratory flora. No Pseudomonas species isolated Performed at [facility] [address], [place], [place] [address]    Report Status 11/20/2021 FINAL  Final    Coagulation Studies: No results for input(s): LABPROT, INR in the last 72 hours.  Urinalysis: Recent Labs    11/20/21 1520  COLORURINE YELLOW*  LABSPEC 1.014  PHURINE 5.0  GLUCOSEU 50*  HGBUR LARGE*  BILIRUBINUR NEGATIVE  KETONESUR NEGATIVE  PROTEINUR 30*  NITRITE NEGATIVE  LEUKOCYTESUR LARGE*       Imaging: No results found.   Medications:    albumin human 12.5 g (11/21/21 1001)    amLODipine  10 mg Oral Daily   aspirin EC  81 mg Oral Daily   atenolol  25 mg Oral Daily   Chlorhexidine Gluconate Cloth  6 each Topical Daily   enoxaparin (LOVENOX) injection  40 mg Subcutaneous Q24H   fluticasone  2 puff Inhalation BID   furosemide  10 mg Oral BID   guaiFENesin  600 mg Oral BID   insulin  aspart  0-9 Units Subcutaneous TID WC   insulin aspart  3 Units Subcutaneous TID WC   lisinopril  20 mg Oral Daily   mouth rinse  15 mL Mouth Rinse BID   methimazole  5 mg Oral Daily   multivitamin with minerals  1 tablet Oral Daily   nystatin  5 mL Oral BID   pantoprazole  40 mg Oral Daily   potassium chloride  40 mEq Oral Once   predniSONE  40 mg Oral Q breakfast   acetaminophen, ipratropium, liver oil-zinc oxide, melatonin, ondansetron (ZOFRAN) IV, prochlorperazine  Assessment/ Plan:  Ms. Kendra Clarke is a 81 y.o.  female  with medical conditions including hypertension, diabetes, hyperlipidemia, asthma, superficial thrombophlebitis, who was admitted to [facility] on 11/12/2021 for Acute bronchitis [J20.9] Hypokalemia [E87.6] Hyponatremia [E87.1] Bronchitis [J40] COPD exacerbation ([place]) [J44.1]   Hyponatremia with baseline 129-131.  Treated with fluid restriction and IV fluids with initial improvement to 129. Patient hyponatremia is secondary to her pulmonary and liver issues. 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   Sodium stable at 131   2.  Hypertension.   Patient blood pressure on the higher side Patient is on amlodipine, lisinopril and atenolol Patient hydrochlorothiazide is currently held  BP 150/71     3.  Type 2 diabetes mellitus.  Noninsulin-dependent.  Home medications include glipizide and Tradjenta.  Primary team to manage SSI  Glucose elevated but stable   4.Acute kidney injury Patient had AKI and patient creatinine had peaked at 1.07 Patient baseline creatinine is 0.6--0.7 Renal function at baseline  5.Hypophosphatemia Patient  phosphorus is low This is secondary to poor p.o. intake Continue to encourage oral intake   6. Acute hypoxic respiratory failure due to COVID-19 Primary team is following    LOS: 9 [place] 3/7/202310:35 AM

## 2021-11-21 NOTE — Care Management Important Message (Signed)
Important Message ? ?Patient Details  ?Name: Kendra Clarke ?MRN: [id number] ?Date of Birth: 1941/08/16 ? ? ?Medicare Important Message Given:  Other (see comment) ? ?Patient is in an isolation room and I called her room [phone]) to review the Important Message from Medicare with her but there was no answer. ? ? ?Olegario Messier A Harrison Paulson ?11/21/2021, 12:02 PM ?

## 2021-11-21 NOTE — Progress Notes (Deleted)
SATURATION QUALIFICATIONS: (This note is used to comply with regulatory documentation for home oxygen)  Patient Saturations on Room Air at Rest = 97%  Patient Saturations on Room Air while Ambulating = 87%  Patient Saturations on 2 Liters of oxygen while Ambulating = 95%  Please briefly explain why patient needs home oxygen: 

## 2021-11-21 NOTE — Plan of Care (Signed)
°  Problem: Education: °Goal: Knowledge of disease or condition will improve °Outcome: Progressing °Goal: Knowledge of the prescribed therapeutic regimen will improve °Outcome: Progressing °Goal: Individualized Educational Video(s) °Outcome: Progressing °  °

## 2022-02-19 ENCOUNTER — Other Ambulatory Visit: Payer: Self-pay

## 2022-02-19 ENCOUNTER — Emergency Department: Payer: Medicare HMO

## 2022-02-19 ENCOUNTER — Inpatient Hospital Stay
Admission: EM | Admit: 2022-02-19 | Discharge: 2022-02-21 | DRG: 189 | Disposition: A | Discharge status: Home Health | Payer: Medicare HMO | Attending: Internal Medicine | Admitting: Internal Medicine

## 2022-02-19 ENCOUNTER — Encounter: Payer: Self-pay | Admitting: Emergency Medicine

## 2022-02-19 DIAGNOSIS — Z8249 Family history of ischemic heart disease and other diseases of the circulatory system: Secondary | ICD-10-CM | POA: Diagnosis not present

## 2022-02-19 DIAGNOSIS — E876 Hypokalemia: Secondary | ICD-10-CM | POA: Diagnosis present

## 2022-02-19 DIAGNOSIS — I5032 Chronic diastolic (congestive) heart failure: Secondary | ICD-10-CM | POA: Diagnosis present

## 2022-02-19 DIAGNOSIS — E119 Type 2 diabetes mellitus without complications: Secondary | ICD-10-CM | POA: Diagnosis present

## 2022-02-19 DIAGNOSIS — E440 Moderate protein-calorie malnutrition: Secondary | ICD-10-CM

## 2022-02-19 DIAGNOSIS — Z825 Family history of asthma and other chronic lower respiratory diseases: Secondary | ICD-10-CM | POA: Diagnosis not present

## 2022-02-19 DIAGNOSIS — Z79899 Other long term (current) drug therapy: Secondary | ICD-10-CM

## 2022-02-19 DIAGNOSIS — Z7985 Long-term (current) use of injectable non-insulin antidiabetic drugs: Secondary | ICD-10-CM

## 2022-02-19 DIAGNOSIS — Z885 Allergy status to narcotic agent status: Secondary | ICD-10-CM

## 2022-02-19 DIAGNOSIS — Z7951 Long term (current) use of inhaled steroids: Secondary | ICD-10-CM

## 2022-02-19 DIAGNOSIS — L89311 Pressure ulcer of right buttock, stage 1: Secondary | ICD-10-CM | POA: Diagnosis present

## 2022-02-19 DIAGNOSIS — Z7984 Long term (current) use of oral hypoglycemic drugs: Secondary | ICD-10-CM

## 2022-02-19 DIAGNOSIS — Z882 Allergy status to sulfonamides status: Secondary | ICD-10-CM | POA: Diagnosis not present

## 2022-02-19 DIAGNOSIS — I110 Hypertensive heart disease with heart failure: Secondary | ICD-10-CM | POA: Diagnosis present

## 2022-02-19 DIAGNOSIS — K769 Liver disease, unspecified: Secondary | ICD-10-CM | POA: Diagnosis present

## 2022-02-19 DIAGNOSIS — Z6826 Body mass index (BMI) 26.0-26.9, adult: Secondary | ICD-10-CM | POA: Diagnosis not present

## 2022-02-19 DIAGNOSIS — J9601 Acute respiratory failure with hypoxia: Principal | ICD-10-CM | POA: Diagnosis present

## 2022-02-19 DIAGNOSIS — Z888 Allergy status to other drugs, medicaments and biological substances status: Secondary | ICD-10-CM

## 2022-02-19 DIAGNOSIS — N179 Acute kidney failure, unspecified: Secondary | ICD-10-CM

## 2022-02-19 DIAGNOSIS — Z66 Do not resuscitate: Secondary | ICD-10-CM | POA: Diagnosis present

## 2022-02-19 DIAGNOSIS — J449 Chronic obstructive pulmonary disease, unspecified: Secondary | ICD-10-CM | POA: Diagnosis present

## 2022-02-19 DIAGNOSIS — Z8616 Personal history of COVID-19: Secondary | ICD-10-CM | POA: Diagnosis not present

## 2022-02-19 DIAGNOSIS — Z28310 Unvaccinated for covid-19: Secondary | ICD-10-CM | POA: Diagnosis not present

## 2022-02-19 DIAGNOSIS — I10 Essential (primary) hypertension: Secondary | ICD-10-CM | POA: Diagnosis present

## 2022-02-19 DIAGNOSIS — R490 Dysphonia: Secondary | ICD-10-CM | POA: Diagnosis present

## 2022-02-19 DIAGNOSIS — K121 Other forms of stomatitis: Secondary | ICD-10-CM | POA: Diagnosis present

## 2022-02-19 DIAGNOSIS — J029 Acute pharyngitis, unspecified: Secondary | ICD-10-CM | POA: Diagnosis present

## 2022-02-19 DIAGNOSIS — E785 Hyperlipidemia, unspecified: Secondary | ICD-10-CM | POA: Diagnosis present

## 2022-02-19 DIAGNOSIS — Z20822 Contact with and (suspected) exposure to covid-19: Secondary | ICD-10-CM | POA: Diagnosis present

## 2022-02-19 DIAGNOSIS — E7849 Other hyperlipidemia: Secondary | ICD-10-CM | POA: Diagnosis not present

## 2022-02-19 DIAGNOSIS — J441 Chronic obstructive pulmonary disease with (acute) exacerbation: Secondary | ICD-10-CM | POA: Diagnosis present

## 2022-02-19 DIAGNOSIS — L89321 Pressure ulcer of left buttock, stage 1: Secondary | ICD-10-CM | POA: Diagnosis present

## 2022-02-19 LAB — CBC WITH DIFFERENTIAL/PLATELET
Abs Immature Granulocytes: 0.03 10*3/uL (ref 0.00–0.07)
Basophils Absolute: 0.1 10*3/uL (ref 0.0–0.1)
Basophils Relative: 1 %
Eosinophils Absolute: 0.1 10*3/uL (ref 0.0–0.5)
Eosinophils Relative: 1 %
HCT: 36.2 % (ref 36.0–46.0)
Hemoglobin: 12.4 g/dL (ref 12.0–15.0)
Immature Granulocytes: 0 %
Lymphocytes Relative: 20 %
Lymphs Abs: 1.9 10*3/uL (ref 0.7–4.0)
MCH: 29.5 pg (ref 26.0–34.0)
MCHC: 34.3 g/dL (ref 30.0–36.0)
MCV: 86 fL (ref 80.0–100.0)
Monocytes Absolute: 1.2 10*3/uL — ABNORMAL HIGH (ref 0.1–1.0)
Monocytes Relative: 13 %
Neutro Abs: 5.9 10*3/uL (ref 1.7–7.7)
Neutrophils Relative %: 65 %
Platelets: 241 10*3/uL (ref 150–400)
RBC: 4.21 MIL/uL (ref 3.87–5.11)
RDW: 14.3 % (ref 11.5–15.5)
WBC: 9.2 10*3/uL (ref 4.0–10.5)
nRBC: 0 % (ref 0.0–0.2)

## 2022-02-19 LAB — COMPREHENSIVE METABOLIC PANEL
ALT: 19 U/L (ref 0–44)
ALT: 19 U/L (ref 0–44)
AST: 49 U/L — ABNORMAL HIGH (ref 15–41)
AST: 46 U/L — ABNORMAL HIGH (ref 15–41)
Albumin: 2.8 g/dL — ABNORMAL LOW (ref 3.5–5.0)
Albumin: 2.5 g/dL — ABNORMAL LOW (ref 3.5–5.0)
Alkaline Phosphatase: 100 U/L (ref 38–126)
Alkaline Phosphatase: 87 U/L (ref 38–126)
Anion gap: 18 — ABNORMAL HIGH (ref 5–15)
Anion gap: 12 (ref 5–15)
BUN: 19 mg/dL (ref 8–23)
BUN: 19 mg/dL (ref 8–23)
CO2: 30 mmol/L (ref 22–32)
CO2: 33 mmol/L — ABNORMAL HIGH (ref 22–32)
Calcium: 6.9 mg/dL — ABNORMAL LOW (ref 8.9–10.3)
Calcium: 6.9 mg/dL — ABNORMAL LOW (ref 8.9–10.3)
Chloride: 84 mmol/L — ABNORMAL LOW (ref 98–111)
Chloride: 86 mmol/L — ABNORMAL LOW (ref 98–111)
Creatinine, Ser: 1.8 mg/dL — ABNORMAL HIGH (ref 0.44–1.00)
Creatinine, Ser: 1.46 mg/dL — ABNORMAL HIGH (ref 0.44–1.00)
GFR, Estimated: 28 mL/min — ABNORMAL LOW (ref >60)
GFR, Estimated: 36 mL/min — ABNORMAL LOW (ref >60)
Glucose, Bld: 127 mg/dL — ABNORMAL HIGH (ref 70–99)
Glucose, Bld: 184 mg/dL — ABNORMAL HIGH (ref 70–99)
Potassium: <2 mmol/L — CL (ref 3.5–5.1)
Potassium: 2.3 mmol/L — CL (ref 3.5–5.1)
Sodium: 132 mmol/L — ABNORMAL LOW (ref 135–145)
Sodium: 131 mmol/L — ABNORMAL LOW (ref 135–145)
Total Bilirubin: 1.4 mg/dL — ABNORMAL HIGH (ref 0.3–1.2)
Total Bilirubin: 1.7 mg/dL — ABNORMAL HIGH (ref 0.3–1.2)
Total Protein: 7 g/dL (ref 6.5–8.1)
Total Protein: 6.3 g/dL — ABNORMAL LOW (ref 6.5–8.1)

## 2022-02-19 LAB — BRAIN NATRIURETIC PEPTIDE: B Natriuretic Peptide: 115.7 pg/mL — ABNORMAL HIGH (ref 0.0–100.0)

## 2022-02-19 LAB — POTASSIUM: Potassium: 3.6 mmol/L (ref 3.5–5.1)

## 2022-02-19 LAB — SARS CORONAVIRUS 2 BY RT PCR: SARS Coronavirus 2 by RT PCR: NEGATIVE

## 2022-02-19 LAB — TROPONIN I (HIGH SENSITIVITY)
Troponin I (High Sensitivity): 21 ng/L — ABNORMAL HIGH (ref <18)
Troponin I (High Sensitivity): 21 ng/L — ABNORMAL HIGH (ref <18)

## 2022-02-19 LAB — GLUCOSE, CAPILLARY: Glucose-Capillary: 195 mg/dL — ABNORMAL HIGH (ref 70–99)

## 2022-02-19 LAB — GROUP A STREP BY PCR: Group A Strep by PCR: NOT DETECTED

## 2022-02-19 LAB — MAGNESIUM
Magnesium: 1.3 mg/dL — ABNORMAL LOW (ref 1.7–2.4)
Magnesium: 1.9 mg/dL (ref 1.7–2.4)

## 2022-02-19 MED ORDER — PROMETHAZINE HCL 25 MG PO TABS: 12.5000 mg | ORAL_TABLET | Freq: Four times a day (QID) | ORAL | Status: DC | PRN

## 2022-02-19 MED ORDER — METHYLPREDNISOLONE SODIUM SUCC 125 MG IJ SOLR
80.0000 mg | INTRAMUSCULAR | Status: DC
  Administered 2022-02-19 – 2022-02-20 (×2): 80 mg via INTRAVENOUS
  Filled 2022-02-19 (×2): qty 2

## 2022-02-19 MED ORDER — POTASSIUM CHLORIDE 10 MEQ/100ML IV SOLN
10.0000 meq | Freq: Once | INTRAVENOUS | Status: DC
  Administered 2022-02-19: 10 meq via INTRAVENOUS
  Filled 2022-02-19: qty 100

## 2022-02-19 MED ORDER — ACETAMINOPHEN 650 MG RE SUPP: 650.0000 mg | Freq: Four times a day (QID) | RECTAL | Status: DC | PRN

## 2022-02-19 MED ORDER — POTASSIUM CHLORIDE 10 MEQ/100ML IV SOLN
10.0000 meq | Freq: Once | INTRAVENOUS | Status: AC
  Administered 2022-02-19: 10 meq via INTRAVENOUS
  Filled 2022-02-19: qty 100

## 2022-02-19 MED ORDER — ACETAMINOPHEN 325 MG PO TABS
650.0000 mg | ORAL_TABLET | Freq: Four times a day (QID) | ORAL | Status: DC | PRN
  Administered 2022-02-19 – 2022-02-20 (×2): 650 mg via ORAL
  Filled 2022-02-19 (×2): qty 2

## 2022-02-19 MED ORDER — POTASSIUM CHLORIDE 20 MEQ PO PACK
40.0000 meq | PACK | Freq: Three times a day (TID) | ORAL | Status: AC
  Administered 2022-02-19 (×2): 40 meq via ORAL
  Filled 2022-02-19: qty 2

## 2022-02-19 MED ORDER — MORPHINE SULFATE (PF) 2 MG/ML IV SOLN: 2.0000 mg | INTRAVENOUS | Status: DC | PRN

## 2022-02-19 MED ORDER — CHLORHEXIDINE GLUCONATE CLOTH 2 % EX PADS
6.0000 | MEDICATED_PAD | Freq: Every day | CUTANEOUS | Status: DC
  Administered 2022-02-19: 6 via TOPICAL

## 2022-02-19 MED ORDER — SODIUM CHLORIDE 0.9 % IV BOLUS
500.0000 mL | Freq: Once | INTRAVENOUS | Status: AC
  Administered 2022-02-19: 500 mL via INTRAVENOUS

## 2022-02-19 MED ORDER — HEPARIN SODIUM (PORCINE) 5000 UNIT/ML IJ SOLN
5000.0000 [IU] | Freq: Three times a day (TID) | INTRAMUSCULAR | Status: DC
  Administered 2022-02-19 – 2022-02-21 (×7): 5000 [IU] via SUBCUTANEOUS
  Filled 2022-02-19 (×6): qty 1

## 2022-02-19 MED ORDER — MAGIC MOUTHWASH
10.0000 mL | Freq: Once | ORAL | Status: AC
  Administered 2022-02-19: 10 mL via ORAL
  Filled 2022-02-19: qty 10

## 2022-02-19 MED ORDER — OXYCODONE HCL 5 MG PO TABS: 5.0000 mg | ORAL_TABLET | ORAL | Status: DC | PRN

## 2022-02-19 MED ORDER — METHYLPREDNISOLONE SODIUM SUCC 125 MG IJ SOLR: 125.0000 mg | Freq: Every day | INTRAMUSCULAR | Status: DC

## 2022-02-19 MED ORDER — MAGNESIUM SULFATE 2 GM/50ML IV SOLN
2.0000 g | Freq: Once | INTRAVENOUS | Status: AC
  Administered 2022-02-19: 2 g via INTRAVENOUS
  Filled 2022-02-19: qty 50

## 2022-02-19 MED ORDER — IPRATROPIUM BROMIDE 0.02 % IN SOLN
0.5000 mg | Freq: Once | RESPIRATORY_TRACT | Status: AC
  Administered 2022-02-19: 0.5 mg via RESPIRATORY_TRACT
  Filled 2022-02-19: qty 2.5

## 2022-02-19 MED ORDER — POTASSIUM CHLORIDE CRYS ER 20 MEQ PO TBCR
30.0000 meq | EXTENDED_RELEASE_TABLET | Freq: Once | ORAL | Status: AC
Start: 2022-02-19 — End: 2022-02-19
  Administered 2022-02-19: 30 meq via ORAL
  Filled 2022-02-19: qty 2

## 2022-02-19 MED ORDER — BUDESONIDE 0.5 MG/2ML IN SUSP
2.0000 mg | Freq: Two times a day (BID) | RESPIRATORY_TRACT | Status: DC
Start: 2022-02-19 — End: 2022-02-21
  Administered 2022-02-19 – 2022-02-20 (×2): 2 mg via RESPIRATORY_TRACT
  Administered 2022-02-20: 0.5 mg via RESPIRATORY_TRACT
  Administered 2022-02-21: 1 mg via RESPIRATORY_TRACT
  Filled 2022-02-19 (×5): qty 8

## 2022-02-19 MED ORDER — PHENOL 1.4 % MT LIQD
1.0000 | OROMUCOSAL | Status: DC | PRN
Start: 2022-02-19 — End: 2022-02-21
  Administered 2022-02-19 – 2022-02-20 (×2): 1 via OROMUCOSAL
  Filled 2022-02-19: qty 177

## 2022-02-19 MED ORDER — METHYLPREDNISOLONE SODIUM SUCC 125 MG IJ SOLR
INTRAMUSCULAR | Status: AC
  Filled 2022-02-19: qty 2

## 2022-02-19 MED ORDER — METHYLPREDNISOLONE SODIUM SUCC 125 MG IJ SOLR
125.0000 mg | Freq: Once | INTRAMUSCULAR | Status: AC
  Administered 2022-02-19: 125 mg via INTRAVENOUS
  Filled 2022-02-19: qty 2

## 2022-02-19 MED ORDER — POTASSIUM CHLORIDE 10 MEQ/100ML IV SOLN
10.0000 meq | INTRAVENOUS | Status: AC
  Administered 2022-02-19 (×3): 10 meq via INTRAVENOUS
  Filled 2022-02-19 (×3): qty 100

## 2022-02-19 NOTE — Assessment & Plan Note (Addendum)
-   Continue lisinopril -Continue to monitor

## 2022-02-19 NOTE — Assessment & Plan Note (Signed)
-   Albumin 2.9 -= Patient admits to decreased p.o. intake -Encourage nutrient dense food choices -Continue to monitor

## 2022-02-19 NOTE — Assessment & Plan Note (Signed)
Continue Crestor 

## 2022-02-19 NOTE — Assessment & Plan Note (Signed)
-   Potassium less than 2.0 -40 mEq of potassium ordered in the ED -40 mEq more ordered at admission p.o. -Recheck and replace as indicated

## 2022-02-19 NOTE — Progress Notes (Signed)
Patient admitted early hours of the morning with acute respiratory distress requiring BiPAP. Currently she is on room air. Chart reviewed. Patient recently was admitted [facility] with diarrhea. She was discharged to home. At potassium of less than 2.0. Getting IV and PO runs. Potassium 2.3. Patient overall feeling better. She will move out of ICU once potassium is replaced. Physical therapy, occupational therapy, TOC for discharge planning.

## 2022-02-19 NOTE — Assessment & Plan Note (Signed)
-   Patient reported hypoxic prior to arrival -Patient arrived on CPAP with EMS, was transferred to BiPAP upon arrival, was not able to tolerate and so then was started on high flow nasal cannula -Patient tolerating high flow nasal cannula well -ABG shows a pH of 7.7, PCO2 26, PO2 74 -Is breathing more comfortably than reported at arrival and she is no longer tripoding or using accessory muscles -Patient does have a hoarse voice, Chloraseptic ordered for sore throat -Continue to monitor

## 2022-02-19 NOTE — ED Triage Notes (Signed)
Pt arrived via [facility] from home with reports of shortness of breath off and on since  yesterday morning. Pt has hx of asthma, allergy to albuterol inhaler, uses atrovent.  Pt arrived to ED on CPAP, used personal atrovent inhaler enroute with EMS.

## 2022-02-19 NOTE — Assessment & Plan Note (Signed)
-   Creatinine baseline 0.9, creatinine today 1.80 -Secondary to poor p.o. intake/minimal dehydration -500 mL bolus ordered -Continue to encourage p.o. intake -Recheck in the a.m.

## 2022-02-19 NOTE — Progress Notes (Signed)
Pharmacy Electrolyte Monitoring Consult:  Pharmacy consulted to assist in monitoring and replacing electrolytes in this 81 y.o. female admitted on 02/19/2022 with Shortness of Breath   Labs:  Sodium (mmol/L)  Date Value  02/19/2022 131 (L)  10/11/2013 132 (L)   Potassium (mmol/L)  Date Value  02/19/2022 2.3 (LL)  10/11/2013 3.6   Magnesium (mg/dL)  Date Value  02/19/2022 1.3 (L)  10/11/2013 1.5 (L)   Phosphorus (mg/dL)  Date Value  11/21/2021 2.5   Calcium (mg/dL)  Date Value  02/19/2022 6.9 (L)   Calcium, Total (mg/dL)  Date Value  10/11/2013 9.4   Albumin (g/dL)  Date Value  02/19/2022 2.5 (L)  10/09/2013 3.1 (L)   -on torsemide, KCL PTA  Assessment/Plan: -patient has received IV KCL 10 meq x 3 doses ordered by NP -KCL PO 30 meq  x 1 @ 0735 ordered by NP -Magnesium 2 gm IV x 1 given @ 0353, repeated at 1043  40 mEq oral KCl x 2 10 mEq IV KCl x 3 F/u electrolytes at 1800  Dallie Piles 02/19/2022 11:15 AM

## 2022-02-19 NOTE — Plan of Care (Signed)

## 2022-02-19 NOTE — ED Provider Notes (Signed)
[facility] Provider Note    Event Date/Time   First MD Initiated Contact with Patient 02/19/22 [phone]     (approximate)   History   Shortness of Breath   HPI  Kendra Clarke is a 81 y.o. female brought to the ED via EMS from home with a chief complaint of shortness of breath.  Patient has a history of COPD who is allergic to albuterol.  Uses Atrovent only.  Also think she is allergic to Solu-Medrol and magnesium so asked EMS to hold off.  Arrives to the ED on CPAP.  Endorses cough.  Denies fever, chills, chest pain, abdominal pain, nausea, vomiting or dizziness.     Past Medical History   Past Medical History:  Diagnosis Date   Arthritis    Asthma    COPD (chronic obstructive pulmonary disease) ([place])    Diabetes mellitus without complication ([place])    Hypertension    Liver disease    Umbilical hernia      Active Problem List   Patient Active Problem List   Diagnosis Date Noted   Acute respiratory failure with hypoxia ([place]) 02/19/2022   Hypoxia    Edema    Abdominal pain    Bronchitis 11/12/2021   HTN (hypertension) 11/12/2021   HLD (hyperlipidemia) 11/12/2021   COPD exacerbation ([place]) 11/12/2021   Diabetes mellitus without complication ([place]) [id number]   Fall at home, initial encounter 11/12/2021   Hyponatremia 11/12/2021   Hypokalemia 11/12/2021   Disorder of electrolytes 11/12/2021   Elevated troponin 11/12/2021   Nausea & vomiting 11/12/2021   Hypomagnesemia 11/12/2021   Acute respiratory disease due to COVID-19 virus 11/12/2021   Varicose veins of leg with pain, 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 Authorizing Provider  amLODipine (NORVASC) 10 MG tablet Take 10 mg by mouth daily. 09/24/17  Yes [provider]  ATROVENT HFA 17 MCG/ACT inhaler Inhale into the lungs. 02/16/22  Yes [provider]  Dulaglutide (TRULICITY) [id number] SOPN Inject 0.75 mg into the skin once a week. 12/16/18  Yes [provider]  lisinopril (ZESTRIL) 20 MG tablet Take by mouth. 09/03/18  Yes [provider]  potassium chloride (KLOR-CON) 10 MEQ tablet Take 30 mEq by mouth daily. 02/13/22  Yes [provider]  SYMBICORT 160-4.5 MCG/ACT inhaler INHALE 2 PUFFS TWICE DAILY FOR ASTHMA 12/02/20  Yes [provider]  torsemide (DEMADEX) 20 MG tablet Take 40 mg by mouth daily. 02/13/22  Yes [provider]  atenolol (TENORMIN) 25 MG tablet Take 1 tablet (25 mg total) by mouth daily. 11/21/21 12/21/21  Richarda Osmond, MD  furosemide (LASIX) 20 MG tablet Take 0.5 tablets (10 mg total) by mouth 2 (two) times daily. 11/21/21 12/21/21  Richarda Osmond, MD  hydrochlorothiazide (MICROZIDE) 12.5 MG capsule Take 12.5 mg by mouth daily. Patient not taking: Reported on 02/19/2022 12/06/21   [provider]  metFORMIN (GLUCOPHAGE) 1000 MG tablet Take 1,000 mg by mouth 2 (two) times daily. Patient  not taking: Reported on 02/19/2022 12/06/21   [provider]  methimazole (TAPAZOLE) 5 MG tablet Take 1 tablet (5 mg total) by mouth daily. 11/21/21 12/21/21  Richarda Osmond, MD  Multiple Vitamin (MULTIVITAMIN WITH MINERALS) TABS tablet Take 1 tablet by mouth daily. Patient not taking: Reported on 02/19/2022 12/04/17   Salary, Holly Bodily D, MD  ondansetron (ZOFRAN) 8 MG tablet Take 8 mg by mouth 3 (three) times daily. Patient not taking: Reported on 02/19/2022 02/13/22   [provider]  rosuvastatin (CRESTOR) 20 MG tablet Take 20 mg by mouth at bedtime. Patient not  taking: Reported on 02/19/2022 12/06/21   [provider]     Allergies  Albuterol, Codeine, Tramadol, and Sulfa antibiotics   Family History   Family History  Problem Relation Age of Onset   Multiple sclerosis Mother    Hypertension Father    COPD Father    Varicose Veins Maternal Aunt    Stroke Nephew      Physical Exam  Triage Vital Signs: ED Triage Vitals  Enc Vitals Group     BP      Pulse      Resp      Temp      Temp src      SpO2      Weight      Height      Head Circumference      Peak Flow      Pain Score      Pain Loc      Pain Edu?      Excl. in [place]?     Updated Vital Signs: BP (!) 153/73 (BP Location: Left Arm)   Pulse (!) 111   Temp 97.9 F (36.6 C) (Oral)   Resp (!) 22   SpO2 100%    General: Awake, moderate distress.  CV:  Tachycardic.  Good peripheral perfusion.  Resp:  Increased effort.  Tripoding.  Wheezing, retractions, diminished aeration. Abd:  Nontender.  No distention.  Other:  Calves are nonswollen and nontender   ED Results / Procedures / Treatments  Labs (all labs ordered are listed, but only abnormal results are displayed) Labs Reviewed  CBC WITH DIFFERENTIAL/PLATELET - Abnormal; Notable for the following components:      Result Value   Monocytes Absolute 1.2 (*)    All other components within normal limits  COMPREHENSIVE METABOLIC PANEL - Abnormal; Notable for the following components:   Sodium 132 (*)    Potassium <2.0 (*)    Chloride 84 (*)    Glucose, Bld 127 (*)    Creatinine, Ser 1.80 (*)    Calcium 6.9 (*)    Albumin 2.8 (*)    AST 49 (*)    Total Bilirubin 1.4 (*)    GFR, Estimated 28 (*)    Anion gap 18 (*)    All other components within normal limits  BRAIN NATRIURETIC PEPTIDE - Abnormal; Notable for the following components:   B Natriuretic Peptide 115.7 (*)    All other components within normal limits  BLOOD GAS, ARTERIAL - Abnormal; Notable for the following components:   pH, Arterial 7.7  (*)    pCO2 arterial 26 (*)    pO2, Arterial 74 (*)    Bicarbonate 32.1 (*)    Acid-Base Excess 12.5 (*)    All other components within normal limits  TROPONIN I (HIGH SENSITIVITY) - Abnormal; Notable for the following components:   Troponin I (High Sensitivity) 21 (*)  All other components within normal limits  GROUP A STREP BY PCR  SARS CORONAVIRUS 2 BY RT PCR  COMPREHENSIVE METABOLIC PANEL  TROPONIN I (HIGH SENSITIVITY)     EKG  ED ECG REPORT I, Jerime Arif J, the attending physician, personally viewed and interpreted this ECG.   Date: 02/19/2022  EKG Time: 0326  Rate: 109  Rhythm: sinus tachycardia  Axis: RAD  Intervals:none  ST&T Change: Nonspecific    RADIOLOGY I have independently visualized and interpreted patient's chest x-ray as well as noted the radiology interpretation:  Chest x-ray: No acute cardiopulmonary process  Official radiology report(s): DG Chest Port 1 View  Result Date: 02/19/2022 CLINICAL DATA:  Shortness of breath EXAM: PORTABLE CHEST 1 VIEW COMPARISON:  11/18/2021 FINDINGS: Normal cardiac size. No focal opacity or pleural effusion. Aortic atherosclerosis. No pneumothorax. IMPRESSION: No active disease. Electronically Signed   By: Donavan Foil M.D.   On: 02/19/2022 03:51     PROCEDURES:  Critical Care performed: Yes, see critical care procedure note(s)  CRITICAL CARE Performed by: Paulette Blanch   Total critical care time: 45 minutes  Critical care time was exclusive of separately billable procedures and treating other patients.  Critical care was necessary to treat or prevent imminent or life-threatening deterioration.  Critical care was time spent personally by me on the following activities: development of treatment plan with patient and/or surrogate as well as nursing, discussions with consultants, evaluation of patient's response to treatment, examination of patient, obtaining history from patient or surrogate, ordering and performing  treatments and interventions, ordering and review of laboratory studies, ordering and review of radiographic studies, pulse oximetry and re-evaluation of patient's condition.   Marland Kitchen1-3 Lead EKG Interpretation Performed by: Paulette Blanch, MD Authorized by: Paulette Blanch, MD     Interpretation: abnormal     ECG rate:  111   ECG rate assessment: tachycardic     Rhythm: sinus tachycardia     Ectopy: none     Conduction: normal   Comments:     Patient placed on cardiac monitor to evaluate for arrhythmias   MEDICATIONS ORDERED IN ED: Medications  magic mouthwash (has no administration in time range)  potassium chloride 10 mEq in 100 mL IVPB (has no administration in time range)  potassium chloride 10 mEq in 100 mL IVPB (has no administration in time range)  potassium chloride 10 mEq in 100 mL IVPB (has no administration in time range)  potassium chloride 10 mEq in 100 mL IVPB (has no administration in time range)  phenol (CHLORASEPTIC) mouth spray 1 spray (has no administration in time range)  ipratropium (ATROVENT) nebulizer solution 0.5 mg (0.5 mg Nebulization Given 02/19/22 0345)  methylPREDNISolone sodium succinate (SOLU-MEDROL) 125 mg/2 mL injection 125 mg (125 mg Intravenous Given 02/19/22 0352)  magnesium sulfate IVPB 2 g 50 mL (2 g Intravenous New Bag/Given 02/19/22 0353)     IMPRESSION / MDM / [place] / ED COURSE  I reviewed the triage vital signs and the nursing notes.                             81 year old female presenting with shortness of breath.  I have identified this patient to have a potentially life-threatening condition. Differential includes, but is not limited to, viral syndrome, bronchitis including COPD exacerbation, pneumonia, reactive airway disease including asthma, CHF including exacerbation with or without pulmonary/interstitial edema, pneumothorax, ACS, thoracic trauma, and pulmonary embolism.  I have personally reviewed patient's records and see that  she had a recent admission on 01/26/2022 for acute on chronic congestive heart failure.  The patient is on the cardiac monitor to evaluate for evidence of arrhythmia and/or significant heart rate changes.  I have personally reviewed patient's allergies and she does not have magnesium nor Solu-Medrol as listed allergies.  We will go ahead and administer these.  We will obtain cardiac panel, COVID swab, chest x-ray.  Anticipate hospitalization.  Clinical Course as of 02/19/22 0524  Mon Feb 19, 2022  0351 Patient having some upper airway squeaking, bordering on stridor.  Oropharynx is edentulous with moderate erythema and blistering.  Will obtain rapid strep, administer Magic mouthwash.  Patient is actually doing well off BiPAP; will trial high flow oxygen. [JS]  0421 High flow 35% 30L - patient tolerating well on cool mist. [JS]  0432 Laboratory results demonstrate normal WBC, severe hypokalemia less than 2 with anion gap 18 which is most likely secondary to respiratory acidosis rather than DKA.  Creatinine elevated demonstrating AKI.  Troponin mildly elevated.  We will replete potassium via IV.  Will consult hospitalist services for evaluation and admission. [JS]  [id number] Patient states she was recently diagnosed with low potassium and was supposed to start potassium tablets today. [JS]    Clinical Course User Index [JS] Paulette Blanch, MD     FINAL CLINICAL IMPRESSION(S) / ED DIAGNOSES   Final diagnoses:  COPD exacerbation ([place])  Hypokalemia  Stomatitis     Rx / DC Orders   ED Discharge Orders     None        Note:  This document was prepared using Dragon voice recognition software and may include unintentional dictation errors.   Paulette Blanch, MD 02/19/22 [phone]

## 2022-02-19 NOTE — Progress Notes (Addendum)
Pharmacy Electrolyte Monitoring Consult:  Pharmacy consulted to assist in monitoring and replacing electrolytes in this 81 y.o. female admitted on 02/19/2022 with Shortness of Breath   Labs:  Sodium (mmol/L)  Date Value  02/19/2022 132 (L)  10/11/2013 132 (L)   Potassium (mmol/L)  Date Value  02/19/2022 <2.0 (LL)  10/11/2013 3.6   Magnesium (mg/dL)  Date Value  62/83/1517 1.5 (L)  10/11/2013 1.5 (L)   Phosphorus (mg/dL)  Date Value  61/60/7371 2.5   Calcium (mg/dL)  Date Value  03/12/9484 6.9 (L)   Calcium, Total (mg/dL)  Date Value  46/27/0350 9.4   Albumin (g/dL)  Date Value  09/38/1829 2.8 (L)  10/09/2013 3.1 (L)   -on torsemide, KCL PTA  Assessment/Plan: 02/19/22 @ 0340    K<2.0  Scr 1.80 -patient has received IV KCL 10 meq x 3 doses so far(0546,0654,0748)ordered by NP -KCL PO 30 meq  x 1 @ 0735 ordered by NP -Magnesium 2 gm IV x 1 given @ 0353   Mag level @0749 = 1.3 -will order Magnesium sulfate 2 gm IV x1    CMP ordered for 02/19/22 1000. Will follow up and replace as needed F/u electrolytes in am  Alveta Quintela A 02/19/2022 8:23 AM

## 2022-02-19 NOTE — H&P (Signed)
History and Physical    Patient: Kendra GoslingOdelia A Vogt ZOX:[id number] DOB: 05/21/1941 DOA: 02/19/2022 DOS: the patient was seen and examined on 02/19/2022 PCP: Shane CrutchMarkley, Linda, PA  Patient coming from: Home  Chief Complaint:  Chief Complaint  Patient presents with   Shortness of Breath   HPI: Kendra Clarke is a 81 y.o. female with medical history significant of asthma, COPD, diabetes mellitus type 2, hypertension, liver disease, and more presents ED with a chief complaint of dyspnea.  Patient reports that she has had trouble breathing since she woke up on 4 June.  Its been worse since it started.  She has a cough that is productive.  She denies fever.  She has hoarse voice that started at the same time as the dyspnea.  She did have squeezing chest pains that are worse with exertion and better with rest.  Patient also admits to wheezing and reports that she has tried to use her Atrovent inhaler, with minimal relief.  She denies palpitations.  Patient is a sore throat but attributes it to cough.  She denies any other acute complaints at this time.  Patient does report that she had the beginnings of bedsores from her last hospitalization in [place].  She reports she put powder on them and they are resolving.  Patient does not smoke, does not drink alcohol, does not use illicit drugs.  She is not vaccinated for COVID but she has had COVID.  Patient is DNR. Review of Systems: As mentioned in the history of present illness. All other systems reviewed and are negative. Past Medical History:  Diagnosis Date   Arthritis    Asthma    COPD (chronic obstructive pulmonary disease) (HCC)    Diabetes mellitus without complication (HCC)    Hypertension    Liver disease    Umbilical hernia    Past Surgical History:  Procedure Laterality Date   ABDOMINAL HYSTERECTOMY     BLADDER SURGERY     CHOLECYSTECTOMY     nose surgery     Social History:  reports that she has never smoked. She has never used  smokeless tobacco. She reports that she does not drink alcohol and does not use drugs.  Allergies  Allergen Reactions   Albuterol Anaphylaxis and Rash    Other reaction(s): Other (See Comments) Patient states it gives her an asthma attack Other reaction(s): Other (See Comments), Respiratory Distress Patient states it gives her an asthma attack  Other reaction(s): Other (See Comments) Patient states it gives her an asthma attack    Codeine Anxiety and Shortness Of Breath   Tramadol Hives   Sulfa Antibiotics Rash    Family History  Problem Relation Age of Onset   Multiple sclerosis Mother    Hypertension Father    COPD Father    Varicose Veins Maternal Aunt    Stroke Nephew     Prior to Admission medications   Medication Sig Start Date End Date Taking? Authorizing Provider  amLODipine (NORVASC) 10 MG tablet Take 10 mg by mouth daily. 09/24/17  Yes [provider]  ATROVENT HFA 17 MCG/ACT inhaler Inhale into the lungs. 02/16/22  Yes [provider]  Dulaglutide (TRULICITY) 0.75 MG/0.5ML SOPN Inject 0.75 mg into the skin once a week. 12/16/18  Yes [provider]  lisinopril (ZESTRIL) 20 MG tablet Take by mouth. 09/03/18  Yes [provider]  potassium chloride (KLOR-CON) 10 MEQ tablet Take 30 mEq by mouth daily. 02/13/22  Yes [provider]  [facility]  160-4.5 MCG/ACT inhaler INHALE 2 PUFFS TWICE DAILY FOR ASTHMA 12/02/20  Yes [provider]  torsemide (DEMADEX) 20 MG tablet Take 40 mg by mouth daily. 02/13/22  Yes [provider]  atenolol (TENORMIN) 25 MG tablet Take 1 tablet (25 mg total) by mouth daily. 11/21/21 12/21/21  Leeroy Bock, MD  furosemide (LASIX) 20 MG tablet Take 0.5 tablets (10 mg total) by mouth 2 (two) times daily. 11/21/21 12/21/21  Leeroy Bock, MD  hydrochlorothiazide (MICROZIDE) 12.5 MG capsule Take 12.5 mg by mouth daily. Patient not taking: Reported on 02/19/2022 12/06/21   [provider]   metFORMIN (GLUCOPHAGE) 1000 MG tablet Take 1,000 mg by mouth 2 (two) times daily. Patient not taking: Reported on 02/19/2022 12/06/21   [provider]  methimazole (TAPAZOLE) 5 MG tablet Take 1 tablet (5 mg total) by mouth daily. 11/21/21 12/21/21  Leeroy Bock, MD  Multiple Vitamin (MULTIVITAMIN WITH MINERALS) TABS tablet Take 1 tablet by mouth daily. Patient not taking: Reported on 02/19/2022 12/04/17   Salary, Jetty Duhamel D, MD  ondansetron (ZOFRAN) 8 MG tablet Take 8 mg by mouth 3 (three) times daily. Patient not taking: Reported on 02/19/2022 02/13/22   [provider]  rosuvastatin (CRESTOR) 20 MG tablet Take 20 mg by mouth at bedtime. Patient not taking: Reported on 02/19/2022 12/06/21   [provider]    Physical Exam: Vitals:   02/19/22 0339 02/19/22 0357 02/19/22 0530  BP: (!) 153/73  (!) 133/58  Pulse: (!) 111  92  Resp: (!) 22  15  Temp:  97.9 F (36.6 C)   TempSrc:  Oral   SpO2: 100%  100%   1.  General: Patient lying supine in bed,  no acute distress   2. Psychiatric: Alert and oriented x 3, mood and behavior normal for situation, pleasant and cooperative with exam   3. Neurologic: Speech and language are normal, face is symmetric, moves all 4 extremities voluntarily, at baseline without acute deficits on limited exam   4. HEENMT:  Head is atraumatic, normocephalic, pupils reactive to light, neck is supple, trachea is midline, mucous membranes are moist   5. Respiratory : Wheezing in the bilateral lung fields without rhonchi, rales, cyanosis, high flow nasal cannula in place   6. Cardiovascular : Heart rate normal, rhythm is regular, murmur present, rubs or gallops, peripheral edema greater on right than left, peripheral pulses palpated   7. Gastrointestinal:  Abdomen is soft, nondistended, tender over ventral hernia, bowel sounds active, no masses or organomegaly palpated   8. Skin:  Skin is warm, dry and intact without rashes, acute  lesions, or ulcers on limited exam   9.Musculoskeletal:  No acute deformities or trauma, no asymmetry in tone, peripheral pulses palpated, no tenderness to palpation in the extremities  Data Reviewed: In the ED Temp 97.9, heart rate 111, respiratory rate 22, blood pressure 153/73, satting normally on 35 L high flow nasal cannula No leukocytosis with a white blood cell count of 9.2, hemoglobin 12.4, platelets 241 Chemistry reveals a potassium is undetectable, and a creatinine 1.80 with a bicarb of 30 Albumin 2.9, AST 49, ALT 19, T. bili 1.4, BNP 115, troponin 21 Strep negative Chest x-ray shows no active disease EKG shows sinus tach, heart rate 109, QTc 403, COVID-negative Atrovent, Magic mouthwash, mag sulfate, Solu-Medrol, 40 mEq potassium given in the ED Admission requested for further management of COPD exacerbation in a patient who refuses beta agonist Assessment and Plan: * Acute respiratory failure  with hypoxia (HCC) - Patient reported hypoxic prior to arrival -Patient arrived on CPAP with EMS, was transferred to BiPAP upon arrival, was not able to tolerate and so then was started on high flow nasal cannula -Patient tolerating high flow nasal cannula well -ABG shows a pH of 7.7, PCO2 26, PO2 74 -Is breathing more comfortably than reported at arrival and she is no longer tripoding or using accessory muscles -Patient does have a hoarse voice, Chloraseptic ordered for sore throat -Continue to monitor  AKI (acute kidney injury) (HCC) - Creatinine baseline 0.9, creatinine today 1.80 -Secondary to poor p.o. intake/minimal dehydration -500 mL bolus ordered -Continue to encourage p.o. intake -Recheck in the a.m.  Protein-calorie malnutrition, moderate (HCC) - Albumin 2.9 -= Patient admits to decreased p.o. intake -Encourage nutrient dense food choices -Continue to monitor  Hypokalemia - Potassium less than 2.0 -40 mEq of potassium ordered in the ED -40 mEq more ordered at  admission p.o. -Recheck and replace as indicated  COPD exacerbation (HCC) - History of COPD -Presents with wheeze but refuses albuterol-thinks she is allergic to it -BiPAP started in ED but patient was not able to tolerate it due to sore throat -Continue high flow nasal cannula -VBG shows pH 7.7, PCO2 26, PO2 74 -Continue Atrovent as needed for wheezing and shortness of breath -Continue Symbicort -Strep negative -Chest x-ray shows no active disease -Solu-Medrol started in the ED, continue Solu-Medrol -Continue to monitor  HLD (hyperlipidemia) Continue Crestor  HTN (hypertension) - Continue lisinopril -Continue to monitor      Advance Care Planning:   Code Status: Prior DNR  Consults: None  Family Communication: No family at bedside  Severity of Illness: The appropriate patient status for this patient is INPATIENT. Inpatient status is judged to be reasonable and necessary in order to provide the required intensity of service to ensure the patient's safety. The patient's presenting symptoms, physical exam findings, and initial radiographic and laboratory data in the context of their chronic comorbidities is felt to place them at high risk for further clinical deterioration. Furthermore, it is not anticipated that the patient will be medically stable for discharge from the hospital within 2 midnights of admission.   * I certify that at the point of admission it is my clinical judgment that the patient will require inpatient hospital care spanning beyond 2 midnights from the point of admission due to high intensity of service, high risk for further deterioration and high frequency of surveillance required.*  Author: Lilyan Gilford, DO 02/19/2022 6:21 AM  For on call review [email or web address].

## 2022-02-19 NOTE — Progress Notes (Signed)
Patient arrived via ems on cpap. Placed patient on bipap per Dr Dolores Frame. Patient with loud unusual noises from airway. C/O intense throat pain since yesterday with nonproductive cough. Per Dr Dolores Frame evaluation, throat noted to be very red and irritated  Taken off bipap and placed on cool mist high flow 35% 30 L due to irritation. Patient very talkative. C/O sob. Advised patient to relax and allow oxygen flow to help with breathing. Turned heat on to see if such would help as well. Will continue to monitor

## 2022-02-19 NOTE — ED Notes (Signed)
K <2 per lab.  Per pt she was supp[osed ot start taking 3 K pills and 2 'diuretic' pills this morning.  EDP Sung notified.

## 2022-02-19 NOTE — Assessment & Plan Note (Signed)
-   History of COPD -Presents with wheeze but refuses albuterol-thinks she is allergic to it -BiPAP started in ED but patient was not able to tolerate it due to sore throat -Continue high flow nasal cannula -VBG shows pH 7.7, PCO2 26, PO2 74 -Continue Atrovent as needed for wheezing and shortness of breath -Continue Symbicort -Strep negative -Chest x-ray shows no active disease -Solu-Medrol started in the ED, continue Solu-Medrol -Continue to monitor

## 2022-02-19 NOTE — Progress Notes (Signed)
1530 bladder scanned at >500 patient states she feels like her bladder is full and she cant urinate straight cath at 550 ml pt tolerated well

## 2022-02-20 ENCOUNTER — Encounter: Payer: Self-pay | Admitting: Family Medicine

## 2022-02-20 DIAGNOSIS — J441 Chronic obstructive pulmonary disease with (acute) exacerbation: Secondary | ICD-10-CM | POA: Diagnosis not present

## 2022-02-20 DIAGNOSIS — N179 Acute kidney failure, unspecified: Secondary | ICD-10-CM | POA: Diagnosis not present

## 2022-02-20 DIAGNOSIS — J9601 Acute respiratory failure with hypoxia: Secondary | ICD-10-CM | POA: Diagnosis not present

## 2022-02-20 DIAGNOSIS — E876 Hypokalemia: Secondary | ICD-10-CM | POA: Diagnosis not present

## 2022-02-20 LAB — COMPREHENSIVE METABOLIC PANEL
ALT: 18 U/L (ref 0–44)
AST: 38 U/L (ref 15–41)
Albumin: 2.4 g/dL — ABNORMAL LOW (ref 3.5–5.0)
Alkaline Phosphatase: 81 U/L (ref 38–126)
Anion gap: 9 (ref 5–15)
BUN: 25 mg/dL — ABNORMAL HIGH (ref 8–23)
CO2: 32 mmol/L (ref 22–32)
Calcium: 7.1 mg/dL — ABNORMAL LOW (ref 8.9–10.3)
Chloride: 90 mmol/L — ABNORMAL LOW (ref 98–111)
Creatinine, Ser: 1.63 mg/dL — ABNORMAL HIGH (ref 0.44–1.00)
GFR, Estimated: 32 mL/min — ABNORMAL LOW (ref >60)
Glucose, Bld: 168 mg/dL — ABNORMAL HIGH (ref 70–99)
Potassium: 3.1 mmol/L — ABNORMAL LOW (ref 3.5–5.1)
Sodium: 131 mmol/L — ABNORMAL LOW (ref 135–145)
Total Bilirubin: 1.2 mg/dL (ref 0.3–1.2)
Total Protein: 6.1 g/dL — ABNORMAL LOW (ref 6.5–8.1)

## 2022-02-20 LAB — PHOSPHORUS: Phosphorus: 2.8 mg/dL (ref 2.5–4.6)

## 2022-02-20 LAB — MAGNESIUM: Magnesium: 2 mg/dL (ref 1.7–2.4)

## 2022-02-20 LAB — POTASSIUM: Potassium: 3.6 mmol/L (ref 3.5–5.1)

## 2022-02-20 MED ORDER — REVEFENACIN 175 MCG/3ML IN SOLN
175.0000 ug | Freq: Every day | RESPIRATORY_TRACT | Status: DC
  Administered 2022-02-21: 175 ug via RESPIRATORY_TRACT
  Filled 2022-02-20 (×2): qty 3

## 2022-02-20 MED ORDER — KCL-LACTATED RINGERS 20 MEQ/L IV SOLN
INTRAVENOUS | Status: DC
  Filled 2022-02-20: qty 1000

## 2022-02-20 MED ORDER — MELATONIN 5 MG PO TABS
5.0000 mg | ORAL_TABLET | Freq: Once | ORAL | Status: AC
  Administered 2022-02-20: 5 mg via ORAL
  Filled 2022-02-20: qty 1

## 2022-02-20 MED ORDER — ARFORMOTEROL TARTRATE 15 MCG/2ML IN NEBU
15.0000 ug | INHALATION_SOLUTION | Freq: Two times a day (BID) | RESPIRATORY_TRACT | Status: DC
  Administered 2022-02-21: 15 ug via RESPIRATORY_TRACT
  Filled 2022-02-20 (×3): qty 2

## 2022-02-20 MED ORDER — POTASSIUM CHLORIDE 2 MEQ/ML IV SOLN
INTRAVENOUS | Status: AC
  Filled 2022-02-20: qty 1000

## 2022-02-20 MED ORDER — POTASSIUM CHLORIDE 10 MEQ/100ML IV SOLN
10.0000 meq | INTRAVENOUS | Status: AC
  Administered 2022-02-20 (×2): 10 meq via INTRAVENOUS
  Filled 2022-02-20 (×2): qty 100

## 2022-02-20 MED ORDER — KCL-LACTATED RINGERS 20 MEQ/L IV SOLN: INTRAVENOUS | Status: DC

## 2022-02-20 NOTE — Plan of Care (Signed)

## 2022-02-20 NOTE — Evaluation (Signed)
Physical Therapy Evaluation Patient Details Name: Kendra Clarke MRN: [id number] DOB: 1941-02-21 Today's Date: 02/20/2022  History of Present Illness  Patient is an 81 y/o F with medical history including asthma, COPD, DM, HTN, liver disease, ventral hernia, who is admitted with acute respiratory failure in setting of COPD and AKI. Also noted for recent hospital admission.   Clinical Impression  Pt alert, agreeable to PT, oriented x4. Pt reported at baseline she is ambulatory with her [facility], lives with her granddaughter who is available 24/7, and the pt is the primary caregiver of her nephew, who also lives with her. Pt performs IADLs and ADLs modI-I.    She was able to move all extremities against gravity, and performed supine to sit modI. Good sitting balance noted. Pt did need assist donning socks, she reports difficulty with this task at baseline, PT and pt discussed shoe horn and sock aid. Sit <> stand several times, with RW CGA-supervision, cued for safe hand placement. She was able to use the [facility] with CGA and performed her own pericare. She ambulated ~58ft with RW and CGA-supervision, no LOB noted and HR and spO2 WFLs. Overall the patient demonstrates near return to baseline level of functioning, but due to increased need for RW (normally SPC at baseline), and recent hospitalizations, HHPT recommended to maximize function and safety.     Recommendations for follow up therapy are one component of a multi-disciplinary discharge planning process, led by the attending physician.  Recommendations may be updated based on patient status, additional functional criteria and insurance authorization.  Follow Up Recommendations Home health PT    Assistance Recommended at Discharge Intermittent Supervision/Assistance  Patient can return home with the following  A little help with bathing/dressing/bathroom;Assistance with cooking/housework;Help with stairs or ramp for entrance    Equipment  Recommendations None recommended by PT  Recommendations for Other Services       Functional Status Assessment Patient has had a recent decline in their functional status and demonstrates the ability to make significant improvements in function in a reasonable and predictable amount of time.     Precautions / Restrictions Precautions Precautions: Fall Restrictions Weight Bearing Restrictions: No      Mobility  Bed Mobility Overal bed mobility: Modified Independent                  Transfers Overall transfer level: Needs assistance Equipment used: 1 person hand held assist, Rolling walker (2 wheels) Transfers: Sit to/from Stand, Bed to chair/wheelchair/BSC Sit to Stand: Min guard, Supervision Stand pivot transfers: Min assist         General transfer comment: from EOB to [facility], minA for steadying. with RW and from recliner, CGA-supervision, cues for hand placement    Ambulation/Gait Ambulation/Gait assistance: Supervision, Min guard Gait Distance (Feet): 55 Feet Assistive device: Rolling walker (2 wheels)         General Gait Details: progressed to supervision with ambulation, no LOB noted HR and spO2 WFLs  Stairs            Wheelchair Mobility    Modified Rankin (Stroke Patients Only)       Balance Overall balance assessment: Needs assistance Sitting-balance support: Feet supported Sitting balance-Leahy Scale: Good       Standing balance-Leahy Scale: Fair Standing balance comment: able to stand and perform pericare CGA  Pertinent Vitals/Pain Pain Assessment Pain Assessment: Faces Faces Pain Scale: Hurts a little bit Pain Location: R arm due to IV Pain Descriptors / Indicators: Burning Pain Intervention(s): Monitored during session, Repositioned    Home Living Family/patient expects to be discharged to:: Private residence Living Arrangements: Other relatives (granddaughter, nephew) Available Help  at Discharge: Family;Available 24 hours/day Type of Home: Mobile home Home Access: Stairs to enter Entrance Stairs-Rails: Right;Left;Can reach both Entrance Stairs-Number of Steps: 5   Home Layout: One level Home Equipment: Rolling Walker (2 wheels);BSC/3in1;Cane - single point;Shower seat;Grab bars - tub/shower      Prior Function Prior Level of Function : Independent/Modified Independent             Mobility Comments: pt reports using her SPC most recently ADLs Comments: Caregiver for nephew (9 y.o.); very active     Hand Dominance   Dominant Hand: Right    Extremity/Trunk Assessment   Upper Extremity Assessment Upper Extremity Assessment: Overall WFL for tasks assessed    Lower Extremity Assessment Lower Extremity Assessment:  (able to lift and move against gravity)       Communication   Communication: No difficulties  Cognition Arousal/Alertness: Awake/alert Behavior During Therapy: WFL for tasks assessed/performed Overall Cognitive Status: Within Functional Limits for tasks assessed                                          General Comments      Exercises Other Exercises Other Exercises: PT and pt discussed PT role, AD at home (reacher, sock aid, shoe horn), discharge recommendations and RW use for the next week or two at discharge to ensure safety   Assessment/Plan    PT Assessment Patient needs continued PT services  PT Problem List Decreased mobility;Decreased activity tolerance;Decreased balance;Decreased knowledge of use of DME       PT Treatment Interventions DME instruction;Gait training;Therapeutic exercise;Balance training;Stair training;Neuromuscular re-education;Functional mobility training;Therapeutic activities;Patient/family education    PT Goals (Current goals can be found in the Care Plan section)  Acute Rehab PT Goals Patient Stated Goal: to go home tomorrow PT Goal Formulation: With patient Time For Goal  Achievement: 03/06/22 Potential to Achieve Goals: Good    Frequency Min 2X/week     Co-evaluation               AM-PAC PT "6 Clicks" Mobility  Outcome Measure Help needed turning from your back to your side while in a flat bed without using bedrails?: None Help needed moving from lying on your back to sitting on the side of a flat bed without using bedrails?: None Help needed moving to and from a bed to a chair (including a wheelchair)?: None Help needed standing up from a chair using your arms (e.g., wheelchair or bedside chair)?: A Little Help needed to walk in hospital room?: A Little Help needed climbing 3-5 steps with a railing? : A Little 6 Click Score: 21    End of Session Equipment Utilized During Treatment: Gait belt Activity Tolerance: Patient tolerated treatment well Patient left: in chair;with call bell/phone within reach Nurse Communication: Mobility status PT Visit Diagnosis: Other abnormalities of gait and mobility (R26.89);Difficulty in walking, not elsewhere classified (R26.2);Muscle weakness (generalized) (M62.81)    Time: 1610-9604 PT Time Calculation (min) (ACUTE ONLY): 22 min   Charges:   PT Evaluation $PT Eval Low Complexity: 1 Low PT Treatments $Therapeutic Activity:  8-22 mins       Olga Coasteriana Julieanne Hadsall PT, DPT 9:54 AM,02/20/22

## 2022-02-20 NOTE — Consult Note (Addendum)
PHARMACY CONSULT NOTE  Pharmacy Consult for Electrolyte Monitoring and Replacement   Recent Labs: Potassium (mmol/L)  Date Value  02/20/2022 3.1 (L)  10/11/2013 3.6   Magnesium (mg/dL)  Date Value  02/20/2022 2.0  10/11/2013 1.5 (L)   Calcium (mg/dL)  Date Value  02/20/2022 7.1 (L)   Calcium, Total (mg/dL)  Date Value  10/11/2013 9.4   Albumin (g/dL)  Date Value  02/20/2022 2.4 (L)  10/09/2013 3.1 (L)   Phosphorus (mg/dL)  Date Value  11/21/2021 2.5   Sodium (mmol/L)  Date Value  02/20/2022 131 (L)  10/11/2013 132 (L)   Assessment: Patient is an 81 y/o F with medical history including asthma, COPD, DM, HTN, liver disease who is admitted with acute respiratory failure in setting of COPDE and AKI. Pharmacy consulted to assist with electrolyte monitoring and replacement as indicated.  Labs on admission notable for severe electrolyte disturbances with K+ < 2  Persistent AKI (Scr 1.8 >> 1.63, BL ~ 1)  Goal of Therapy:  Electrolytes within normal limits  Plan:  --Na 131, stable, un-clear etiology --K 3.1, IV Kcl 10 mEq x 2 runs per overnight coverage --Corrected calcium 8.4 mg/dL, continue to monitor --Re-check K+ at noon today and all other electrolytes again with AM labs tomorrow  Update: Potassium re-check = 3.6  Benita Gutter 02/20/2022 7:37 AM

## 2022-02-20 NOTE — Progress Notes (Signed)
PT Cancellation Note  Patient Details Name: Kendra Clarke MRN: [id number] DOB: August 02, 1941   Cancelled Treatment:    Reason Eval/Treat Not Completed: Other (comment). Pt eating breakfast, PT to re-attempt as able.    Olga Coaster PT, DPT 8:53 AM,02/20/22

## 2022-02-20 NOTE — Progress Notes (Addendum)
Progress Note    Kendra Clarke  [id number] DOB: 1941/04/29  DOA: 02/19/2022 PCP: Ranae Plumber, PA      Brief Narrative:    Medical records reviewed and are as summarized below:  Kendra Clarke is a 81 y.o. female with medical history significant for asthma, COPD, chronic diastolic CHF, type II DM, hypertension, liver disease, arthritis, umbilical hernia, recent hospitalization at [facility] from 5/12 to 02/01/2022 for hypokalemia, CHF exacerbation and diarrhea (from EMR chart review).  She presented to the hospital with worsening shortness of breath and hoarse voice.  She also reported exertional chest pain and wheezing.       Assessment/Plan:   Principal Problem:   Acute respiratory failure with hypoxia (HCC) Active Problems:   HTN (hypertension)   HLD (hyperlipidemia)   COPD exacerbation (HCC)   Hypokalemia   Protein-calorie malnutrition, moderate (HCC)   AKI (acute kidney injury) ([place])     Body mass index is 26.57 kg/m.   Acute hypoxic respiratory failure: Resolved.  She is off BiPAP and oxygen via nasal cannula.  She is tolerating room air.  AKI: Creatinine was trending down but it's gone up again.  Restart IV fluids for hydration and monitor BMP.  Hypokalemia: Improving.  Continue potassium repletion and monitor.  Hypomagnesemia: Improved  COPD exacerbation: Change IV Solu-Medrol to oral prednisone.  Continue bronchodilators.  Chronic diastolic CHF, mild aortic valve stenosis: Compensated.  Torsemide on hold.  From EMR chart review, 2D echo on 01/29/2022 showed preserved EF, grade 2 diastolic dysfunction, mild aortic stenosis  Stage I bilateral buttock decubitus ulcer (present on admission): Continue local wound care   Diet Order             Diet Heart Room service appropriate? Yes; Fluid consistency: Thin  Diet effective now                            Consultants: None  Procedures: None    Medications:     arformoterol  15 mcg Nebulization BID   budesonide (PULMICORT) nebulizer solution  2 mg Nebulization Q12H   Chlorhexidine Gluconate Cloth  6 each Topical Daily   heparin  5,000 Units Subcutaneous Q8H   methylPREDNISolone (SOLU-MEDROL) injection  80 mg Intravenous Q24H   revefenacin  175 mcg Nebulization Daily   Continuous Infusions:  lactated ringers with KCl 20 mEq/L       Anti-infectives (From admission, onward)    None              Family Communication/Anticipated D/C date and plan/Code Status   DVT prophylaxis: heparin injection 5,000 Units Start: 02/19/22 0630 SCDs Start: 02/19/22 [id number]     Code Status: DNR  Family Communication: None Disposition Plan: Plan to discharge home tomorrow   Status is: Inpatient Remains inpatient appropriate because: COPD exacerbation, hypokalemia, requires IV fluids       Subjective:   Interval events noted.  No vomiting, diarrhea, shortness of breath or chest pain.  She feels a little better today.  Her nurse was at the bedside.  Objective:    Vitals:   02/20/22 0926 02/20/22 1200 02/20/22 1310 02/20/22 1600  BP: 125/82 127/61  114/60  Pulse: 99 95  86  Resp: (!) 22 (!) 23  20  Temp:   98.6 F (37 C)   TempSrc:   Oral   SpO2:      Weight:      Height:  No data found.   Intake/Output Summary (Last 24 hours) at 02/20/2022 1724 Last data filed at 02/20/2022 1300 Gross per 24 hour  Intake 698.31 ml  Output 425 ml  Net 273.31 ml   Filed Weights   02/19/22 1004  Weight: 70.2 kg    Exam:  GEN: NAD, sitting up in the chair SKIN: Warm and dry EYES: No pallor or icterus ENT: MMM CV: RRR PULM: CTA B ABD: soft, large abdominal/umbilical hernia, NT, +BS CNS: AAO x 3, non focal EXT: No edema or tenderness     Data Reviewed:   I have personally reviewed following labs and imaging studies:  Labs: Labs show the following:   Basic Metabolic Panel: Recent Labs  Lab 02/19/22 0340 02/19/22 0749  02/19/22 1029 02/19/22 1933 02/20/22 0336 02/20/22 1255  NA 132*  --  131*  --  131*  --   K <2.0*  --  2.3* 3.6 3.1* 3.6  CL 84*  --  86*  --  90*  --   CO2 30  --  33*  --  32  --   GLUCOSE 127*  --  184*  --  168*  --   BUN 19  --  19  --  25*  --   CREATININE 1.80*  --  1.46*  --  1.63*  --   CALCIUM 6.9*  --  6.9*  --  7.1*  --   MG  --  1.3*  --  1.9 2.0  --   PHOS  --   --   --   --  2.8  --    GFR Estimated Creatinine Clearance: 26.5 mL/min (A) (by C-G formula based on SCr of 1.63 mg/dL (H)). Liver Function Tests: Recent Labs  Lab 02/19/22 0340 02/19/22 1029 02/20/22 0336  AST 49* 46* 38  ALT 19 19 18   ALKPHOS 100 87 81  BILITOT 1.4* 1.7* 1.2  PROT 7.0 6.3* 6.1*  ALBUMIN 2.8* 2.5* 2.4*   No results for input(s): LIPASE, AMYLASE in the last 168 hours. No results for input(s): AMMONIA in the last 168 hours. Coagulation profile No results for input(s): INR, PROTIME in the last 168 hours.  CBC: Recent Labs  Lab 02/19/22 0340  WBC 9.2  NEUTROABS 5.9  HGB 12.4  HCT 36.2  MCV 86.0  PLT 241   Cardiac Enzymes: No results for input(s): CKTOTAL, CKMB, CKMBINDEX, TROPONINI in the last 168 hours. BNP (last 3 results) No results for input(s): PROBNP in the last 8760 hours. CBG: Recent Labs  Lab 02/19/22 1007  GLUCAP 195*   D-Dimer: No results for input(s): DDIMER in the last 72 hours. Hgb A1c: No results for input(s): HGBA1C in the last 72 hours. Lipid Profile: No results for input(s): CHOL, HDL, LDLCALC, TRIG, CHOLHDL, LDLDIRECT in the last 72 hours. Thyroid function studies: No results for input(s): TSH, T4TOTAL, T3FREE, THYROIDAB in the last 72 hours.  Invalid input(s): FREET3 Anemia work up: No results for input(s): VITAMINB12, FOLATE, FERRITIN, TIBC, IRON, RETICCTPCT in the last 72 hours. Sepsis Labs: Recent Labs  Lab 02/19/22 0340  WBC 9.2    Microbiology Recent Results (from the past 240 hour(s))  Group A Strep by PCR ([place] Only)      Status: None   Collection Time: 02/19/22  3:59 AM   Specimen: Throat; Sterile Swab  Result Value Ref Range Status   Group A Strep by PCR NOT DETECTED NOT DETECTED Final    Comment: Performed at [facility]  [facility], [place], [place] [address]  SARS Coronavirus 2 by RT PCR (hospital order, performed in [facility] hospital lab) *cepheid single result test* Anterior Nasal Swab     Status: None   Collection Time: 02/19/22  3:59 AM   Specimen: Anterior Nasal Swab  Result Value Ref Range Status   SARS Coronavirus 2 by RT PCR NEGATIVE NEGATIVE Final    Comment: (NOTE) SARS-CoV-2 target nucleic acids are NOT DETECTED.  The SARS-CoV-2 RNA is generally detectable in upper and lower respiratory specimens during the acute phase of infection. The lowest concentration of SARS-CoV-2 viral copies this assay can detect is 250 copies / mL. A negative result does not preclude SARS-CoV-2 infection and should not be used as the sole basis for treatment or other patient management decisions.  A negative result may occur with improper specimen collection / handling, submission of specimen other than nasopharyngeal swab, presence of viral mutation(s) within the areas targeted by this assay, and inadequate number of viral copies (<250 copies / mL). A negative result must be combined with clinical observations, patient history, and epidemiological information.  Fact Sheet for Patients:   https://www.patel.info/  Fact Sheet for Healthcare Providers: https://hall.com/  This test is not yet approved or  cleared by the [place] FDA and has been authorized for detection and/or diagnosis of SARS-CoV-2 by FDA under an Emergency Use Authorization (EUA).  This EUA will remain in effect (meaning this test can be used) for the duration of the COVID-19 declaration under Section 564(b)(1) of the Act, 21 U.S.C. section 360bbb-3(b)(1), unless the  authorization is terminated or revoked sooner.  Performed at [facility], [address] [place], [place] [address]     Procedures and diagnostic studies:  DG [facility] 1 View  Result Date: 02/19/2022 CLINICAL DATA:  Shortness of breath EXAM: PORTABLE CHEST 1 VIEW COMPARISON:  11/18/2021 FINDINGS: Normal cardiac size. No focal opacity or pleural effusion. Aortic atherosclerosis. No pneumothorax. IMPRESSION: No active disease. Electronically Signed   By: Donavan Foil M.D.   On: 02/19/2022 03:51               LOS: 1 day   Cleo Santucci  [facility]   Pager on [email or web address]. If 7PM-7AM, please contact night-coverage at [email or web address]     02/20/2022, 5:24 PM

## 2022-02-21 DIAGNOSIS — J441 Chronic obstructive pulmonary disease with (acute) exacerbation: Secondary | ICD-10-CM | POA: Diagnosis not present

## 2022-02-21 DIAGNOSIS — N179 Acute kidney failure, unspecified: Secondary | ICD-10-CM | POA: Diagnosis not present

## 2022-02-21 DIAGNOSIS — E876 Hypokalemia: Secondary | ICD-10-CM | POA: Diagnosis not present

## 2022-02-21 DIAGNOSIS — J9601 Acute respiratory failure with hypoxia: Secondary | ICD-10-CM | POA: Diagnosis not present

## 2022-02-21 LAB — BASIC METABOLIC PANEL
Anion gap: 7 (ref 5–15)
BUN: 26 mg/dL — ABNORMAL HIGH (ref 8–23)
CO2: 31 mmol/L (ref 22–32)
Calcium: 8.1 mg/dL — ABNORMAL LOW (ref 8.9–10.3)
Chloride: 92 mmol/L — ABNORMAL LOW (ref 98–111)
Creatinine, Ser: 1.33 mg/dL — ABNORMAL HIGH (ref 0.44–1.00)
GFR, Estimated: 40 mL/min — ABNORMAL LOW (ref >60)
Glucose, Bld: 192 mg/dL — ABNORMAL HIGH (ref 70–99)
Potassium: 3.5 mmol/L (ref 3.5–5.1)
Sodium: 130 mmol/L — ABNORMAL LOW (ref 135–145)

## 2022-02-21 LAB — MAGNESIUM: Magnesium: 2 mg/dL (ref 1.7–2.4)

## 2022-02-21 LAB — TSH: TSH: 1.686 u[IU]/mL (ref 0.350–4.500)

## 2022-02-21 MED ORDER — AMLODIPINE BESYLATE 10 MG PO TABS
10.0000 mg | ORAL_TABLET | Freq: Every day | ORAL | Status: DC
  Administered 2022-02-21: 10 mg via ORAL
  Filled 2022-02-21: qty 1

## 2022-02-21 MED ORDER — METHIMAZOLE 5 MG PO TABS
5.0000 mg | ORAL_TABLET | Freq: Every day | ORAL | Status: DC
  Administered 2022-02-21: 5 mg via ORAL
  Filled 2022-02-21: qty 1

## 2022-02-21 MED ORDER — MOMETASONE FURO-FORMOTEROL FUM 200-5 MCG/ACT IN AERO
2.0000 | INHALATION_SPRAY | Freq: Two times a day (BID) | RESPIRATORY_TRACT | Status: DC
  Filled 2022-02-21: qty 8.8

## 2022-02-21 MED ORDER — LISINOPRIL 20 MG PO TABS
20.0000 mg | ORAL_TABLET | Freq: Every day | ORAL | Status: DC
  Administered 2022-02-21: 20 mg via ORAL
  Filled 2022-02-21: qty 1

## 2022-02-21 MED ORDER — ROSUVASTATIN CALCIUM 10 MG PO TABS: 20.0000 mg | ORAL_TABLET | Freq: Every day | ORAL | Status: DC

## 2022-02-21 MED ORDER — IPRATROPIUM BROMIDE 0.02 % IN SOLN: 2.5000 mL | RESPIRATORY_TRACT | Status: DC | PRN

## 2022-02-21 MED ORDER — PREDNISONE 20 MG PO TABS
40.0000 mg | ORAL_TABLET | Freq: Every day | ORAL | Status: DC
  Administered 2022-02-21: 40 mg via ORAL
  Filled 2022-02-21: qty 2

## 2022-02-21 MED ORDER — PREDNISONE 20 MG PO TABS: 20.0000 mg | ORAL_TABLET | Freq: Every day | ORAL | Status: DC

## 2022-02-21 MED ORDER — PREDNISONE 20 MG PO TABS: 40.0000 mg | ORAL_TABLET | Freq: Every day | ORAL | 0 refills | Status: AC

## 2022-02-21 NOTE — TOC Transition Note (Signed)
Transition of Care [facility]) - CM/SW Discharge Note   Patient Details  Name: DARNITA WOODRUM MRN: [id number] Date of Birth: 05-02-1941  Transition of Care [facility]) CM/SW Contact:  Margarito Liner, LCSW Phone Number: 02/21/2022, 12:20 PM   Clinical Narrative:   Patient has orders to discharge home today. Was unable to reach anyone at [facility] so patient's next preference was [facility]. Referral accepted for PT, RN. No further concerns. CSW signing off.  Final next level of care: Home w Home Health Services Barriers to Discharge: Barriers Resolved   Patient Goals and CMS Choice   CMS Medicare.gov Compare Post Acute Care list provided to:: Patient Choice offered to / list presented to : Patient  Discharge Placement                Patient to be transferred to facility by: Son Name of family member notified: Milly Jakob Patient and family notified of of transfer: 02/21/22  Discharge Plan and Services     Post Acute Care Choice: Home Health                    HH Arranged: RN, PT [facility] Agency: [facility] Date [facility] Agency Contacted: 02/21/22   Representative spoke with at [facility] Agency: Lorenza Chick  Social Determinants of Health (SDOH) Interventions     Readmission Risk Interventions     View : No data to display.

## 2022-02-21 NOTE — TOC Initial Note (Signed)
Transition of Care [facility]) - Initial/Assessment Note    Patient Details  Name: Kendra Clarke MRN: [id number] Date of Birth: 1941-09-10  Transition of Care [facility]) CM/SW Contact:    Candie Chroman, LCSW Phone Number: 02/21/2022, 10:05 AM  Clinical Narrative:  CSW met with patient. No supports at bedside. CSW introduced role and explained that home health recommendations would be discussed. She is agreeable to home health. [facility] is first preference. Left voicemail for representative. No DME recommendations. No further concerns. CSW encouraged patient to contact CSW as needed. CSW will continue to follow patient for support and facilitate return home when stable.               Expected Discharge Plan: [place] Barriers to Discharge: Continued Medical Work up   Patient Goals and CMS Choice   CMS Medicare.gov Compare Post Acute Care list provided to:: Patient    Expected Discharge Plan and Services Expected Discharge Plan: [place] Choice: [place] arrangements for the past 2 months: Single Family Home                                      Prior Living Arrangements/Services Living arrangements for the past 2 months: Single Family Home Lives with:: Relatives Patient language and need for interpreter reviewed:: Yes Do you feel safe going back to the place where you live?: Yes      Need for Family Participation in Patient Care: Yes (Comment) Care giver support system in place?: Yes (comment)   Criminal Activity/Legal Involvement Pertinent to Current Situation/Hospitalization: No - Comment as needed  Activities of Daily Living Home Assistive Devices/Equipment: Walker (specify type), Cane (specify quad or straight), Eyeglasses, Dentures (specify type) ADL Screening (condition at time of admission) Patient's cognitive ability adequate to safely complete daily activities?: Yes Is the patient deaf or have  difficulty hearing?: No Does the patient have difficulty seeing, even when wearing glasses/contacts?: No Does the patient have difficulty concentrating, remembering, or making decisions?: No Patient able to express need for assistance with ADLs?: Yes Does the patient have difficulty dressing or bathing?: No Independently performs ADLs?: Yes (appropriate for developmental age) Does the patient have difficulty walking or climbing stairs?: No Weakness of Legs: None Weakness of Arms/Hands: None  Permission Sought/Granted Permission sought to share information with : Facility Art therapist granted to share information with : Yes, Verbal Permission Granted     Permission granted to share info w AGENCY: Home Health Agencies        Emotional Assessment Appearance:: Appears stated age Attitude/Demeanor/Rapport: Engaged, Gracious Affect (typically observed): Accepting, Appropriate, Calm, Pleasant Orientation: : Oriented to Self, Oriented to Place, Oriented to  Time, Oriented to Situation Alcohol / Substance Use: Not Applicable Psych Involvement: No (comment)  Admission diagnosis:  Hypokalemia [E87.6] Stomatitis [K12.1] COPD exacerbation (HCC) [J44.1] Acute respiratory failure with hypoxia (HCC) [J96.01] Patient Active Problem List   Diagnosis Date Noted   Acute respiratory failure with hypoxia ([place]) 02/19/2022   Protein-calorie malnutrition, moderate ([place]) 02/19/2022   AKI (acute kidney injury) ([place]) 02/19/2022   Hypoxia    Edema    Abdominal pain    Bronchitis 11/12/2021   HTN (hypertension) 11/12/2021   HLD (hyperlipidemia) 11/12/2021   COPD exacerbation ([place]) 11/12/2021   Diabetes mellitus without complication ([place]) 99/24/2683   Fall at  home, initial encounter 11/12/2021   Hyponatremia 11/12/2021   Hypokalemia 11/12/2021   Disorder of electrolytes 11/12/2021   Elevated troponin 11/12/2021   Nausea & vomiting 11/12/2021   Hypomagnesemia 11/12/2021   Acute  respiratory disease due to COVID-19 virus 11/12/2021   Varicose veins of leg with pain, 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   Superficial thrombophlebitis 01/13/2021   Essential hypertension, benign 01/13/2021   Diabetes ([place]) 01/13/2021   Grief at loss of child 12/02/2017   Sepsis ([place]) 12/01/2017   PCP:  Ranae Plumber, [place] [facility]:   [place], [place] - [place] [address] [place] [address] Phone: [phone] Fax: [place] [address]), [place] - [place]) [place] [address] Phone: [phone] Fax: [phone]     Social Determinants of Health (SDOH) Interventions    Readmission Risk Interventions     View : No data to display.

## 2022-02-21 NOTE — Plan of Care (Signed)
PIV removed tip intact  Discharge instructions reviewed with patient and given who verbalized understnding

## 2022-02-21 NOTE — Discharge Summary (Addendum)
Physician Discharge Summary   Patient: Kendra Clarke MRN: [id number] DOB: [birth year]  Admit date:     02/19/2022  Discharge date: 02/21/22  Discharge Physician: Lurene ShadowBERNARD [facility] Zynda   PCP: Shane CrutchMarkley, Linda, PA   Recommendations at discharge:   Follow-up with PCP in 1 week  Discharge Diagnoses: Principal Problem:   Acute respiratory failure with hypoxia (HCC) Active Problems:   HTN (hypertension)   HLD (hyperlipidemia)   COPD exacerbation (HCC)   Hypokalemia   Hypomagnesemia   Protein-calorie malnutrition, moderate (HCC)   AKI (acute kidney injury) (HCC)  Resolved Problems:   * No resolved hospital problems. *  Hospital Course: Kendra Clarke is a 81 y.o. female with medical history significant for asthma, COPD, chronic diastolic CHF, type II DM, hypertension, liver disease, arthritis, umbilical hernia, recent hospitalization at [facility] from 5/12 to 02/01/2022 for hypokalemia, CHF exacerbation and diarrhea (from EMR chart review).  She presented to the hospital with worsening shortness of breath and hoarse voice.  She also reported exertional chest pain and wheezing.   She was admitted to the hospital for COPD exacerbation, acute hypoxic respiratory failure, AKI, hypokalemia and hypomagnesemia.  She was treated with steroids, bronchodilators and IV fluids.  Potassium and magnesium were repleted.  She initially required BiPAP and she was transitioned to oxygen via nasal cannula.  She has been successfully weaned off oxygen.  Her condition has improved and she is deemed stable for discharge to home today.         Consultants: None Procedures performed: None Disposition: Home health Diet recommendation:  Discharge Diet Orders (From admission, onward)     Start     Ordered   02/21/22 0000  Diet - low sodium heart healthy        02/21/22 1016           Cardiac diet DISCHARGE MEDICATION: Allergies as of 02/21/2022       Reactions   Albuterol Anaphylaxis, Rash   Other  reaction(s): Other (See Comments) Patient states it gives her an asthma attack Other reaction(s): Other (See Comments), Respiratory Distress Patient states it gives her an asthma attack Other reaction(s): Other (See Comments) Patient states it gives her an asthma attack   Codeine Anxiety, Shortness Of Breath   Tramadol Hives   Sulfa Antibiotics Rash        Medication List     STOP taking these medications    furosemide 20 MG tablet Commonly known as: LASIX   rosuvastatin 20 MG tablet Commonly known as: CRESTOR       TAKE these medications    amLODipine 10 MG tablet Commonly known as: NORVASC Take 10 mg by mouth daily.   atenolol 25 MG tablet Commonly known as: TENORMIN Take 1 tablet (25 mg total) by mouth daily.   Atrovent HFA 17 MCG/ACT inhaler Generic drug: ipratropium Inhale into the lungs.   lisinopril 20 MG tablet Commonly known as: ZESTRIL Take by mouth.   methimazole 5 MG tablet Commonly known as: TAPAZOLE Take 1 tablet (5 mg total) by mouth daily.   potassium chloride 10 MEQ tablet Commonly known as: KLOR-CON Take 30 mEq by mouth daily.   predniSONE 20 MG tablet Commonly known as: DELTASONE Take 2 tablets (40 mg total) by mouth daily with breakfast for 2 days. Start taking on: February 22, 2022   Symbicort 160-4.5 MCG/ACT inhaler Generic drug: budesonide-formoterol INHALE 2 PUFFS TWICE DAILY FOR ASTHMA   torsemide 20 MG tablet Commonly known as: DEMADEX Take 40  mg by mouth daily.   Trulicity 0.75 MG/0.5ML Sopn Generic drug: Dulaglutide Inject 0.75 mg into the skin once a week.               Discharge Care Instructions  (From admission, onward)           Start     Ordered   02/21/22 0000  Discharge wound care:       Comments: Avoid sitting or laying on your buttocks for prolonged periods   02/21/22 1016            Follow-up Information     [facility] Follow up.   Specialty: Home Health Services Why: They  will follow up with you for your home health needs: Physical therapy and nursing. Contact information: [address] [place] [address] [phone]                Discharge Exam: Ceasar Mons Weights   02/19/22 1004  Weight: 70.2 kg   GEN: NAD SKIN: Warm and dry EYES: No pallor or icterus ENT: MMM CV: RRR PULM: CTA B ABD: soft, ND, NT, +BS CNS: AAO x 3, non focal EXT: No edema or tenderness   Condition at discharge: good  The results of significant diagnostics from this hospitalization (including imaging, microbiology, ancillary and laboratory) are listed below for reference.   Imaging Studies: DG Chest Port 1 View  Result Date: 02/19/2022 CLINICAL DATA:  Shortness of breath EXAM: PORTABLE CHEST 1 VIEW COMPARISON:  11/18/2021 FINDINGS: Normal cardiac size. No focal opacity or pleural effusion. Aortic atherosclerosis. No pneumothorax. IMPRESSION: No active disease. Electronically Signed   By: Jasmine Pang M.D.   On: 02/19/2022 03:51    Microbiology: Results for orders placed or performed during the hospital encounter of 02/19/22  Group A Strep by PCR ([facility] Only)     Status: None   Collection Time: 02/19/22  3:59 AM   Specimen: Throat; Sterile Swab  Result Value Ref Range Status   Group A Strep by PCR NOT DETECTED NOT DETECTED Final    Comment: Performed at [facility], [address], [place], [place] [address]  SARS Coronavirus 2 by RT PCR (hospital order, performed in [facility] hospital lab) *cepheid single result test* Anterior Nasal Swab     Status: None   Collection Time: 02/19/22  3:59 AM   Specimen: Anterior Nasal Swab  Result Value Ref Range Status   SARS Coronavirus 2 by RT PCR NEGATIVE NEGATIVE Final    Comment: (NOTE) SARS-CoV-2 target nucleic acids are NOT DETECTED.  The SARS-CoV-2 RNA is generally detectable in upper and lower respiratory specimens during the acute phase of infection. The lowest concentration of SARS-CoV-2  viral copies this assay can detect is 250 copies / mL. A negative result does not preclude SARS-CoV-2 infection and should not be used as the sole basis for treatment or other patient management decisions.  A negative result may occur with improper specimen collection / handling, submission of specimen other than nasopharyngeal swab, presence of viral mutation(s) within the areas targeted by this assay, and inadequate number of viral copies (<250 copies / mL). A negative result must be combined with clinical observations, patient history, and epidemiological information.  Fact Sheet for Patients:   RoadLapTop.co.za  Fact Sheet for Healthcare Providers: http://kim-miller.com/  This test is not yet approved or  cleared by the [place] FDA and has been authorized for detection and/or diagnosis of SARS-CoV-2 by FDA under an  Emergency Use Authorization (EUA).  This EUA will remain in effect (meaning this test can be used) for the duration of the COVID-19 declaration under Section 564(b)(1) of the Act, 21 U.S.C. section 360bbb-3(b)(1), unless the authorization is terminated or revoked sooner.  Performed at [facility], [address] [place], [place] [address]     Labs: CBC: Recent Labs  Lab 02/19/22 0340  WBC 9.2  NEUTROABS 5.9  HGB 12.4  HCT 36.2  MCV 86.0  PLT 241   Basic Metabolic Panel: Recent Labs  Lab 02/19/22 0340 02/19/22 0749 02/19/22 1029 02/19/22 1933 02/20/22 0336 02/20/22 1255 02/21/22 0449  NA 132*  --  131*  --  131*  --  130*  K <2.0*  --  2.3* 3.6 3.1* 3.6 3.5  CL 84*  --  86*  --  90*  --  92*  CO2 30  --  33*  --  32  --  31  GLUCOSE 127*  --  184*  --  168*  --  192*  BUN 19  --  19  --  25*  --  26*  CREATININE 1.80*  --  1.46*  --  1.63*  --  1.33*  CALCIUM 6.9*  --  6.9*  --  7.1*  --  8.1*  MG  --  1.3*  --  1.9 2.0  --  2.0  PHOS  --   --   --   --  2.8  --   --    Liver  Function Tests: Recent Labs  Lab 02/19/22 0340 02/19/22 1029 02/20/22 0336  AST 49* 46* 38  ALT 19 19 18   ALKPHOS 100 87 81  BILITOT 1.4* 1.7* 1.2  PROT 7.0 6.3* 6.1*  ALBUMIN 2.8* 2.5* 2.4*   CBG: Recent Labs  Lab 02/19/22 1007  GLUCAP 195*    Discharge time spent: greater than 30 minutes.  Signed: 04/21/22, MD [facility] 02/21/2022

## 2022-02-22 LAB — BLOOD GAS, ARTERIAL
Acid-Base Excess: 12.5 mmol/L — ABNORMAL HIGH (ref 0.0–2.0)
Bicarbonate: 32.1 mmol/L — ABNORMAL HIGH (ref 20.0–28.0)
Expiratory PAP: 5 cmH2O
FIO2: 35 %
Inspiratory PAP: 10 cmH2O
Mode: POSITIVE
Patient temperature: 37
pCO2 arterial: 26 mmHg — ABNORMAL LOW (ref 32–48)
pH, Arterial: 7.7 (ref 7.35–7.45)
pO2, Arterial: 74 mmHg — ABNORMAL LOW (ref 83–108)

## 2022-02-24 ENCOUNTER — Emergency Department: Payer: Medicare HMO

## 2022-02-24 ENCOUNTER — Emergency Department
Admission: EM | Admit: 2022-02-24 | Discharge: 2022-02-24 | Disposition: A | Discharge status: Home / Self Care | Payer: Medicare HMO | Attending: Emergency Medicine | Admitting: Emergency Medicine

## 2022-02-24 ENCOUNTER — Other Ambulatory Visit: Payer: Self-pay

## 2022-02-24 DIAGNOSIS — Z7951 Long term (current) use of inhaled steroids: Secondary | ICD-10-CM | POA: Insufficient documentation

## 2022-02-24 DIAGNOSIS — R410 Disorientation, unspecified: Secondary | ICD-10-CM | POA: Diagnosis not present

## 2022-02-24 DIAGNOSIS — R600 Localized edema: Secondary | ICD-10-CM | POA: Insufficient documentation

## 2022-02-24 DIAGNOSIS — R29810 Facial weakness: Secondary | ICD-10-CM | POA: Insufficient documentation

## 2022-02-24 DIAGNOSIS — I110 Hypertensive heart disease with heart failure: Secondary | ICD-10-CM | POA: Insufficient documentation

## 2022-02-24 DIAGNOSIS — J449 Chronic obstructive pulmonary disease, unspecified: Secondary | ICD-10-CM | POA: Insufficient documentation

## 2022-02-24 DIAGNOSIS — E119 Type 2 diabetes mellitus without complications: Secondary | ICD-10-CM | POA: Diagnosis not present

## 2022-02-24 DIAGNOSIS — I5032 Chronic diastolic (congestive) heart failure: Secondary | ICD-10-CM | POA: Insufficient documentation

## 2022-02-24 DIAGNOSIS — M7989 Other specified soft tissue disorders: Secondary | ICD-10-CM | POA: Insufficient documentation

## 2022-02-24 DIAGNOSIS — J45909 Unspecified asthma, uncomplicated: Secondary | ICD-10-CM | POA: Insufficient documentation

## 2022-02-24 LAB — URINALYSIS, ROUTINE W REFLEX MICROSCOPIC
Bacteria, UA: NONE SEEN
Bilirubin Urine: NEGATIVE
Glucose, UA: NEGATIVE mg/dL
Hgb urine dipstick: NEGATIVE
Ketones, ur: NEGATIVE mg/dL
Nitrite: NEGATIVE
Protein, ur: NEGATIVE mg/dL
Specific Gravity, Urine: 1.01 (ref 1.005–1.030)
pH: 7 (ref 5.0–8.0)

## 2022-02-24 LAB — CBG MONITORING, ED: Glucose-Capillary: 170 mg/dL — ABNORMAL HIGH (ref 70–99)

## 2022-02-24 LAB — CBC
HCT: 37 % (ref 36.0–46.0)
Hemoglobin: 12 g/dL (ref 12.0–15.0)
MCH: 29.5 pg (ref 26.0–34.0)
MCHC: 32.4 g/dL (ref 30.0–36.0)
MCV: 90.9 fL (ref 80.0–100.0)
Platelets: 253 10*3/uL (ref 150–400)
RBC: 4.07 MIL/uL (ref 3.87–5.11)
RDW: 14.6 % (ref 11.5–15.5)
WBC: 10.8 10*3/uL — ABNORMAL HIGH (ref 4.0–10.5)
nRBC: 0 % (ref 0.0–0.2)

## 2022-02-24 LAB — PROTIME-INR
INR: 1.1 (ref 0.8–1.2)
Prothrombin Time: 13.7 seconds (ref 11.4–15.2)

## 2022-02-24 LAB — COMPREHENSIVE METABOLIC PANEL
ALT: 43 U/L (ref 0–44)
AST: 71 U/L — ABNORMAL HIGH (ref 15–41)
Albumin: 2.5 g/dL — ABNORMAL LOW (ref 3.5–5.0)
Alkaline Phosphatase: 108 U/L (ref 38–126)
Anion gap: 5 (ref 5–15)
BUN: 31 mg/dL — ABNORMAL HIGH (ref 8–23)
CO2: 32 mmol/L (ref 22–32)
Calcium: 8.8 mg/dL — ABNORMAL LOW (ref 8.9–10.3)
Chloride: 97 mmol/L — ABNORMAL LOW (ref 98–111)
Creatinine, Ser: 1.16 mg/dL — ABNORMAL HIGH (ref 0.44–1.00)
GFR, Estimated: 48 mL/min — ABNORMAL LOW (ref >60)
Glucose, Bld: 163 mg/dL — ABNORMAL HIGH (ref 70–99)
Potassium: 3.8 mmol/L (ref 3.5–5.1)
Sodium: 134 mmol/L — ABNORMAL LOW (ref 135–145)
Total Bilirubin: 1.1 mg/dL (ref 0.3–1.2)
Total Protein: 6 g/dL — ABNORMAL LOW (ref 6.5–8.1)

## 2022-02-24 LAB — AMMONIA: Ammonia: 31 umol/L (ref 9–35)

## 2022-02-24 LAB — BRAIN NATRIURETIC PEPTIDE: B Natriuretic Peptide: 403.2 pg/mL — ABNORMAL HIGH (ref 0.0–100.0)

## 2022-02-24 LAB — TROPONIN I (HIGH SENSITIVITY)
Troponin I (High Sensitivity): 16 ng/L (ref <18)
Troponin I (High Sensitivity): 14 ng/L (ref <18)

## 2022-02-24 MED ORDER — IOHEXOL 350 MG/ML SOLN
75.0000 mL | Freq: Once | INTRAVENOUS | Status: AC | PRN
Start: 2022-02-24 — End: 2022-02-24
  Administered 2022-02-24: 75 mL via INTRAVENOUS

## 2022-02-24 NOTE — ED Notes (Signed)
Patient transported to MRI 

## 2022-02-24 NOTE — ED Provider Notes (Signed)
-----------------------------------------   6:04 PM on 02/24/2022 ----------------------------------------- Patient care assumed from Dr. Fuller Plan.  Patient's work-up is overall reassuring.  Urinalysis is normal.  Troponin negative x2.  Ammonia is normal.  BNP is slightly elevated at 403.  Chemistry shows no significant abnormality reassuring renal function with a normal anion gap.  Normal CBC.  Patient's MRI of the brain has resulted normal showing no acute infarct.  CT scan of the chest shows a small right pleural effusion and small pulmonary nodules.  Discussed these with the patient.  Small volume ascites.  Ultrasound negative for DVT.  Given the patient's overall reassuring work-up with no sign of CVA or other acute significant abnormality I believe the patient will be safe for discharge home with PCP follow-up.  Patient agreeable to plan of care.   Minna Antis, MD 02/24/22 [phone]

## 2022-02-24 NOTE — ED Provider Notes (Signed)
[facility] Provider Note    Event Date/Time   First MD Initiated Contact with Patient 02/24/22 1339     (approximate)   History   Altered Mental Status   HPI  Kendra Clarke is a 81 y.o. female who was placed on prednisone 2 days ago and has now had more altered mental status.  Patient has been more confused for the past 48 hours.  I reviewed the records where patient was admitted and discharged on 6/7. asthma, COPD, chronic diastolic CHF, type II DM, hypertension, liver disease, arthritis, umbilical hernia, recent hospitalization at [facility] from 5/12 to 02/01/2022 for hypokalemia, CHF exacerbation and diarrhea (from EMR chart review).  According to family they were concerned that she could have had a stroke given she seemed more confused than normal.  This is in the setting of being on the steroids.  She just finished the last dose today.  She denies any worsening shortness of breath, chest pain or any other concerns.  She does have a little bit of swelling her legs which she reports is maybe a little bit worse than normal but been taking all of her diuretics.   Physical Exam   Triage Vital Signs: ED Triage Vitals  Enc Vitals Group     BP 02/24/22 1325 (!) 125/57     Pulse Rate 02/24/22 1325 65     Resp 02/24/22 1325 18     Temp 02/24/22 1325 98.9 F (37.2 C)     Temp Source 02/24/22 1325 Oral     SpO2 02/24/22 1325 98 %     Weight 02/24/22 1326 150 lb (68 kg)     Height 02/24/22 1326 5\' 4"  (1.626 m)     Head Circumference --      Peak Flow --      Pain Score 02/24/22 1326 0     Pain Loc --      Pain Edu? --      Excl. in GC? --     Most recent vital signs: Vitals:   02/24/22 1325  BP: (!) 125/57  Pulse: 65  Resp: 18  Temp: 98.9 F (37.2 C)  SpO2: 98%     General: Awake, no distress.  CV:  Good peripheral perfusion.  Resp:  Normal effort.  Abd:  No distention.   hernia noted but denies any tenderness Other:  Patient is alert and  oriented x3, equal strength in arms and legs.  Maybe a slight facial droop on the right. 2+ edema noted in her legs.  ED Results / Procedures / Treatments   Labs (all labs ordered are listed, but only abnormal results are displayed) Labs Reviewed  COMPREHENSIVE METABOLIC PANEL - Abnormal; Notable for the following components:      Result Value   Sodium 134 (*)    Chloride 97 (*)    Glucose, Bld 163 (*)    BUN 31 (*)    Creatinine, Ser 1.16 (*)    Calcium 8.8 (*)    Total Protein 6.0 (*)    Albumin 2.5 (*)    AST 71 (*)    GFR, Estimated 48 (*)    All other components within normal limits  CBC - Abnormal; Notable for the following components:   WBC 10.8 (*)    All other components within normal limits  CBG MONITORING, ED - Abnormal; Notable for the following components:   Glucose-Capillary 170 (*)    All other components within normal limits  PROTIME-INR  URINALYSIS, ROUTINE W REFLEX MICROSCOPIC     EKG  My interpretation of EKG:  Normal sinus rate of 67 without any ST elevation, T wave version lead III, normal intervals.  RADIOLOGY I have reviewed the xray personally and interpreted and there is possible pneumonia versus atelectasis in the bases  PROCEDURES:  Critical Care performed: No  .1-3 Lead EKG Interpretation  Performed by: Concha Se, MD Authorized by: Concha Se, MD     Interpretation: normal     ECG rate:  60   ECG rate assessment: normal     Rhythm: sinus rhythm     Ectopy: none     Conduction: normal      MEDICATIONS ORDERED IN ED: Medications - No data to display   IMPRESSION / MDM / ASSESSMENT AND PLAN / ED COURSE  I reviewed the triage vital signs and the nursing notes.   Patient's presentation is most consistent with acute presentation with potential threat to life or bodily function.   Differential includes stroke, last known normal was over 48 hours ago.  No evidence of LVO.  Patient may be has a slight facial droop on the  right.  We will get work-up for confusion and current labs, urine, CT head.  Suspect patient will need MRI so we will order to evaluate for stroke.  She does have some leg swelling on examination so we will get ultrasound to make sure no DVT as well as add on BNP and chest x-ray although she denies any shortness of breath or chest pain at this time.  Chest x-ray with possible pneumonia versus atelectasis.  Patient was just recently admitted and she does report a little bit of shortness of breath and given the leg swelling will get CT PE to make sure no evidence of pulmonary embolism.  Her abdomen is soft and nontender.  Ammonia is normal.  CMP shows stably elevated creatinine.  CBC shows slightly elevated white count.  Glucose normal  Patient will be handed off to oncoming team pending CTs, MRI  If work-up is negative I suspect this could be related to the prednisone and I think it would be okay to discharge patient after ambulatory trial given she appears pretty oriented at this time and not acutely confused   FINAL CLINICAL IMPRESSION(S) / ED DIAGNOSES   Final diagnoses:  Confusion     Rx / DC Orders   ED Discharge Orders     None        Note:  This document was prepared using Dragon voice recognition software and may include unintentional dictation errors.   Concha Se, MD 02/24/22 [phone]

## 2022-02-24 NOTE — ED Triage Notes (Signed)
Per family pt was placed on prednisone x 2 days and pt has been acting different and confused x 2 days. Pt alert, oriented x 4, NAD at this time, per family pt is more disoriented at night and in the morning but is better during the day. Pt with c/o not sleeping well.  Pt with right sided facial droop when smiling. Per family pt is slurring words at times. With this RN pt is communicating clearly and per family this is pt's baseline.  Last known well unknown, as family member states that this has been going on for at least 2 days.

## 2022-02-24 NOTE — ED Notes (Signed)
Granddaughter called and updated that pt will be discharged.

## 2022-02-24 NOTE — Discharge Instructions (Signed)
You have been seen in the emergency department today.  Your work-up has shown normal results including a normal MRI and normal CT scan of the chest.  Your CT scan of the chest does show several mildly enlarged lymph nodes we do recommend you follow-up with your doctor for ongoing monitoring of this.  Return to the emergency department for any weakness or numbness of any arm or leg any confusion or any difficulty speaking.

## 2022-02-24 NOTE — ED Notes (Signed)
Bg 170

## 2022-12-21 IMAGING — CT CT HEAD W/O CM
4 series · 16 of 47 positions shown, 18 images · non-contrast
Comparison: CT head 11/18/2021

CLINICAL DATA: Head trauma



[Series 3: head bone · axial · 0.40mm/px · z∈[-128,-100]mm · 3 of 73 slices shown]
[im 8/73  bone]
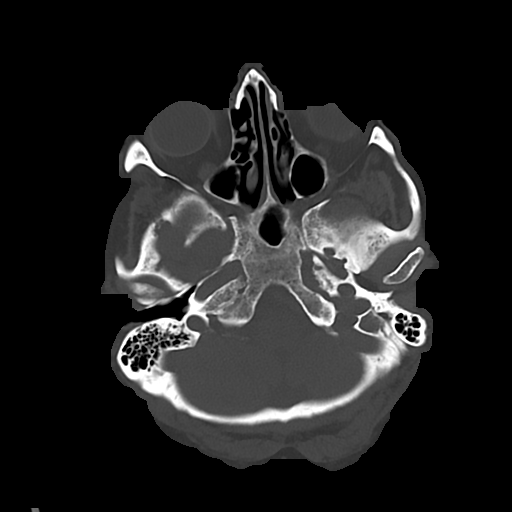
[im 15/73  bone]
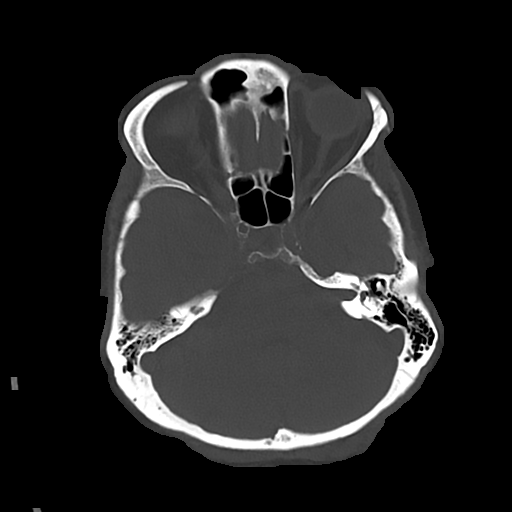
[im 22/73  bone]
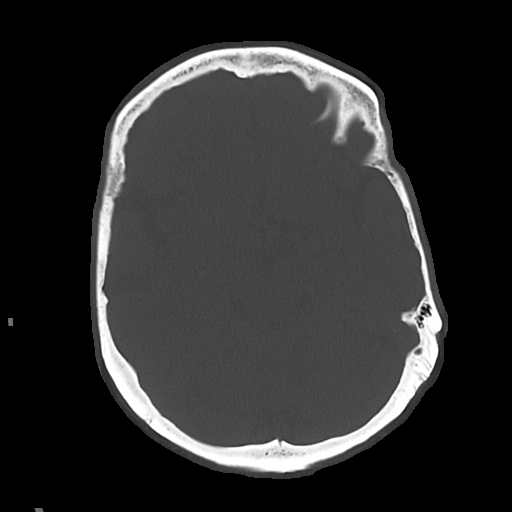

[Series 4: head wo · axial · 0.40mm/px · z∈[-126,-16]mm · 7 of 30 slices shown, 9 images]
[im 4/30  brain]
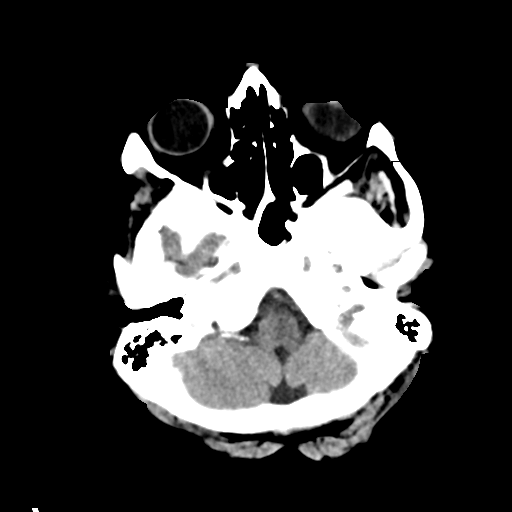
[im 4/30  bone]
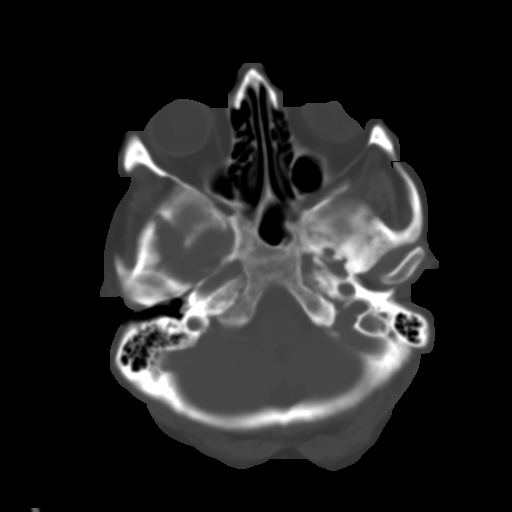
[im 8/30  brain]
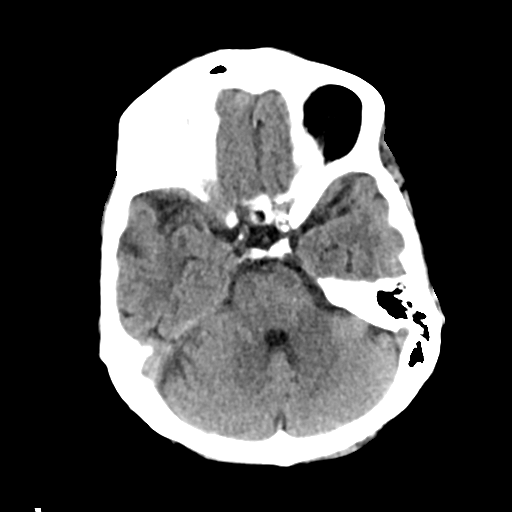
[im 11/30  brain]
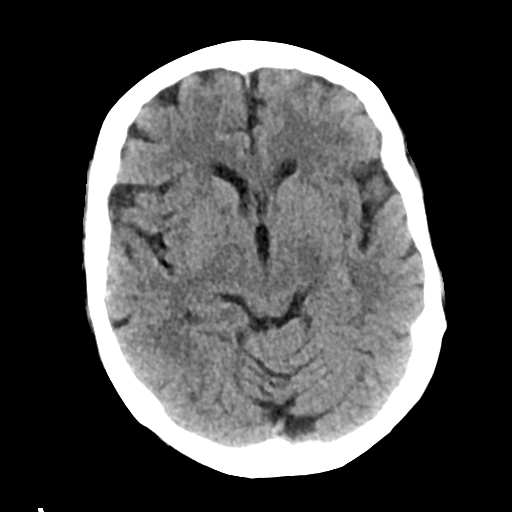
[im 15/30  brain]
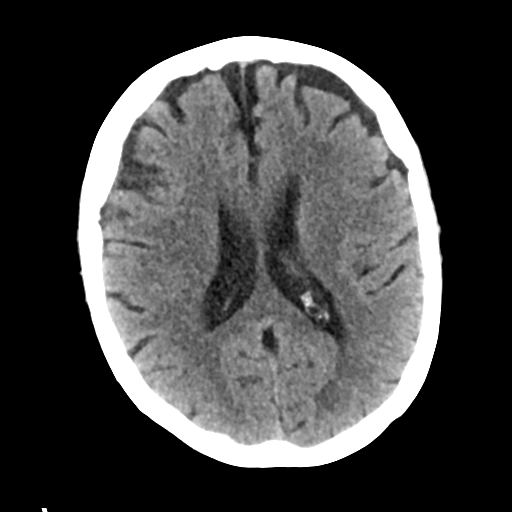
[im 19/30  brain]
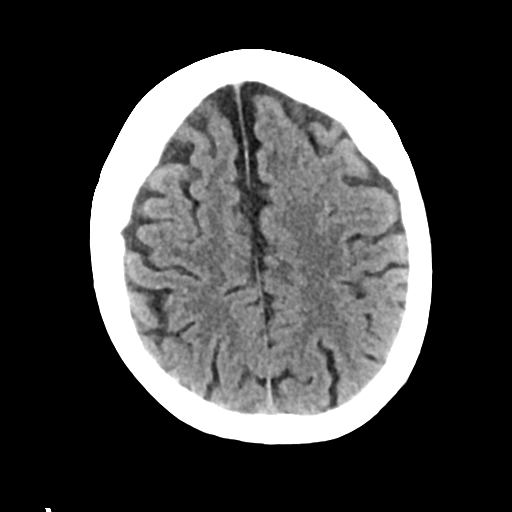
[im 19/30  bone]
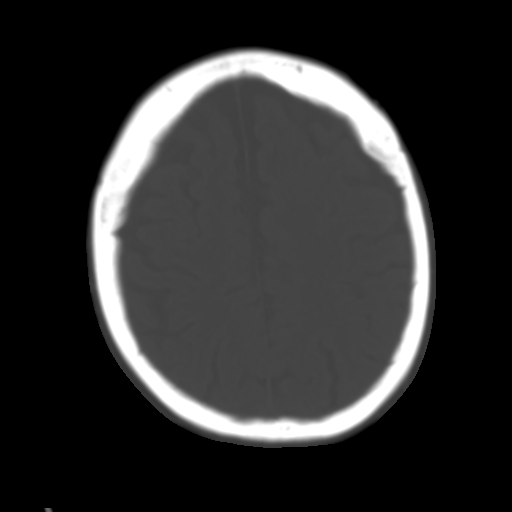
[im 22/30  brain]
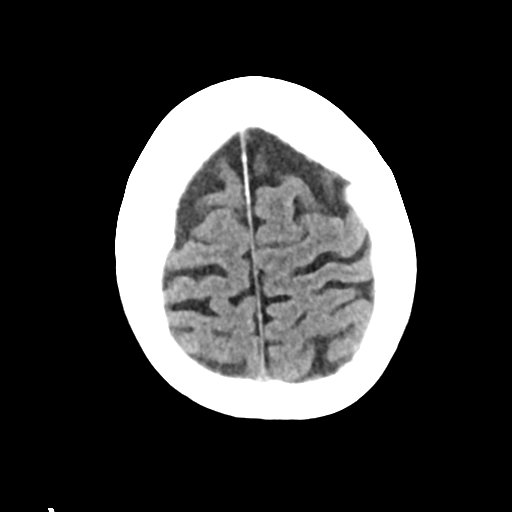
[im 26/30  brain]
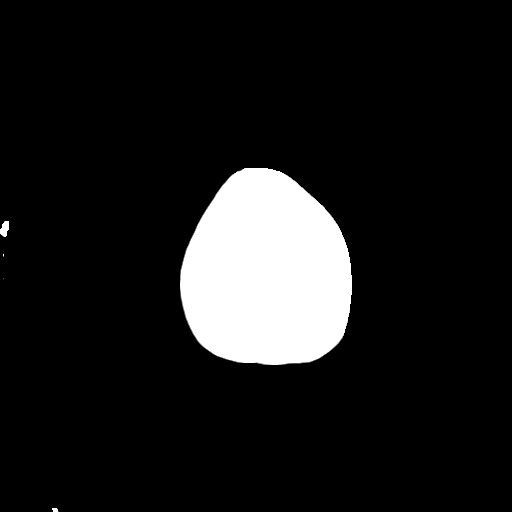

[Series 5: cor soft · coronal · 0.34mm/px · 3 of 60 slices shown]
[im 20/60  brain]
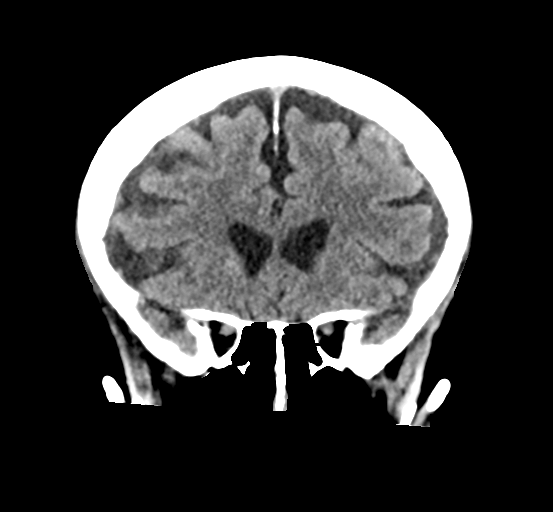
[im 27/60  brain]
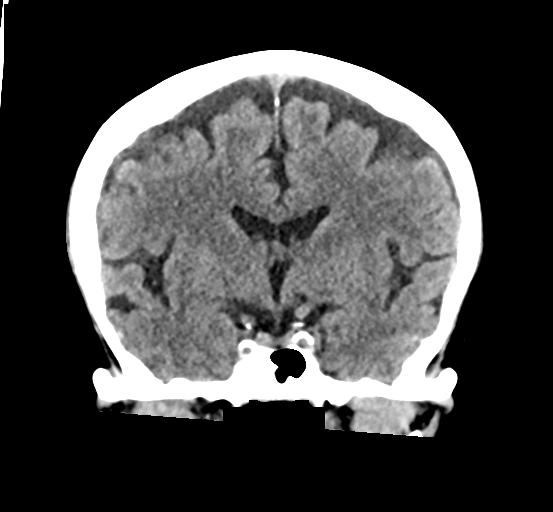
[im 33/60  brain]
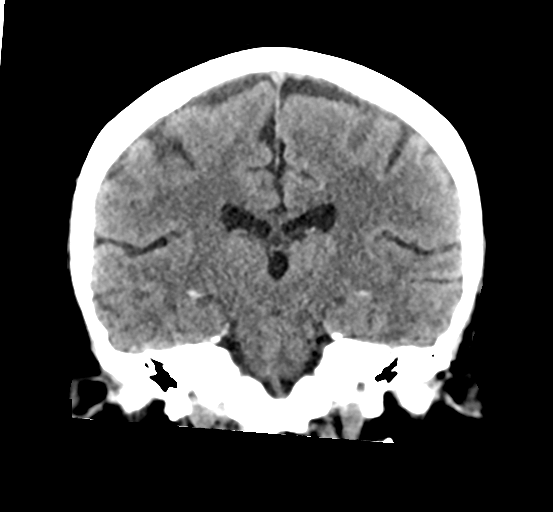

[Series 6: sag soft · sagittal · 0.34mm/px · 3 of 52 slices shown]
[im 18/52  brain]
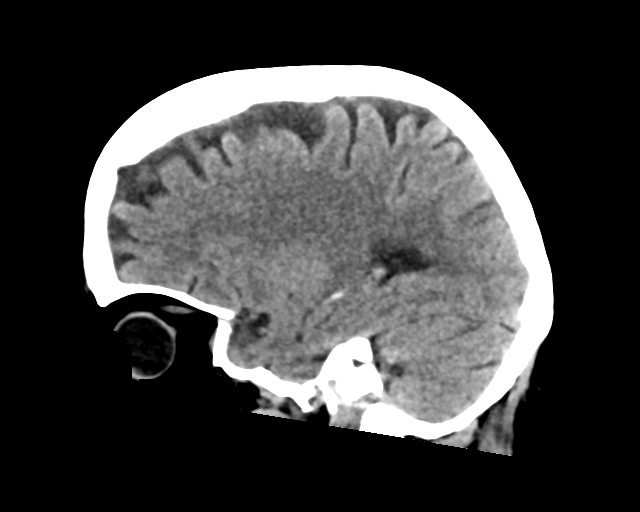
[im 26/52  brain]
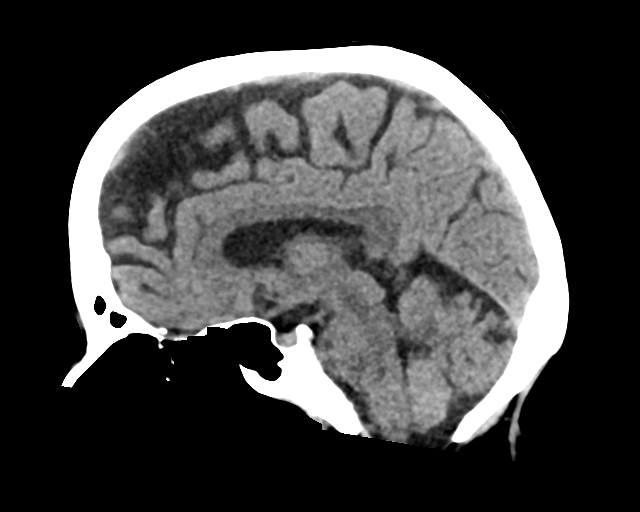
[im 35/52  brain]
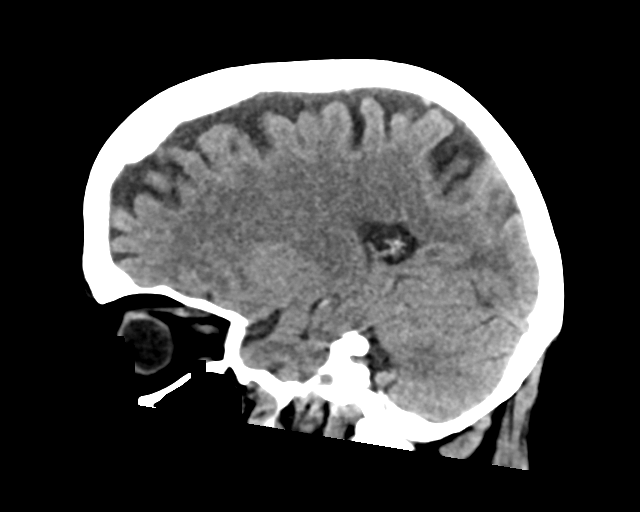

[16 of 47 positions shown; findings below may reference images not displayed]

FINDINGS: Brain: No acute intracranial hemorrhage, mass effect, or herniation.
No extra-axial fluid collections. No evidence of acute territorial
infarct. No hydrocephalus. Moderate cortical volume loss. Mild
patchy hypodensities in the periventricular and subcortical white
matter, likely secondary to chronic microvascular ischemic changes.

Vascular: Calcified plaques in the carotid siphons.

Skull: Normal. Negative for fracture or focal lesion.

Sinuses/Orbits: No acute finding.

Other: None.
IMPRESSION: Chronic changes with no acute intracranial process identified.

## 2022-12-21 IMAGING — CT CT ANGIO CHEST
3 of 7 series · 18 of 36 positions shown · IV contrast (APPLIED)
Comparison: None Available.

CLINICAL DATA: Concern for pulmonary embolism.

EXAM:
CT ANGIOGRAPHY CHEST WITH CONTRAST
TECHNIQUE: Multidetector CT imaging of the chest was performed using the
standard protocol during bolus administration of intravenous
contrast. Multiplanar CT image reconstructions and MIPs were
obtained to evaluate the vascular anatomy.

[Series 7: lung · axial · 0.70mm/px · z∈[-400,-290]mm · 3 of 138 slices shown]
[im 28/138  mediastinal]
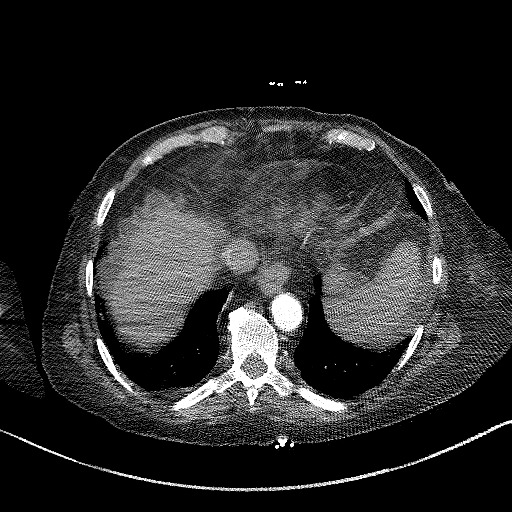
[im 55/138  mediastinal]
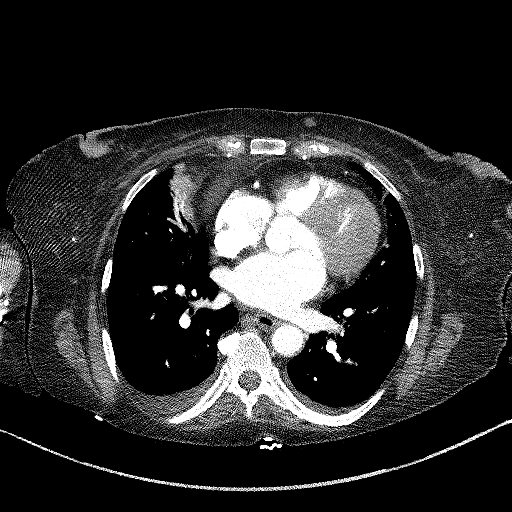
[im 83/138  mediastinal]
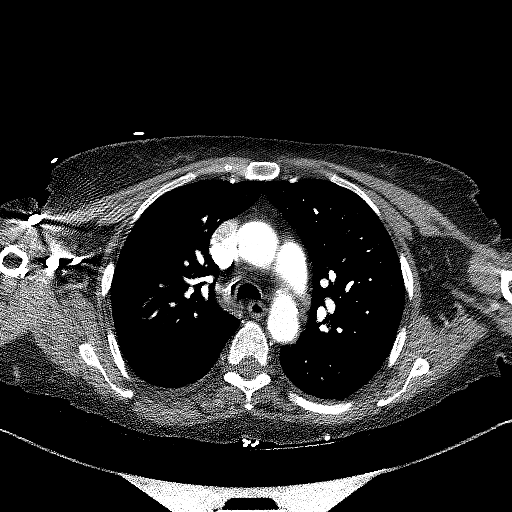

[Series 8: thins · axial · 0.70mm/px · z∈[-436,-199]mm · 14 of 393 slices shown]
[im 27/393  lung]
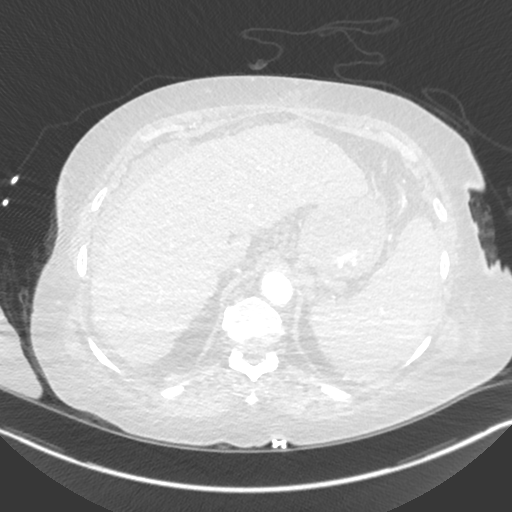
[im 53/393  mediastinal]
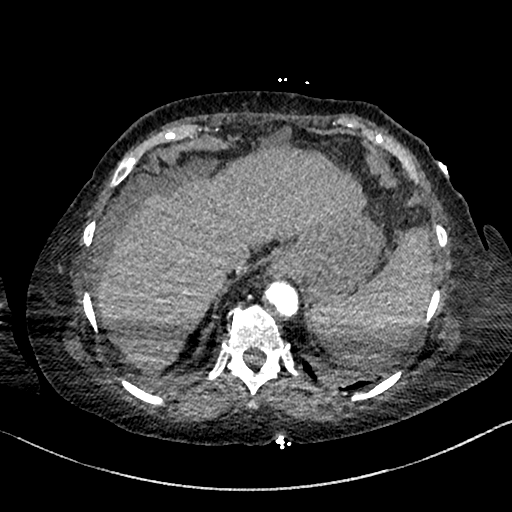
[im 79/393  lung]
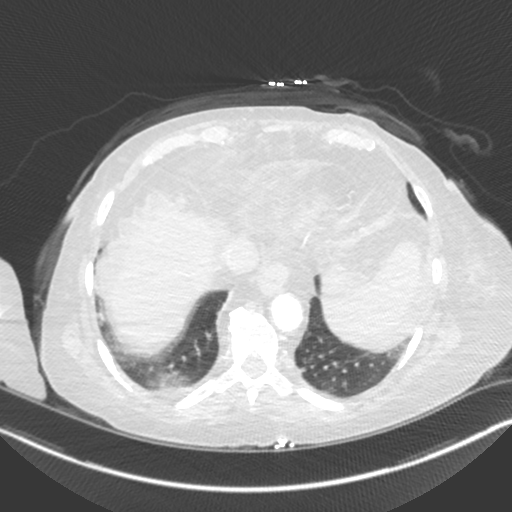
[im 105/393  mediastinal]
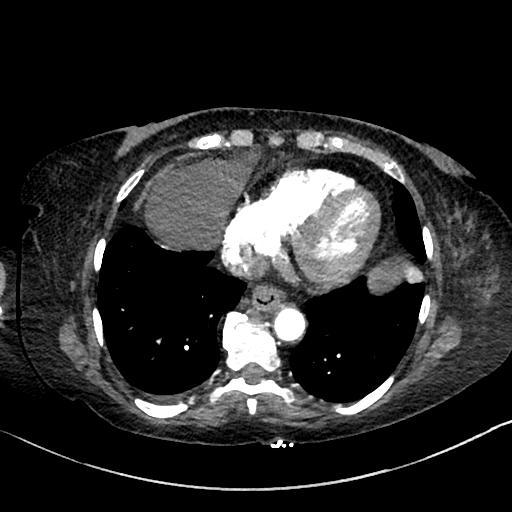
[im 131/393  lung]
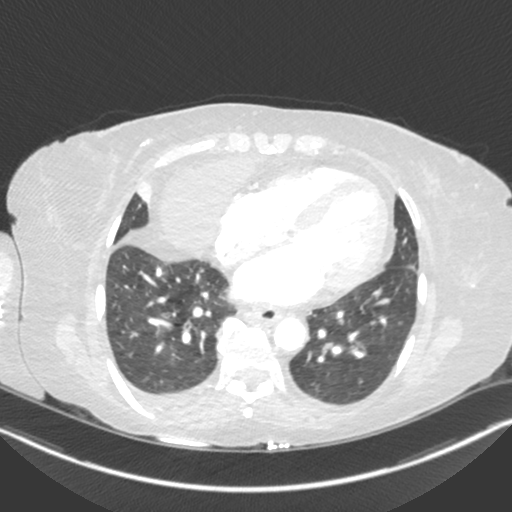
[im 157/393  mediastinal]
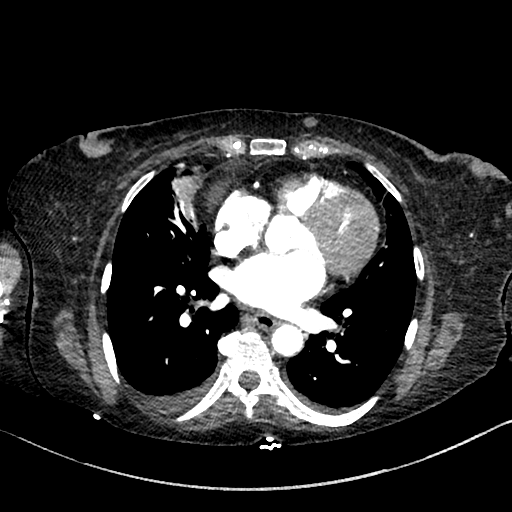
[im 183/393  lung]
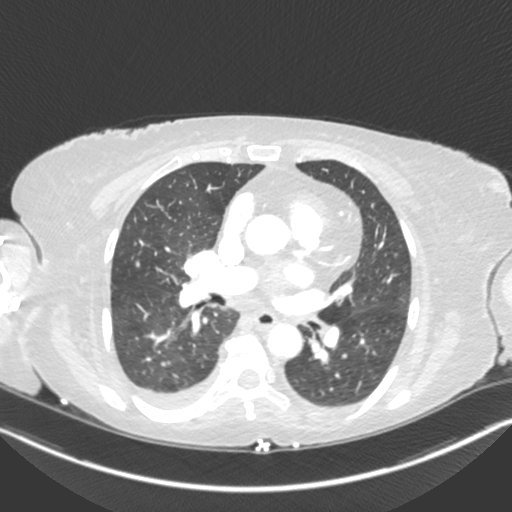
[im 210/393  mediastinal]
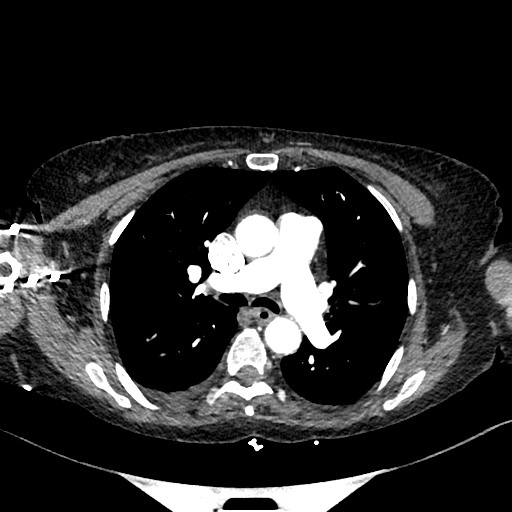
[im 236/393  lung]
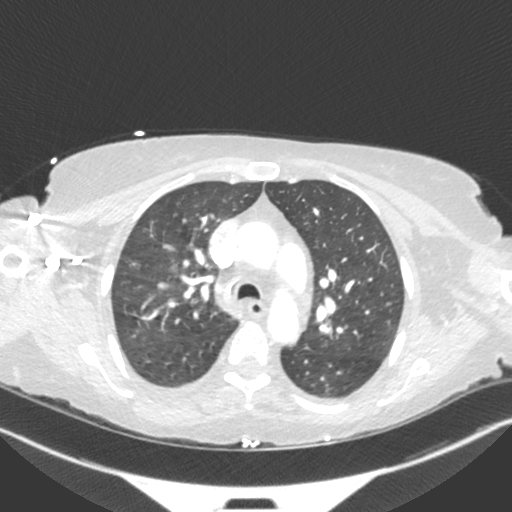
[im 262/393  mediastinal]
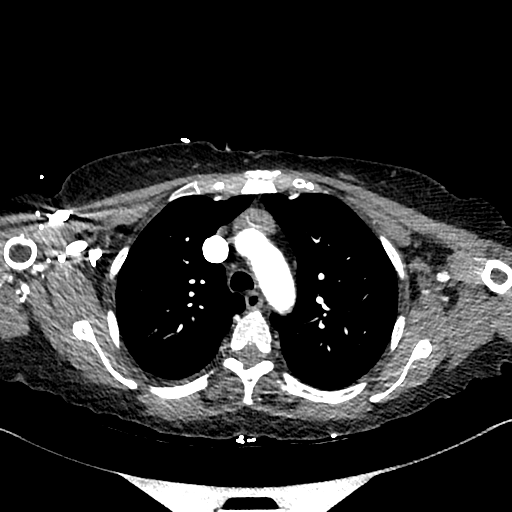
[im 288/393  lung]
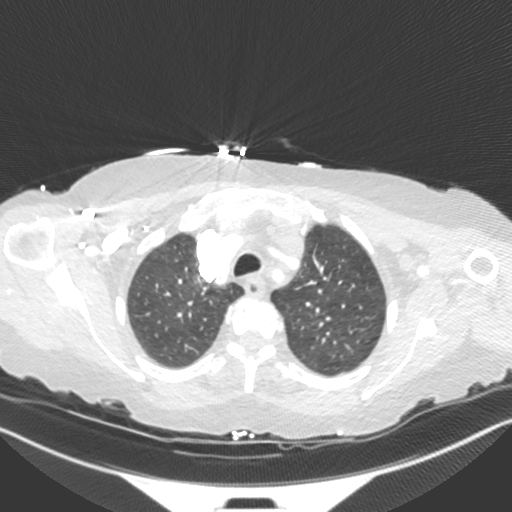
[im 314/393  mediastinal]
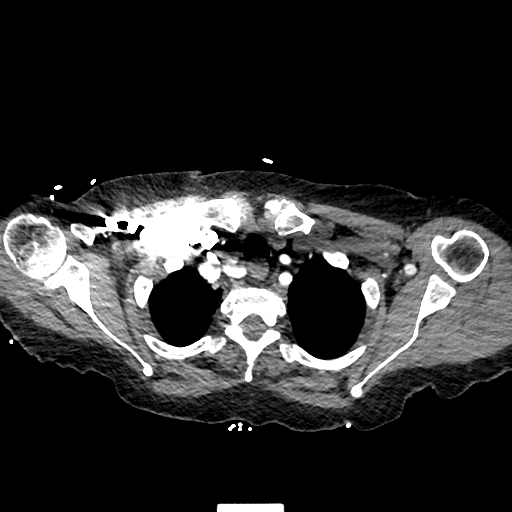
[im 340/393  lung]
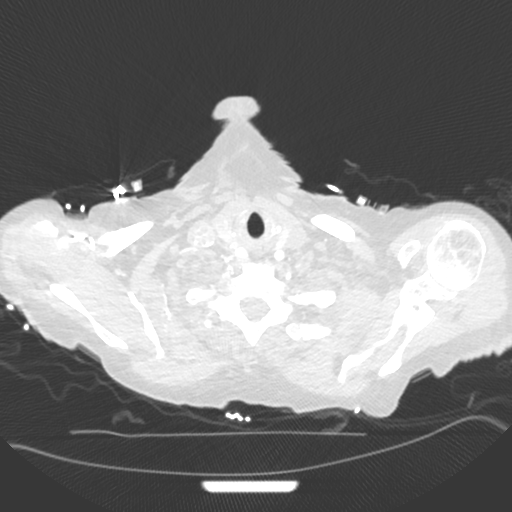
[im 366/393  mediastinal]
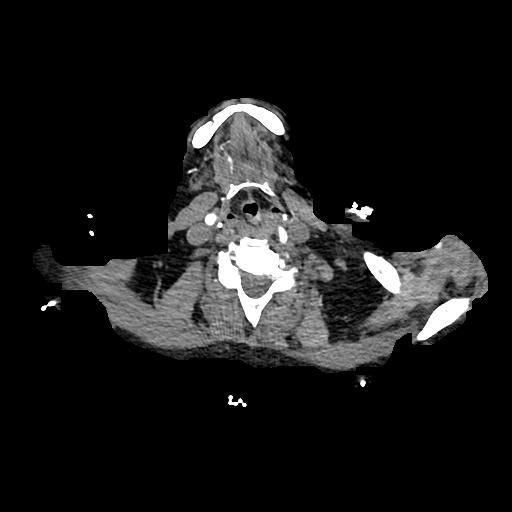

[Series 9: cor · coronal · 0.62mm/px · 1 of 121 slices shown]
[im 61/121  mediastinal]
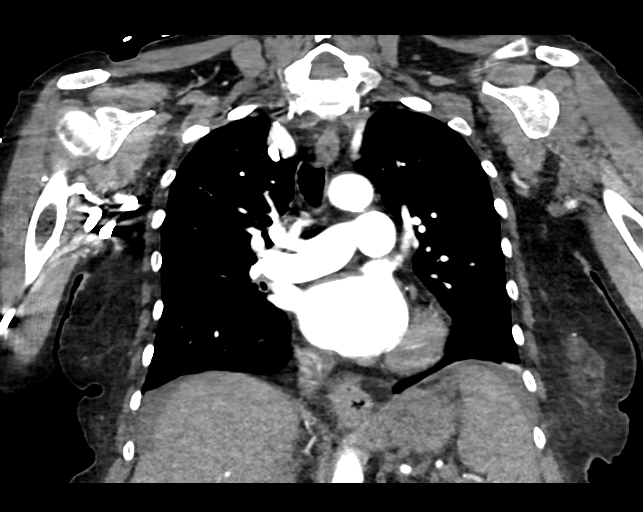

[18 of 36 positions shown; findings below may reference images not displayed]

RADIATION DOSE REDUCTION: This exam was performed according to the
departmental dose-optimization program which includes automated
exposure control, adjustment of the mA and/or kV according to
patient size and/or use of iterative reconstruction technique.

CONTRAST:  75mL OMNIPAQUE IOHEXOL 350 MG/ML SOLN
FINDINGS: Cardiovascular: No filling defects within the pulmonary arteries to
suggest acute pulmonary embolism.

Mediastinum/Nodes: No axillary or supraclavicular adenopathy. No
mediastinal or hilar adenopathy. No pericardial fluid. Esophagus
normal.

Lungs/Pleura: Small RIGHT pleural effusion. Scattered pulmonary
nodules. Example nodule in the LEFT upper lobe measures 4 mm (51/7).
Example nodule in the RIGHT lower lobe measures 3 mm on image 88.
Comparison CT from 4785 there were multiple bilateral larger
pulmonary nodules.

Upper Abdomen: Limited view of the liver, kidneys, pancreas are
unremarkable. Normal adrenal glands.

Liver has a nodular contour. Small amount of fluid surrounding the
liver and spleen.

Musculoskeletal: No aggressive osseous lesion.

Review of the MIP images confirms the above findings.
IMPRESSION: 1. No evidence acute pulmonary embolism.
2. Small RIGHT effusion.
3. Small bilateral pulmonary nodules. Nodularity is markedly
improved from CT scan 12/01/2017. No follow-up needed if patient is
low-risk.This recommendation follows the consensus statement:
Guidelines for Management of Incidental Pulmonary Nodules Detected
4. Nodule liver suggests cirrhosis.
5. Small volume ascites the upper abdomen.

## 2023-08-26 ENCOUNTER — Other Ambulatory Visit: Payer: Self-pay

## 2023-08-26 ENCOUNTER — Encounter: Payer: Self-pay | Admitting: Emergency Medicine

## 2023-08-26 DIAGNOSIS — Z66 Do not resuscitate: Secondary | ICD-10-CM | POA: Diagnosis present

## 2023-08-26 DIAGNOSIS — Z820 Family history of epilepsy and other diseases of the nervous system: Secondary | ICD-10-CM

## 2023-08-26 DIAGNOSIS — J440 Chronic obstructive pulmonary disease with acute lower respiratory infection: Secondary | ICD-10-CM | POA: Diagnosis present

## 2023-08-26 DIAGNOSIS — I2489 Other forms of acute ischemic heart disease: Secondary | ICD-10-CM | POA: Diagnosis present

## 2023-08-26 DIAGNOSIS — Z825 Family history of asthma and other chronic lower respiratory diseases: Secondary | ICD-10-CM

## 2023-08-26 DIAGNOSIS — E1122 Type 2 diabetes mellitus with diabetic chronic kidney disease: Secondary | ICD-10-CM | POA: Diagnosis present

## 2023-08-26 DIAGNOSIS — Z8249 Family history of ischemic heart disease and other diseases of the circulatory system: Secondary | ICD-10-CM

## 2023-08-26 DIAGNOSIS — D509 Iron deficiency anemia, unspecified: Secondary | ICD-10-CM | POA: Diagnosis not present

## 2023-08-26 DIAGNOSIS — K429 Umbilical hernia without obstruction or gangrene: Secondary | ICD-10-CM | POA: Diagnosis present

## 2023-08-26 DIAGNOSIS — J9811 Atelectasis: Secondary | ICD-10-CM | POA: Diagnosis present

## 2023-08-26 DIAGNOSIS — I130 Hypertensive heart and chronic kidney disease with heart failure and stage 1 through stage 4 chronic kidney disease, or unspecified chronic kidney disease: Secondary | ICD-10-CM | POA: Diagnosis present

## 2023-08-26 DIAGNOSIS — K7460 Unspecified cirrhosis of liver: Secondary | ICD-10-CM | POA: Diagnosis present

## 2023-08-26 DIAGNOSIS — N1831 Chronic kidney disease, stage 3a: Secondary | ICD-10-CM | POA: Diagnosis present

## 2023-08-26 DIAGNOSIS — J189 Pneumonia, unspecified organism: Secondary | ICD-10-CM | POA: Diagnosis present

## 2023-08-26 DIAGNOSIS — Z7951 Long term (current) use of inhaled steroids: Secondary | ICD-10-CM

## 2023-08-26 DIAGNOSIS — Z823 Family history of stroke: Secondary | ICD-10-CM

## 2023-08-26 DIAGNOSIS — R531 Weakness: Secondary | ICD-10-CM | POA: Diagnosis not present

## 2023-08-26 DIAGNOSIS — Z79899 Other long term (current) drug therapy: Secondary | ICD-10-CM

## 2023-08-26 DIAGNOSIS — Z885 Allergy status to narcotic agent status: Secondary | ICD-10-CM

## 2023-08-26 DIAGNOSIS — Z888 Allergy status to other drugs, medicaments and biological substances status: Secondary | ICD-10-CM

## 2023-08-26 DIAGNOSIS — I4510 Unspecified right bundle-branch block: Secondary | ICD-10-CM | POA: Diagnosis present

## 2023-08-26 DIAGNOSIS — Z7985 Long-term (current) use of injectable non-insulin antidiabetic drugs: Secondary | ICD-10-CM

## 2023-08-26 DIAGNOSIS — Z882 Allergy status to sulfonamides status: Secondary | ICD-10-CM

## 2023-08-26 DIAGNOSIS — I5033 Acute on chronic diastolic (congestive) heart failure: Secondary | ICD-10-CM | POA: Diagnosis not present

## 2023-08-26 LAB — CBG MONITORING, ED: Glucose-Capillary: 113 mg/dL — ABNORMAL HIGH (ref 70–99)

## 2023-08-26 LAB — URINALYSIS, ROUTINE W REFLEX MICROSCOPIC
Bilirubin Urine: NEGATIVE
Glucose, UA: NEGATIVE mg/dL
Hgb urine dipstick: NEGATIVE
Ketones, ur: NEGATIVE mg/dL
Leukocytes,Ua: NEGATIVE
Nitrite: NEGATIVE
Protein, ur: NEGATIVE mg/dL
Specific Gravity, Urine: 1.012 (ref 1.005–1.030)
pH: 7 (ref 5.0–8.0)

## 2023-08-26 LAB — CBC
HCT: 21.8 % — ABNORMAL LOW (ref 36.0–46.0)
Hemoglobin: 6.5 g/dL — ABNORMAL LOW (ref 12.0–15.0)
MCH: 22.6 pg — ABNORMAL LOW (ref 26.0–34.0)
MCHC: 29.8 g/dL — ABNORMAL LOW (ref 30.0–36.0)
MCV: 75.7 fL — ABNORMAL LOW (ref 80.0–100.0)
Platelets: 141 10*3/uL — ABNORMAL LOW (ref 150–400)
RBC: 2.88 MIL/uL — ABNORMAL LOW (ref 3.87–5.11)
RDW: 17.7 % — ABNORMAL HIGH (ref 11.5–15.5)
WBC: 5.1 10*3/uL (ref 4.0–10.5)
nRBC: 0 % (ref 0.0–0.2)

## 2023-08-26 LAB — BASIC METABOLIC PANEL
Anion gap: 7 (ref 5–15)
BUN: 24 mg/dL — ABNORMAL HIGH (ref 8–23)
CO2: 30 mmol/L (ref 22–32)
Calcium: 8.3 mg/dL — ABNORMAL LOW (ref 8.9–10.3)
Chloride: 96 mmol/L — ABNORMAL LOW (ref 98–111)
Creatinine, Ser: 1.34 mg/dL — ABNORMAL HIGH (ref 0.44–1.00)
GFR, Estimated: 40 mL/min — ABNORMAL LOW (ref >60)
Glucose, Bld: 130 mg/dL — ABNORMAL HIGH (ref 70–99)
Potassium: 4.2 mmol/L (ref 3.5–5.1)
Sodium: 133 mmol/L — ABNORMAL LOW (ref 135–145)

## 2023-08-26 LAB — TROPONIN I (HIGH SENSITIVITY)
Troponin I (High Sensitivity): 32 ng/L — ABNORMAL HIGH (ref <18)
Troponin I (High Sensitivity): 27 ng/L — ABNORMAL HIGH (ref <18)

## 2023-08-26 NOTE — ED Notes (Signed)
Patient noted to have hematoma forming under skin of hand where blood was drawn.  Patient given ice pack to assist with the swelling.

## 2023-08-26 NOTE — ED Triage Notes (Signed)
Patient to ED via [facility] from home for generalized weakness and dizziness x2 weeks. States she has had trouble walk because of same. Hx of HTN, COPD, and diabetes.

## 2023-08-27 ENCOUNTER — Emergency Department: Payer: Medicare HMO

## 2023-08-27 ENCOUNTER — Inpatient Hospital Stay
Admission: EM | Admit: 2023-08-27 | Discharge: 2023-08-29 | DRG: 811 | Disposition: A | Discharge status: Home / Self Care | Payer: Medicare HMO | Attending: Internal Medicine | Admitting: Internal Medicine

## 2023-08-27 ENCOUNTER — Inpatient Hospital Stay: Payer: Medicare HMO

## 2023-08-27 DIAGNOSIS — R531 Weakness: Secondary | ICD-10-CM | POA: Diagnosis present

## 2023-08-27 DIAGNOSIS — I4510 Unspecified right bundle-branch block: Secondary | ICD-10-CM | POA: Diagnosis present

## 2023-08-27 DIAGNOSIS — Z888 Allergy status to other drugs, medicaments and biological substances status: Secondary | ICD-10-CM | POA: Diagnosis not present

## 2023-08-27 DIAGNOSIS — J449 Chronic obstructive pulmonary disease, unspecified: Secondary | ICD-10-CM | POA: Diagnosis present

## 2023-08-27 DIAGNOSIS — N1831 Chronic kidney disease, stage 3a: Secondary | ICD-10-CM | POA: Insufficient documentation

## 2023-08-27 DIAGNOSIS — Z825 Family history of asthma and other chronic lower respiratory diseases: Secondary | ICD-10-CM | POA: Diagnosis not present

## 2023-08-27 DIAGNOSIS — R7989 Other specified abnormal findings of blood chemistry: Secondary | ICD-10-CM | POA: Diagnosis present

## 2023-08-27 DIAGNOSIS — Z885 Allergy status to narcotic agent status: Secondary | ICD-10-CM | POA: Diagnosis not present

## 2023-08-27 DIAGNOSIS — Z7985 Long-term (current) use of injectable non-insulin antidiabetic drugs: Secondary | ICD-10-CM | POA: Diagnosis not present

## 2023-08-27 DIAGNOSIS — I5033 Acute on chronic diastolic (congestive) heart failure: Secondary | ICD-10-CM | POA: Diagnosis not present

## 2023-08-27 DIAGNOSIS — Z882 Allergy status to sulfonamides status: Secondary | ICD-10-CM | POA: Diagnosis not present

## 2023-08-27 DIAGNOSIS — Z66 Do not resuscitate: Secondary | ICD-10-CM | POA: Diagnosis present

## 2023-08-27 DIAGNOSIS — K7460 Unspecified cirrhosis of liver: Secondary | ICD-10-CM | POA: Diagnosis present

## 2023-08-27 DIAGNOSIS — J189 Pneumonia, unspecified organism: Secondary | ICD-10-CM

## 2023-08-27 DIAGNOSIS — I130 Hypertensive heart and chronic kidney disease with heart failure and stage 1 through stage 4 chronic kidney disease, or unspecified chronic kidney disease: Secondary | ICD-10-CM | POA: Diagnosis present

## 2023-08-27 DIAGNOSIS — J9811 Atelectasis: Secondary | ICD-10-CM | POA: Diagnosis present

## 2023-08-27 DIAGNOSIS — D649 Anemia, unspecified: Principal | ICD-10-CM | POA: Diagnosis present

## 2023-08-27 DIAGNOSIS — Z8249 Family history of ischemic heart disease and other diseases of the circulatory system: Secondary | ICD-10-CM | POA: Diagnosis not present

## 2023-08-27 DIAGNOSIS — I5032 Chronic diastolic (congestive) heart failure: Secondary | ICD-10-CM

## 2023-08-27 DIAGNOSIS — Z820 Family history of epilepsy and other diseases of the nervous system: Secondary | ICD-10-CM | POA: Diagnosis not present

## 2023-08-27 DIAGNOSIS — E119 Type 2 diabetes mellitus without complications: Secondary | ICD-10-CM

## 2023-08-27 DIAGNOSIS — E1122 Type 2 diabetes mellitus with diabetic chronic kidney disease: Secondary | ICD-10-CM | POA: Diagnosis present

## 2023-08-27 DIAGNOSIS — I2489 Other forms of acute ischemic heart disease: Secondary | ICD-10-CM | POA: Diagnosis present

## 2023-08-27 DIAGNOSIS — Z823 Family history of stroke: Secondary | ICD-10-CM | POA: Diagnosis not present

## 2023-08-27 DIAGNOSIS — K429 Umbilical hernia without obstruction or gangrene: Secondary | ICD-10-CM | POA: Diagnosis present

## 2023-08-27 DIAGNOSIS — Z7951 Long term (current) use of inhaled steroids: Secondary | ICD-10-CM | POA: Diagnosis not present

## 2023-08-27 DIAGNOSIS — Z79899 Other long term (current) drug therapy: Secondary | ICD-10-CM | POA: Diagnosis not present

## 2023-08-27 DIAGNOSIS — J440 Chronic obstructive pulmonary disease with acute lower respiratory infection: Secondary | ICD-10-CM | POA: Diagnosis present

## 2023-08-27 DIAGNOSIS — I10 Essential (primary) hypertension: Secondary | ICD-10-CM | POA: Diagnosis present

## 2023-08-27 DIAGNOSIS — D509 Iron deficiency anemia, unspecified: Secondary | ICD-10-CM | POA: Diagnosis present

## 2023-08-27 DIAGNOSIS — R0609 Other forms of dyspnea: Secondary | ICD-10-CM | POA: Diagnosis not present

## 2023-08-27 LAB — RESPIRATORY PANEL BY PCR

## 2023-08-27 LAB — HEPATIC FUNCTION PANEL
ALT: 19 U/L (ref 0–44)
AST: 31 U/L (ref 15–41)
Albumin: 3 g/dL — ABNORMAL LOW (ref 3.5–5.0)
Alkaline Phosphatase: 77 U/L (ref 38–126)
Bilirubin, Direct: 0.2 mg/dL (ref 0.0–0.2)
Indirect Bilirubin: 1.1 mg/dL — ABNORMAL HIGH (ref 0.3–0.9)
Total Bilirubin: 1.3 mg/dL — ABNORMAL HIGH (ref <1.2)
Total Protein: 6.4 g/dL — ABNORMAL LOW (ref 6.5–8.1)

## 2023-08-27 LAB — CBG MONITORING, ED
Glucose-Capillary: 112 mg/dL — ABNORMAL HIGH (ref 70–99)
Glucose-Capillary: 99 mg/dL (ref 70–99)
Glucose-Capillary: 93 mg/dL (ref 70–99)
Glucose-Capillary: 171 mg/dL — ABNORMAL HIGH (ref 70–99)

## 2023-08-27 LAB — IRON AND TIBC
Iron: 72 ug/dL (ref 28–170)
Saturation Ratios: 15 % (ref 10.4–31.8)
TIBC: 486 ug/dL — ABNORMAL HIGH (ref 250–450)
UIBC: 414 ug/dL

## 2023-08-27 LAB — GLUCOSE, CAPILLARY: Glucose-Capillary: 116 mg/dL — ABNORMAL HIGH (ref 70–99)

## 2023-08-27 LAB — FOLATE: Folate: 12.9 ng/mL (ref >5.9)

## 2023-08-27 LAB — PREPARE RBC (CROSSMATCH)

## 2023-08-27 LAB — STREP PNEUMONIAE URINARY ANTIGEN: Strep Pneumo Urinary Antigen: NEGATIVE

## 2023-08-27 LAB — PROTIME-INR
INR: 1.1 (ref 0.8–1.2)
Prothrombin Time: 14.4 s (ref 11.4–15.2)

## 2023-08-27 LAB — RESP PANEL BY RT-PCR (RSV, FLU A&B, COVID)  RVPGX2
Influenza A by PCR: NEGATIVE
Influenza B by PCR: NEGATIVE
Resp Syncytial Virus by PCR: NEGATIVE
SARS Coronavirus 2 by RT PCR: NEGATIVE

## 2023-08-27 LAB — ABO/RH: ABO/RH(D): A POS

## 2023-08-27 LAB — FERRITIN: Ferritin: 4 ng/mL — ABNORMAL LOW (ref 11–307)

## 2023-08-27 LAB — VITAMIN B12: Vitamin B-12: 315 pg/mL (ref 180–914)

## 2023-08-27 LAB — HEMOGLOBIN A1C
Hgb A1c MFr Bld: 5.5 % (ref 4.8–5.6)
Mean Plasma Glucose: 111.15 mg/dL

## 2023-08-27 LAB — RETICULOCYTES
Immature Retic Fract: 28 % — ABNORMAL HIGH (ref 2.3–15.9)
RBC.: 3.47 MIL/uL — ABNORMAL LOW (ref 3.87–5.11)
Retic Count, Absolute: 60.4 10*3/uL (ref 19.0–186.0)
Retic Ct Pct: 1.7 % (ref 0.4–3.1)

## 2023-08-27 LAB — HEMOGLOBIN AND HEMATOCRIT, BLOOD
HCT: 26.5 % — ABNORMAL LOW (ref 36.0–46.0)
Hemoglobin: 8.4 g/dL — ABNORMAL LOW (ref 12.0–15.0)

## 2023-08-27 LAB — BRAIN NATRIURETIC PEPTIDE: B Natriuretic Peptide: 718 pg/mL — ABNORMAL HIGH (ref 0.0–100.0)

## 2023-08-27 MED ORDER — GUAIFENESIN ER 600 MG PO TB12
600.0000 mg | ORAL_TABLET | Freq: Two times a day (BID) | ORAL | Status: DC | PRN
  Filled 2023-08-27: qty 1

## 2023-08-27 MED ORDER — ONDANSETRON HCL 4 MG/2ML IJ SOLN: 4.0000 mg | Freq: Four times a day (QID) | INTRAMUSCULAR | Status: DC | PRN

## 2023-08-27 MED ORDER — FUROSEMIDE 10 MG/ML IJ SOLN
80.0000 mg | Freq: Once | INTRAMUSCULAR | Status: AC
  Administered 2023-08-27: 80 mg via INTRAVENOUS
  Filled 2023-08-27: qty 8

## 2023-08-27 MED ORDER — BUDESONIDE-FORMOTEROL FUMARATE 160-4.5 MCG/ACT IN AERO
2.0000 | INHALATION_SPRAY | Freq: Two times a day (BID) | RESPIRATORY_TRACT | Status: DC
  Administered 2023-08-28 – 2023-08-29 (×4): 2 via RESPIRATORY_TRACT
  Filled 2023-08-27: qty 6

## 2023-08-27 MED ORDER — IPRATROPIUM BROMIDE HFA 17 MCG/ACT IN AERS
2.0000 | INHALATION_SPRAY | RESPIRATORY_TRACT | Status: DC | PRN
Start: 2023-08-27 — End: 2023-08-29
  Administered 2023-08-28: 2 via RESPIRATORY_TRACT
  Filled 2023-08-27: qty 12.9

## 2023-08-27 MED ORDER — SODIUM CHLORIDE 0.9 % IV SOLN: 500.0000 mg | Freq: Once | INTRAVENOUS | Status: DC

## 2023-08-27 MED ORDER — SODIUM CHLORIDE 0.9 % IV SOLN
500.0000 mg | INTRAVENOUS | Status: DC
  Administered 2023-08-27 – 2023-08-29 (×3): 500 mg via INTRAVENOUS
  Filled 2023-08-27 (×3): qty 5

## 2023-08-27 MED ORDER — ATENOLOL 25 MG PO TABS
25.0000 mg | ORAL_TABLET | Freq: Every day | ORAL | Status: DC
  Administered 2023-08-27 – 2023-08-28 (×2): 25 mg via ORAL
  Filled 2023-08-27 (×2): qty 1

## 2023-08-27 MED ORDER — METHYLPREDNISOLONE SODIUM SUCC 40 MG IJ SOLR
40.0000 mg | Freq: Every day | INTRAMUSCULAR | Status: DC
  Administered 2023-08-27 – 2023-08-28 (×2): 40 mg via INTRAVENOUS
  Filled 2023-08-27 (×2): qty 1

## 2023-08-27 MED ORDER — ACETAMINOPHEN 325 MG PO TABS
650.0000 mg | ORAL_TABLET | Freq: Four times a day (QID) | ORAL | Status: DC | PRN
  Administered 2023-08-28: 650 mg via ORAL
  Filled 2023-08-27: qty 2

## 2023-08-27 MED ORDER — PANTOPRAZOLE SODIUM 40 MG IV SOLR
40.0000 mg | Freq: Two times a day (BID) | INTRAVENOUS | Status: DC
  Administered 2023-08-27 – 2023-08-29 (×5): 40 mg via INTRAVENOUS
  Filled 2023-08-27 (×5): qty 10

## 2023-08-27 MED ORDER — INSULIN ASPART 100 UNIT/ML IJ SOLN
0.0000 [IU] | INTRAMUSCULAR | Status: DC
  Administered 2023-08-27: 3 [IU] via SUBCUTANEOUS
  Administered 2023-08-28: 2 [IU] via SUBCUTANEOUS
  Administered 2023-08-28: 3 [IU] via SUBCUTANEOUS
  Filled 2023-08-27 (×3): qty 1

## 2023-08-27 MED ORDER — SODIUM CHLORIDE 0.9 % IV SOLN
2.0000 g | INTRAVENOUS | Status: DC
  Administered 2023-08-27 – 2023-08-29 (×3): 2 g via INTRAVENOUS
  Filled 2023-08-27 (×3): qty 20

## 2023-08-27 MED ORDER — IOHEXOL 300 MG/ML  SOLN
100.0000 mL | Freq: Once | INTRAMUSCULAR | Status: AC | PRN
  Administered 2023-08-27: 100 mL via INTRAVENOUS

## 2023-08-27 MED ORDER — SODIUM CHLORIDE 0.9 % IV SOLN
10.0000 mL/h | Freq: Once | INTRAVENOUS | Status: AC
  Administered 2023-08-27: 10 mL/h via INTRAVENOUS

## 2023-08-27 MED ORDER — LISINOPRIL 20 MG PO TABS
20.0000 mg | ORAL_TABLET | Freq: Every day | ORAL | Status: DC
  Administered 2023-08-27 – 2023-08-29 (×3): 20 mg via ORAL
  Filled 2023-08-27: qty 2
  Filled 2023-08-27 (×2): qty 1

## 2023-08-27 MED ORDER — IPRATROPIUM-ALBUTEROL 0.5-2.5 (3) MG/3ML IN SOLN: 3.0000 mL | RESPIRATORY_TRACT | Status: DC | PRN

## 2023-08-27 MED ORDER — ACETAMINOPHEN 650 MG RE SUPP: 650.0000 mg | Freq: Four times a day (QID) | RECTAL | Status: DC | PRN

## 2023-08-27 MED ORDER — ONDANSETRON HCL 4 MG PO TABS: 4.0000 mg | ORAL_TABLET | Freq: Four times a day (QID) | ORAL | Status: DC | PRN

## 2023-08-27 MED ORDER — AMLODIPINE BESYLATE 10 MG PO TABS
10.0000 mg | ORAL_TABLET | Freq: Every day | ORAL | Status: DC
  Administered 2023-08-27 – 2023-08-29 (×3): 10 mg via ORAL
  Filled 2023-08-27 (×2): qty 1
  Filled 2023-08-27: qty 2

## 2023-08-27 MED ORDER — SODIUM CHLORIDE 0.9 % IV SOLN: 2.0000 g | Freq: Once | INTRAVENOUS | Status: DC

## 2023-08-27 NOTE — ED Provider Notes (Signed)
[facility] Provider Note    Event Date/Time   First MD Initiated Contact with Patient 08/27/23 0028     (approximate)   History   Weakness   HPI  Kendra Clarke is a 82 y.o. female with history of COPD, asthma, hypertension, diabetes, CHF who presents to the emergency department complaints of generalized weakness.  Also having sore throat, cough and congestion for several days.  No chest pain or shortness of breath.  No vomiting or diarrhea.  No bloody stools or melena.  No fever.   History provided by patient.    Past Medical History:  Diagnosis Date   Arthritis    Asthma    COPD (chronic obstructive pulmonary disease) (HCC)    Diabetes mellitus without complication (HCC)    Hypertension    Liver disease    Umbilical hernia     Past Surgical History:  Procedure Laterality Date   ABDOMINAL HYSTERECTOMY     BLADDER SURGERY     CHOLECYSTECTOMY     nose surgery      MEDICATIONS:  Prior to Admission medications   Medication Sig Start Date End Date Taking? Authorizing Provider  amLODipine (NORVASC) 10 MG tablet Take 10 mg by mouth daily. 09/24/17   [provider]  atenolol (TENORMIN) 25 MG tablet Take 1 tablet (25 mg total) by mouth daily. 11/21/21 12/21/21  Leeroy Bock, MD  ATROVENT HFA 17 MCG/ACT inhaler Inhale into the lungs. 02/16/22   [provider]  Dulaglutide (TRULICITY) 0.75 MG/0.5ML SOPN Inject 0.75 mg into the skin once a week. 12/16/18   [provider]  lisinopril (ZESTRIL) 20 MG tablet Take by mouth. 09/03/18   [provider]  methimazole (TAPAZOLE) 5 MG tablet Take 1 tablet (5 mg total) by mouth daily. 11/21/21 12/21/21  Leeroy Bock, MD  potassium chloride (KLOR-CON) 10 MEQ tablet Take 30 mEq by mouth daily. 02/13/22   [provider]  SYMBICORT 160-4.5 MCG/ACT inhaler INHALE 2 PUFFS TWICE DAILY FOR ASTHMA 12/02/20   [provider]  torsemide (DEMADEX) 20 MG tablet Take  40 mg by mouth daily. 02/13/22   [provider]    Physical Exam   Triage Vital Signs: ED Triage Vitals [08/26/23 1703]  Encounter Vitals Group     BP 124/61     Systolic BP Percentile      Diastolic BP Percentile      Pulse Rate 74     Resp 18     Temp 99 F (37.2 C)     Temp Source Oral     SpO2 95 %     Weight 180 lb (81.6 kg)     Height 5\' 4"  (1.626 m)     Head Circumference      Peak Flow      Pain Score 5     Pain Loc      Pain Education      Exclude from Growth Chart     Most recent vital signs: Vitals:   08/27/23 0030 08/27/23 0144  BP: (!) 166/79   Pulse: 77   Resp: 17   Temp:  98.3 F (36.8 C)  SpO2: 100%     CONSTITUTIONAL: Alert, responds appropriately to questions. Well-appearing; well-nourished HEAD: Normocephalic, atraumatic EYES: Conjunctivae clear, pupils appear equal, sclera nonicteric ENT: normal nose; moist mucous membranes; No pharyngeal erythema or petechiae, no tonsillar hypertrophy or exudate, no uvular deviation, no unilateral swelling in posterior oropharynx, no trismus or  drooling, no muffled voice, hoarse voice, no stridor, airway patent. NECK: Supple, normal ROM CARD: RRR; S1 and S2 appreciated RESP: Normal chest excursion without splinting or tachypnea; breath sounds clear and equal bilaterally; no wheezes, no rhonchi, no rales, no hypoxia or respiratory distress, speaking full sentences ABD/GI: Non-distended; soft, non-tender, no rebound, no guarding, no peritoneal signs, large abdominal hernia that is reducible and nontender to palpation RECTAL:  Normal rectal tone, no gross blood or melena, guaiac negative, no hemorrhoids appreciated, nontender rectal exam, no fecal impaction. Chaperone present. BACK: The back appears normal EXT: Normal ROM in all joints; no deformity noted, Tronic 2+ pitting edema to bilateral lower extremities to the level of the knee, no calf tenderness or calf swelling SKIN: Normal color for age and race;  warm; no rash on exposed skin NEURO: Moves all extremities equally, normal speech PSYCH: The patient's mood and manner are appropriate.   ED Results / Procedures / Treatments   LABS: (all labs ordered are listed, but only abnormal results are displayed) Labs Reviewed  BASIC METABOLIC PANEL - Abnormal; Notable for the following components:      Result Value   Sodium 133 (*)    Chloride 96 (*)    Glucose, Bld 130 (*)    BUN 24 (*)    Creatinine, Ser 1.34 (*)    Calcium 8.3 (*)    GFR, Estimated 40 (*)    All other components within normal limits  CBC - Abnormal; Notable for the following components:   RBC 2.88 (*)    Hemoglobin 6.5 (*)    HCT 21.8 (*)    MCV 75.7 (*)    MCH 22.6 (*)    MCHC 29.8 (*)    RDW 17.7 (*)    Platelets 141 (*)    All other components within normal limits  URINALYSIS, ROUTINE W REFLEX MICROSCOPIC - Abnormal; Notable for the following components:   Color, Urine YELLOW (*)    APPearance CLEAR (*)    All other components within normal limits  BRAIN NATRIURETIC PEPTIDE - Abnormal; Notable for the following components:   B Natriuretic Peptide 718.0 (*)    All other components within normal limits  CBG MONITORING, ED - Abnormal; Notable for the following components:   Glucose-Capillary 113 (*)    All other components within normal limits  TROPONIN I (HIGH SENSITIVITY) - Abnormal; Notable for the following components:   Troponin I (High Sensitivity) 32 (*)    All other components within normal limits  TROPONIN I (HIGH SENSITIVITY) - Abnormal; Notable for the following components:   Troponin I (High Sensitivity) 27 (*)    All other components within normal limits  RESP PANEL BY RT-PCR (RSV, FLU A&B, COVID)  RVPGX2  PROTIME-INR  VITAMIN B12  FOLATE  IRON AND TIBC  FERRITIN  RETICULOCYTES  TYPE AND SCREEN  PREPARE RBC (CROSSMATCH)  ABO/RH     EKG:  EKG Interpretation Date/Time:  Monday August 26 2023 17:13:02 EST Ventricular Rate:  81 PR  Interval:  160 QRS Duration:  116 QT Interval:  402 QTC Calculation: 466 R Axis:   107  Text Interpretation: Normal sinus rhythm Incomplete right bundle branch block Right ventricular hypertrophy with repolarization abnormality Abnormal ECG When compared with ECG of 24-Feb-2022 13:29, Incomplete right bundle branch block is now Present Confirmed by Rochele Raring [phone]) on 08/27/2023 12:50:06 AM         RADIOLOGY: My personal review and interpretation of imaging: Chest x-ray shows small right  pleural effusion with possible infiltrate.  I have personally reviewed all radiology reports.   DG Chest Portable 1 View  Result Date: 08/27/2023 CLINICAL DATA:  Cough, shortness of breath EXAM: PORTABLE CHEST 1 VIEW COMPARISON:  02/24/2022 FINDINGS: Heart and mediastinal contours within normal limits. Small right pleural effusion. Right basilar atelectasis or infiltrate. Left lung clear. No acute bony abnormality. IMPRESSION: Small right pleural effusion with right basilar atelectasis or infiltrate. Electronically Signed   By: Charlett Nose M.D.   On: 08/27/2023 01:13     PROCEDURES:  Critical Care performed: Yes, see critical care procedure note(s)   CRITICAL CARE Performed by: Baxter Hire Casimir Barcellos   Total critical care time: 40 minutes  Critical care time was exclusive of separately billable procedures and treating other patients.  Critical care was necessary to treat or prevent imminent or life-threatening deterioration.  Critical care was time spent personally by me on the following activities: development of treatment plan with patient and/or surrogate as well as nursing, discussions with consultants, evaluation of patient's response to treatment, examination of patient, obtaining history from patient or surrogate, ordering and performing treatments and interventions, ordering and review of laboratory studies, ordering and review of radiographic studies, pulse oximetry and re-evaluation of  patient's condition.   Marland Kitchen1-3 Lead EKG Interpretation  Performed by: Cannie Muckle, Layla Maw, DO Authorized by: Avin Upperman, Layla Maw, DO     Interpretation: normal     ECG rate:  77   ECG rate assessment: normal     Rhythm: sinus rhythm     Ectopy: none     Conduction: normal       IMPRESSION / MDM / ASSESSMENT AND PLAN / ED COURSE  I reviewed the triage vital signs and the nursing notes.    Patient here with cough, congestion, weakness.  Found to have a hemoglobin of 6.5.  The patient is on the cardiac monitor to evaluate for evidence of arrhythmia and/or significant heart rate changes.   DIFFERENTIAL DIAGNOSIS (includes but not limited to):   Anemia, electrolyte derangement, dehydration, UTI, ACS, arrhythmia, pneumonia, viral URI   Patient's presentation is most consistent with acute presentation with potential threat to life or bodily function.   PLAN: Labs from triage show hemoglobin of 6.5.  Was 12 a little over a year ago.  She reports she has had to have blood transfusions before.  She does have mild chronic kidney disease which could be the cause of this.  She has no signs of bleeding and she is guaiac negative on digital rectal exam.  INR normal.  Platelets slightly decreased to 141,000.  Will transfuse 2 units of packed red blood cells.  First troponin minimally elevated at 32 but second is downtrending to 27.  Urine shows no sign of infection or dehydration.  Chest x-ray reviewed and interpreted by myself and the radiologist and shows small right pleural effusion with atelectasis versus infiltrate.  Given her cough, congestion, I am worried this could be pneumonia.  Will start her on Rocephin and azithromycin to cover for community-acquired pneumonia.  COVID, flu and RSV test are pending.   MEDICATIONS GIVEN IN ED: Medications  0.9 %  sodium chloride infusion (has no administration in time range)  cefTRIAXone (ROCEPHIN) 2 g in sodium chloride 0.9 % 100 mL IVPB (has no  administration in time range)  azithromycin (ZITHROMAX) 500 mg in sodium chloride 0.9 % 250 mL IVPB (has no administration in time range)     ED COURSE: COVID, flu and RSV  negative.   CONSULTS:  Consulted and discussed patient's case with hospitalist, Dr. Para March.  I have recommended admission and consulting physician agrees and will place admission orders.  Patient (and family if present) agree with this plan.   I reviewed all nursing notes, vitals, pertinent previous records.  All labs, EKGs, imaging ordered have been independently reviewed and interpreted by myself.    OUTSIDE RECORDS REVIEWED: Reviewed last admission in June 2023 for COPD.       FINAL CLINICAL IMPRESSION(S) / ED DIAGNOSES   Final diagnoses:  Symptomatic anemia  Generalized weakness  Community acquired pneumonia, unspecified laterality     Rx / DC Orders   ED Discharge Orders     None        Note:  This document was prepared using Dragon voice recognition software and may include unintentional dictation errors.   Pranay Hilbun, Layla Maw, DO 08/27/23 [phone]

## 2023-08-27 NOTE — ED Notes (Signed)
This tech notified Molli Hazard, RN about pt's CBG of 171 mg/dL.

## 2023-08-27 NOTE — Consult Note (Addendum)
GI Inpatient Consult Note  Reason for Consult: Symptomatic anemia and possible PNA   Attending Requesting Consult: Dr. Para March  History of Present Illness: Kendra Clarke is a 82 y.o. female seen for evaluation of symptomatic anemia at the request of Dr. Para March. Patient has a PMH of COPD, asthma, hypertension, diabetes, CHF who presents to the emergency department complaints of generalized weakness, dizziness, DOE, sore throat and congestion for 2 week duration. Upon presentation to the ED, vital signs were 99, 74, 18, 124/61. Labs were significant for WBC 5.1  Hgb 6.5, NA 133, BUN 24, Cr 1.34, GFR 40, troponin 27-32, BNP 718. Imaging studies revealed.   ED course and data review: Low-grade temp of 99 with otherwise normal vitals Workup notable for the following.Hemoglobin 6.5 down from 12 a year ago with negative guaiac. Normal WBC with negative respiratory viral panel.  Troponin 27 with BNP 718 Creatinine 1.34 which is at baseline. Urinalysis sterile. EKG showing NSR at 81 with incomplete RBBB and no ischemic ST-T wave changes. Chest x-ray and showing small right pleural effusion with right basilar atelectasis or infiltrate.She was started on Rocephin and azithromycin for possible pneumonia. She has received her second unit  PRBC. Repeat Hgb ordered for 0900.   Reports  that she has no upper or lower GI complaints.  She has noted no heartburn, reflux, dysphagia, nausea or vomiting.  No epigastric pain.  No unintended weight loss.  She has had weight gain but she attributes that to a flare of CHF and fluid retention.  She has noticed poor than normal appetite.  She snacks mostly.  Blood sugars have been within normal.  Reports bowel habits are normal formed stool without any melena.  No lower abdominal pain cramping bloating or hematochezia.  Last Colonoscopy: [facility] approx 2012 - 16mm SSP, multiple TVA, TA, SSP Last Endoscopy: 2012- [facility] negative gastritis   Past Medical History:  Past Medical  History:  Diagnosis Date   Arthritis    Asthma    COPD (chronic obstructive pulmonary disease) (HCC)    Diabetes mellitus without complication (HCC)    Hypertension    Liver disease    Umbilical hernia     Problem List: Patient Active Problem List   Diagnosis Date Noted   Symptomatic anemia 08/27/2023   CAP (community acquired pneumonia) 08/27/2023   Chronic kidney disease, stage 3a (HCC) 08/27/2023   Chronic diastolic CHF (congestive heart failure) (HCC) 08/27/2023   Acute respiratory failure with hypoxia (HCC) 02/19/2022   Protein-calorie malnutrition, moderate (HCC) 02/19/2022   AKI (acute kidney injury) (HCC) 02/19/2022   Hypoxia    Edema    Abdominal pain    Bronchitis 11/12/2021   HTN (hypertension) 11/12/2021   HLD (hyperlipidemia) 11/12/2021   COPD (chronic obstructive pulmonary disease) (HCC) 11/12/2021   Diabetes mellitus without complication (HCC) 11/12/2021   Fall at home, initial encounter 11/12/2021   Hyponatremia 11/12/2021   Hypokalemia 11/12/2021   Disorder of electrolytes 11/12/2021   Elevated troponin 11/12/2021   Nausea & vomiting 11/12/2021   Hypomagnesemia 11/12/2021   Acute respiratory disease due to COVID-19 virus 11/12/2021   Varicose veins of leg with pain, 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   Superficial thrombophlebitis 01/13/2021   Essential hypertension, benign 01/13/2021   Diabetes (HCC) 01/13/2021   Grief at loss of child 12/02/2017   Sepsis (HCC) 12/01/2017    Past Surgical History: Past Surgical History:  Procedure Laterality Date   ABDOMINAL HYSTERECTOMY     BLADDER SURGERY  CHOLECYSTECTOMY     nose surgery      Allergies: Allergies  Allergen Reactions   Albuterol Anaphylaxis and Rash    Other reaction(s): Other (See Comments) Patient states it gives her an asthma attack Other reaction(s): Other (See Comments), Respiratory Distress Patient states it gives her an asthma attack  Other reaction(s): Other (See Comments) Patient states it  gives her an asthma attack    Codeine Anxiety and Shortness Of Breath   Tramadol Hives   Sulfa Antibiotics Rash    Home Medications: (Not in a hospital admission)  Home medication reconciliation was completed with the patient.   Scheduled Inpatient Medications:    amLODipine  10 mg Oral Daily   atenolol  25 mg Oral Daily   insulin aspart  0-15 Units Subcutaneous Q4H   lisinopril  20 mg Oral Daily    Continuous Inpatient Infusions:    azithromycin Stopped (08/27/23 0505)   cefTRIAXone (ROCEPHIN)  IV Stopped (08/27/23 0322)    PRN Inpatient Medications:  acetaminophen **OR** acetaminophen, guaiFENesin, ipratropium-albuterol, ondansetron **OR** ondansetron (ZOFRAN) IV  Family History: family history includes COPD in her father; Hypertension in her father; Multiple sclerosis in her mother; Stroke in her nephew; Varicose Veins in her maternal aunt.  The patient's family history is negative for inflammatory bowel disorders, GI malignancy, or solid organ transplantation.  Social History:   reports that she has never smoked. She has never used smokeless tobacco. She reports that she does not drink alcohol and does not use drugs. The patient denies ETOH, tobacco, or drug use.   Review of Systems: Constitutional: Weight is elevated, appetite is fair- snacks   Eyes: No changes in vision. ENT: No oral lesions, sore throat.  GI: see HPI.  Heme/Lymph: No easy bruising.  CV: Positive chest pain last week - none now.  GU: No hematuria.  Integumentary: No rashes.  Neuro: No headaches.  Psych: No depression/anxiety.  Endocrine: No heat/cold intolerance.  Allergic/Immunologic: No urticaria.  Resp: No cough, SOB.  Musculoskeletal: No joint swelling.    Physical Examination: BP 137/60   Pulse 83   Temp 98.2 F (36.8 C)   Resp 17   Ht 5\' 4"  (1.626 m)   Wt 81.6 kg   SpO2 96%   BMI 30.90 kg/m  Gen: NAD, alert and oriented x  Neck: supple, thyromegaly Chest: CTA bilaterally, no  wheezes, crackles, or other adventitious sounds CV: RRR, positive systolic murmur Abd: soft, NT, ND, +BS in all four quadrants; very large umbilical hernia (soft, but non reducible), non tender, no guarding, rigidity, or rebound tenderness Ext: 3 + pitting lower ext edema Skin: no rash or lesions noted Lymph: no LAD  Data: Lab Results  Component Value Date   WBC 5.1 08/26/2023   HGB 6.5 (L) 08/26/2023   HCT 21.8 (L) 08/26/2023   MCV 75.7 (L) 08/26/2023   PLT 141 (L) 08/26/2023   Recent Labs  Lab 08/26/23 1705  HGB 6.5*   Lab Results  Component Value Date   NA 133 (L) 08/26/2023   K 4.2 08/26/2023   CL 96 (L) 08/26/2023   CO2 30 08/26/2023   BUN 24 (H) 08/26/2023   CREATININE 1.34 (H) 08/26/2023   Lab Results  Component Value Date   ALT 19 08/27/2023   AST 31 08/27/2023   ALKPHOS 77 08/27/2023   BILITOT 1.3 (H) 08/27/2023   Recent Labs  Lab 08/27/23 0034  INR 1.1   Assessment/Plan: Ms. Barton is a 82 y.o. female with  symptomatic anemia no overt signs of GI bleeding and advanced CHF with possible resp infection.  She denies any melena or hematochezia.  In fact, she denies any upper or lower GI complaints.  No anticoagulation or NSAID use. Historical Trulicity use.   Discussed luminal evaluation with her to obtain source for possible GI bleed to include PUD, malignancy, colon polyp, AVMs and patient declines at this time.  She reports that with advanced age, history of heart failure she feels like she is getting closer to going home and does not want to do testing at this time.  In addition, patient reports that she has been advised not to be put to sleep by her primary care provider due to her congestive heart failure.  Recommendations: Advance diet to clear liquid diet  Protonix 40 mg iv q12 h Hold dvt ppx Monitor H&H.  Transfusion and resuscitation as per primary team Avoid frequent lab draws to prevent lab induced anemia Supportive care and antiemetics as per  primary team Maintain two sites IV access Avoid nsaids Monitor for GIB. She is going to think about having an EGD/csy and will let us know. We will need improvement in Cardio/Resp status before sedation for luminal evaluation.  With no overt signs of GI bleeding and anemia that is chronic - we can consider outpt scopes if needed.     Thank you for the consult. Please call with questions or concerns.  Amedeo Kinsman, ANP [facility] [phone]

## 2023-08-27 NOTE — Assessment & Plan Note (Signed)
Sliding scale insulin coverage 

## 2023-08-27 NOTE — Progress Notes (Signed)
Patient received to the unit alert and oriented x 4. Ambulated to the restroom with standby assistance of one staff and a rolling walker.   Kendra Clarke

## 2023-08-27 NOTE — ED Notes (Signed)
Assisted pt to restroom via w/c. Pt c/o shob with exertion. Pt SpO2 91% on RA when returned from restroom, improved to 97% on RA after sitting in bed.

## 2023-08-27 NOTE — ED Notes (Signed)
Pt assisted on bed pan d/t DOE

## 2023-08-27 NOTE — Progress Notes (Signed)
Interim progress note not for billing  I have reviewed the chart, seen and examined the patient, and reviewed the h and p. Agree with it unless otherwise stipulated.  Patient presents with generalized weakness and presyncope. Found to have hgb of 6.5 (no repeat performed to verify). Reports brown stool, no other bleeding, no nsaids, distant history of GI bleed she thinks from a bleeding polyp. 2 units prbcs ordered and have just finished transfusing, will check a hgb. Gi has been consulted, patient is considering whether to proceed with endoluminal eval. I will start IV PPI in the interim. She also reports some dyspnea. She has copd and her cxr shows a small pelural effusion with basilar atelectasis or infiltrate. She also has an elevated bnp and significant lower extremity edema. She has been started on abx (ceftriaxone/azith), I will continue those and will also check rvp (covid/flu neg), though it appears her copd and/or chf are more dominant players. Thus I will add a steroid (IV as she is npo) and will give a dose of IV lasix, also start strict I/os. I will also order a TTE as I do not see that one has been performed recently.

## 2023-08-27 NOTE — Assessment & Plan Note (Signed)
Clinically euvolemic Monitor for fluid overload with transfusions Continue lisinopril, atenolol, torsemide pending verification

## 2023-08-27 NOTE — H&P (Signed)
History and Physical    Patient: Kendra Clarke:[id number] DOB: [birth year] DOA: 08/27/2023 DOS: the patient was seen and examined on 08/27/2023 PCP: Shane Crutch, PA  Patient coming from: Home  Chief Complaint:  Chief Complaint  Patient presents with   Weakness    HPI: Kendra Clarke is a 82 y.o. female with medical history significant for COPD, diabetes, hypertension, diastolic CHF, , who presents to the ED with generalized weakness as well as cough and congestion.  She denies fever, chills, chest pain, lower extremity pain or swelling.  Denies lightheadedness, one-sided weakness numbness or tingling. ED course and data review: Low-grade temp of 99 with otherwise normal vitals Workup notable for the following: Hemoglobin 6.5 down from 12 a year ago with negative guaiac Normal WBC with negative respiratory viral panel Troponin 27 with BNP 718 Creatinine 1.34 which is at baseline Urinalysis sterile EKG, personally viewed and interpreted showing NSR at 81 with incomplete RBBB and no ischemic ST-T wave changes. Chest x-ray and showing small right pleural effusion with right basilar atelectasis or infiltrate  Patient started on Rocephin and azithromycin Started on 1 of 2 units PRBCs Hospitalist consulted for admission for symptomatic anemia and possible pneumonia   Review of Systems: As mentioned in the history of present illness. All other systems reviewed and are negative.  Past Medical History:  Diagnosis Date   Arthritis    Asthma    COPD (chronic obstructive pulmonary disease) (HCC)    Diabetes mellitus without complication (HCC)    Hypertension    Liver disease    Umbilical hernia    Past Surgical History:  Procedure Laterality Date   ABDOMINAL HYSTERECTOMY     BLADDER SURGERY     CHOLECYSTECTOMY     nose surgery     Social History:  reports that she has never smoked. She has never used smokeless tobacco. She reports that she does not drink alcohol and does  not use drugs.  Allergies  Allergen Reactions   Albuterol Anaphylaxis and Rash    Other reaction(s): Other (See Comments) Patient states it gives her an asthma attack Other reaction(s): Other (See Comments), Respiratory Distress Patient states it gives her an asthma attack  Other reaction(s): Other (See Comments) Patient states it gives her an asthma attack    Codeine Anxiety and Shortness Of Breath   Tramadol Hives   Sulfa Antibiotics Rash    Family History  Problem Relation Age of Onset   Multiple sclerosis Mother    Hypertension Father    COPD Father    Varicose Veins Maternal Aunt    Stroke Nephew     Prior to Admission medications   Medication Sig Start Date End Date Taking? Authorizing Provider  amLODipine (NORVASC) 10 MG tablet Take 10 mg by mouth daily. 09/24/17   [provider]  atenolol (TENORMIN) 25 MG tablet Take 1 tablet (25 mg total) by mouth daily. 11/21/21 12/21/21  Leeroy Bock, MD  ATROVENT HFA 17 MCG/ACT inhaler Inhale into the lungs. 02/16/22   [provider]  Dulaglutide (TRULICITY) 0.75 MG/0.5ML SOPN Inject 0.75 mg into the skin once a week. 12/16/18   [provider]  lisinopril (ZESTRIL) 20 MG tablet Take by mouth. 09/03/18   [provider]  methimazole (TAPAZOLE) 5 MG tablet Take 1 tablet (5 mg total) by mouth daily. 11/21/21 12/21/21  Leeroy Bock, MD  potassium chloride (KLOR-CON) 10 MEQ tablet Take 30 mEq by mouth daily. 02/13/22   [provider]  SYMBICORT 160-4.5 MCG/ACT inhaler INHALE 2 PUFFS TWICE DAILY FOR ASTHMA 12/02/20   [provider]  torsemide (DEMADEX) 20 MG tablet Take 40 mg by mouth daily. 02/13/22   [provider]    Physical Exam: Vitals:   08/26/23 1703 08/26/23 2202 08/27/23 0030 08/27/23 0144  BP: 124/61 106/83 (!) 166/79   Pulse: 74 78 77   Resp: 18 18 17    Temp: 99 F (37.2 C) 98.1 F (36.7 C)  98.3 F (36.8 C)  TempSrc: Oral Oral  Oral  SpO2: 95% 95%  100%   Weight: 81.6 kg     Height: 5\' 4"  (1.626 m)      Physical Exam Vitals and nursing note reviewed.  Constitutional:      General: She is not in acute distress. HENT:     Head: Normocephalic and atraumatic.  Cardiovascular:     Rate and Rhythm: Normal rate and regular rhythm.     Heart sounds: Normal heart sounds.  Pulmonary:     Effort: Pulmonary effort is normal.     Breath sounds: Normal breath sounds.  Abdominal:     Palpations: Abdomen is soft.     Tenderness: There is no abdominal tenderness.     Comments: Large umbilical hernia, nontender  Neurological:     Mental Status: Mental status is at baseline.     Labs on Admission: I have personally reviewed following labs and imaging studies  CBC: Recent Labs  Lab 08/26/23 1705  WBC 5.1  HGB 6.5*  HCT 21.8*  MCV 75.7*  PLT 141*   Basic Metabolic Panel: Recent Labs  Lab 08/26/23 1705  NA 133*  K 4.2  CL 96*  CO2 30  GLUCOSE 130*  BUN 24*  CREATININE 1.34*  CALCIUM 8.3*   GFR: Estimated Creatinine Clearance: 33.5 mL/min (A) (by C-G formula based on SCr of 1.34 mg/dL (H)). Liver Function Tests: No results for input(s): "AST", "ALT", "ALKPHOS", "BILITOT", "PROT", "ALBUMIN" in the last 168 hours. No results for input(s): "LIPASE", "AMYLASE" in the last 168 hours. No results for input(s): "AMMONIA" in the last 168 hours. Coagulation Profile: Recent Labs  Lab 08/27/23 0034  INR 1.1   Cardiac Enzymes: No results for input(s): "CKTOTAL", "CKMB", "CKMBINDEX", "TROPONINI" in the last 168 hours. BNP (last 3 results) No results for input(s): "PROBNP" in the last 8760 hours. HbA1C: No results for input(s): "HGBA1C" in the last 72 hours. CBG: Recent Labs  Lab 08/26/23 1935  GLUCAP 113*   Lipid Profile: No results for input(s): "CHOL", "HDL", "LDLCALC", "TRIG", "CHOLHDL", "LDLDIRECT" in the last 72 hours. Thyroid Function Tests: No results for input(s): "TSH", "T4TOTAL", "FREET4", "T3FREE", "THYROIDAB"  in the last 72 hours. Anemia Panel: No results for input(s): "VITAMINB12", "FOLATE", "FERRITIN", "TIBC", "IRON", "RETICCTPCT" in the last 72 hours. Urine analysis:    Component Value Date/Time   COLORURINE YELLOW (A) 08/26/2023 2202   APPEARANCEUR CLEAR (A) 08/26/2023 2202   LABSPEC 1.012 08/26/2023 2202   PHURINE 7.0 08/26/2023 2202   GLUCOSEU NEGATIVE 08/26/2023 2202   HGBUR NEGATIVE 08/26/2023 2202   BILIRUBINUR NEGATIVE 08/26/2023 2202   KETONESUR NEGATIVE 08/26/2023 2202   PROTEINUR NEGATIVE 08/26/2023 2202   NITRITE NEGATIVE 08/26/2023 2202   LEUKOCYTESUR NEGATIVE 08/26/2023 2202    Radiological Exams on Admission: DG Chest Portable 1 View  Result Date: 08/27/2023 CLINICAL DATA:  Cough, shortness of breath EXAM: PORTABLE CHEST 1 VIEW COMPARISON:  02/24/2022 FINDINGS: Heart and mediastinal contours within normal limits. Small right pleural  effusion. Right basilar atelectasis or infiltrate. Left lung clear. No acute bony abnormality. IMPRESSION: Small right pleural effusion with right basilar atelectasis or infiltrate. Electronically Signed   By: Charlett Nose M.D.   On: 08/27/2023 01:13     Data Reviewed: Relevant notes from primary care and specialist visits, past discharge summaries as available in EHR, including Care Everywhere. Prior diagnostic testing as pertinent to current admission diagnoses Updated medications and problem lists for reconciliation ED course, including vitals, labs, imaging, treatment and response to treatment Triage notes, nursing and pharmacy notes and ED provider's notes Notable results as noted in HPI   Assessment and Plan: * Symptomatic anemia Uncertain etiology, stool guaiac negative and patient reports no blood in stool or black stool Continue transfusion of 2 units PRBCs Will get an anemia panel Trend H&H following transfusion Last colonoscopy on record was from 2013 GI consulted  CAP (community acquired pneumonia) COPD,  stable Symptomatic for cough and congestion.  Chest x-ray with small pleural effusions and atelectasis versus infiltrate Will continue ceftriaxone and azithromycin Symptom control with antitussives Continue home inhalers DuoNebs as needed  Elevated troponin Mild troponin elevation, suspecting demand ischemia Continue to trend  Chronic diastolic CHF (congestive heart failure) (HCC) Clinically euvolemic Monitor for fluid overload with transfusions Continue lisinopril, atenolol, torsemide pending verification  Chronic kidney disease, stage 3a (HCC) Renal function at baseline per review  HTN (hypertension) Continue lisinopril, atenolol  Diabetes (HCC) Sliding scale insulin coverage     DVT prophylaxis: SCD  Consults: GI consult  Advance Care Planning:   Code Status: Limited: Do not attempt resuscitation (DNR) -DNR-LIMITED -Do Not Intubate/DNI    Family Communication: none  Disposition Plan: Back to previous home environment  Severity of Illness: The appropriate patient status for this patient is OBSERVATION. Observation status is judged to be reasonable and necessary in order to provide the required intensity of service to ensure the patient's safety. The patient's presenting symptoms, physical exam findings, and initial radiographic and laboratory data in the context of their medical condition is felt to place them at decreased risk for further clinical deterioration. Furthermore, it is anticipated that the patient will be medically stable for discharge from the hospital within 2 midnights of admission.   Author: Andris Baumann, MD 08/27/2023 1:50 AM  For on call review [email or web address].

## 2023-08-27 NOTE — Assessment & Plan Note (Signed)
Mild troponin elevation, suspecting demand ischemia Continue to trend

## 2023-08-27 NOTE — Assessment & Plan Note (Signed)
COPD, stable Symptomatic for cough and congestion.  Chest x-ray with small pleural effusions and atelectasis versus infiltrate Will continue ceftriaxone and azithromycin Symptom control with antitussives Continue home inhalers DuoNebs as needed

## 2023-08-27 NOTE — IPAL (Signed)
  Interdisciplinary Goals of Care Family Meeting   Date carried out: 08/27/2023  Location of the meeting: Bedside  Member's involved: Physician  Durable Power of Attorney or acting medical decision maker: patient    Discussion: We discussed goals of care for [employer] .   I have reviewed medical records including EPIC notes, labs and imaging, assessed the patient  to discuss major active diagnoses, plan of care, natural trajectory, prognosis, GOC, EOL wishes, disposition and options including Full code/DNI/DNR and the concept of comfort care if DNR is elected. Questions and concerns were addressed. They are  in agreement to continue current plan of care . Election for DNR/DNI status.  She states when the time comes she is ready and she has been ready since 1967/09/15 when her mother died.   Code status:   Code Status: Limited: Do not attempt resuscitation (DNR) -DNR-LIMITED -Do Not Intubate/DNI    Disposition: Continue current acute care  Time spent for the meeting: 31    Andris Baumann, MD  08/27/2023, 5:14 AM

## 2023-08-27 NOTE — Assessment & Plan Note (Signed)
Continue lisinopril, atenolol

## 2023-08-27 NOTE — Assessment & Plan Note (Addendum)
Uncertain etiology, stool guaiac negative and patient reports no blood in stool or black stool Continue transfusion of 2 units PRBCs Will get an anemia panel Trend H&H following transfusion Last colonoscopy on record was from 2013 GI consulted

## 2023-08-27 NOTE — Assessment & Plan Note (Signed)
Renal function at baseline per review

## 2023-08-28 ENCOUNTER — Inpatient Hospital Stay (HOSPITAL_COMMUNITY)
Admit: 2023-08-28 | Discharge: 2023-08-28 | Disposition: A | Discharge status: Home / Self Care | Payer: Medicare HMO | Attending: Obstetrics and Gynecology | Admitting: Obstetrics and Gynecology

## 2023-08-28 DIAGNOSIS — D649 Anemia, unspecified: Secondary | ICD-10-CM

## 2023-08-28 DIAGNOSIS — R0609 Other forms of dyspnea: Secondary | ICD-10-CM

## 2023-08-28 LAB — CBC WITH DIFFERENTIAL/PLATELET
Abs Immature Granulocytes: 0.02 10*3/uL (ref 0.00–0.07)
Basophils Absolute: 0 10*3/uL (ref 0.0–0.1)
Basophils Relative: 1 %
Eosinophils Absolute: 0 10*3/uL (ref 0.0–0.5)
Eosinophils Relative: 0 %
HCT: 28.2 % — ABNORMAL LOW (ref 36.0–46.0)
Hemoglobin: 8.8 g/dL — ABNORMAL LOW (ref 12.0–15.0)
Immature Granulocytes: 0 %
Lymphocytes Relative: 13 %
Lymphs Abs: 0.6 10*3/uL — ABNORMAL LOW (ref 0.7–4.0)
MCH: 23.7 pg — ABNORMAL LOW (ref 26.0–34.0)
MCHC: 31.2 g/dL (ref 30.0–36.0)
MCV: 75.8 fL — ABNORMAL LOW (ref 80.0–100.0)
Monocytes Absolute: 0.4 10*3/uL (ref 0.1–1.0)
Monocytes Relative: 8 %
Neutro Abs: 3.5 10*3/uL (ref 1.7–7.7)
Neutrophils Relative %: 78 %
Platelets: 147 10*3/uL — ABNORMAL LOW (ref 150–400)
RBC: 3.72 MIL/uL — ABNORMAL LOW (ref 3.87–5.11)
RDW: 19.3 % — ABNORMAL HIGH (ref 11.5–15.5)
WBC: 4.6 10*3/uL (ref 4.0–10.5)
nRBC: 0 % (ref 0.0–0.2)

## 2023-08-28 LAB — ECHOCARDIOGRAM COMPLETE
AR max vel: 1.19 cm2
AV Area VTI: 1.33 cm2
AV Area mean vel: 1.33 cm2
AV Mean grad: 11.3 mm[Hg]
AV Peak grad: 21.2 mm[Hg]
Ao pk vel: 2.3 m/s
Area-P 1/2: 3.77 cm2
MV VTI: 1.39 cm2
S' Lateral: 3.3 cm

## 2023-08-28 LAB — TYPE AND SCREEN
ABO/RH(D): A POS
Antibody Screen: NEGATIVE
Unit division: 0
Unit division: 0

## 2023-08-28 LAB — BPAM RBC
Blood Product Expiration Date: 202501012359
Blood Product Expiration Date: 202501012359
ISSUE DATE / TIME: 202412100219
ISSUE DATE / TIME: 202412100632
Unit Type and Rh: 6200
Unit Type and Rh: 6200

## 2023-08-28 LAB — BASIC METABOLIC PANEL
Anion gap: 12 (ref 5–15)
BUN: 23 mg/dL (ref 8–23)
CO2: 27 mmol/L (ref 22–32)
Calcium: 8.3 mg/dL — ABNORMAL LOW (ref 8.9–10.3)
Chloride: 96 mmol/L — ABNORMAL LOW (ref 98–111)
Creatinine, Ser: 1.35 mg/dL — ABNORMAL HIGH (ref 0.44–1.00)
GFR, Estimated: 39 mL/min — ABNORMAL LOW (ref >60)
Glucose, Bld: 117 mg/dL — ABNORMAL HIGH (ref 70–99)
Potassium: 3.1 mmol/L — ABNORMAL LOW (ref 3.5–5.1)
Sodium: 135 mmol/L (ref 135–145)

## 2023-08-28 LAB — GLUCOSE, CAPILLARY
Glucose-Capillary: 108 mg/dL — ABNORMAL HIGH (ref 70–99)
Glucose-Capillary: 116 mg/dL — ABNORMAL HIGH (ref 70–99)
Glucose-Capillary: 127 mg/dL — ABNORMAL HIGH (ref 70–99)
Glucose-Capillary: 152 mg/dL — ABNORMAL HIGH (ref 70–99)
Glucose-Capillary: 164 mg/dL — ABNORMAL HIGH (ref 70–99)
Glucose-Capillary: 98 mg/dL (ref 70–99)

## 2023-08-28 LAB — PROCALCITONIN: Procalcitonin: 0.16 ng/mL

## 2023-08-28 MED ORDER — OXYCODONE HCL 5 MG PO TABS: 5.0000 mg | ORAL_TABLET | ORAL | Status: DC | PRN

## 2023-08-28 MED ORDER — IRON SUCROSE 300 MG IVPB - SIMPLE MED
300.0000 mg | Freq: Once | Status: AC
  Administered 2023-08-28: 300 mg via INTRAVENOUS
  Filled 2023-08-28: qty 300

## 2023-08-28 MED ORDER — INSULIN ASPART 100 UNIT/ML IJ SOLN: 0.0000 [IU] | Freq: Every day | INTRAMUSCULAR | Status: DC

## 2023-08-28 MED ORDER — DICLOFENAC SODIUM 1 % EX GEL
4.0000 g | Freq: Four times a day (QID) | CUTANEOUS | Status: DC
  Administered 2023-08-28 – 2023-08-29 (×4): 4 g via TOPICAL
  Filled 2023-08-28: qty 100

## 2023-08-28 MED ORDER — FUROSEMIDE 10 MG/ML IJ SOLN
40.0000 mg | Freq: Every day | INTRAMUSCULAR | Status: DC
  Administered 2023-08-28 – 2023-08-29 (×2): 40 mg via INTRAVENOUS
  Filled 2023-08-28 (×2): qty 4

## 2023-08-28 MED ORDER — METHOCARBAMOL 500 MG PO TABS
500.0000 mg | ORAL_TABLET | Freq: Three times a day (TID) | ORAL | Status: DC
  Administered 2023-08-28 (×3): 500 mg via ORAL
  Filled 2023-08-28 (×4): qty 1

## 2023-08-28 MED ORDER — INSULIN ASPART 100 UNIT/ML IJ SOLN: 0.0000 [IU] | Freq: Three times a day (TID) | INTRAMUSCULAR | Status: DC

## 2023-08-28 MED ORDER — BENZONATATE 100 MG PO CAPS
200.0000 mg | ORAL_CAPSULE | Freq: Three times a day (TID) | ORAL | Status: DC
  Administered 2023-08-28 – 2023-08-29 (×3): 200 mg via ORAL
  Filled 2023-08-28 (×3): qty 2

## 2023-08-28 MED ORDER — PREDNISONE 20 MG PO TABS: 40.0000 mg | ORAL_TABLET | Freq: Every day | ORAL | Status: DC

## 2023-08-28 MED ORDER — POTASSIUM CHLORIDE CRYS ER 20 MEQ PO TBCR
40.0000 meq | EXTENDED_RELEASE_TABLET | ORAL | Status: AC
  Administered 2023-08-28 (×2): 40 meq via ORAL
  Filled 2023-08-28 (×2): qty 2

## 2023-08-28 MED ORDER — SENNOSIDES-DOCUSATE SODIUM 8.6-50 MG PO TABS
1.0000 | ORAL_TABLET | Freq: Two times a day (BID) | ORAL | Status: DC
  Administered 2023-08-29: 1 via ORAL
  Filled 2023-08-28 (×3): qty 1

## 2023-08-28 MED ORDER — POLYETHYLENE GLYCOL 3350 17 G PO PACK
17.0000 g | PACK | Freq: Every day | ORAL | Status: DC
  Filled 2023-08-28 (×2): qty 1

## 2023-08-28 MED ORDER — METOPROLOL TARTRATE 50 MG PO TABS
50.0000 mg | ORAL_TABLET | Freq: Two times a day (BID) | ORAL | Status: DC
  Administered 2023-08-29: 50 mg via ORAL
  Filled 2023-08-28: qty 1

## 2023-08-28 NOTE — Progress Notes (Addendum)
[facility]astroenterology Inpatient Progress Note  Subjective: Patient seen for daily progress for symptomatic anemia. Her Hgb returned 8 after 2 u PRBC which is an appropriate response. She did pass a large, hard stool this morning and saw small amount of fresh blood on the tissue- not in the stool or water. She has never seen blood in the stool before- but normally her stools are not as hard. She had a negative rectal exam in the ED and no hemorrhoids appreciated. She has no N/V or abdominal pain. Her main concern is an acute event of SOB last night- "It comes on me all of a sudden". She was placed on supplemental oxygen for a brief time and that helped a lot.   CT A/P yesterday with cirrhosis findings, large umbilical hernia, containing mesenteric fat and a segment of the mid transverse colon. There is diffuse mesenteric edema and a small amount of free fluid within the hernia sac, consistent with incarcerated hernia. No evidence of bowel obstruction or ischemia.  Objective: Vital signs in last 24 hours: Temp:  [97.6 F (36.4 C)-98.7 F (37.1 C)] 98.6 F (37 C) (12/11 0414) Pulse Rate:  [75-89] 75 (12/11 0414) Resp:  [16-20] 20 (12/11 0414) BP: (115-156)/(55-93) 115/55 (12/11 0414) SpO2:  [92 %-96 %] 95 % (12/11 0414) Blood pressure (!) 115/55, pulse 75, temperature 98.6 F (37 C), temperature source Oral, resp. rate 20, height 5\' 4"  (1.626 m), weight 81.6 kg, SpO2 95%.    Intake/Output from previous day: 12/10 0701 - 12/11 0700 In: 180 [P.O.:180] Out: -   Intake/Output this shift: No intake/output data recorded.   Gen: NAD. Appears comfortable. Siting at the bedside. She looks stronger today.   Chest: CTA, no wheezes.  CV: RR nl S1, S2. Positive systolic murmur.  Abd: soft, nt, nd. BS+, non tender very large umbilical hernia.   Ext: Positive bilat lower ext edema.  Neuro: Alert and oriented. Judgement appears normal. Nonfocal.   Lab Results: Results for orders  placed or performed during the hospital encounter of 08/27/23 (from the past 24 hour(s))  Hemoglobin and hematocrit, blood     Status: Abnormal   Collection Time: 08/27/23  9:20 AM  Result Value Ref Range   Hemoglobin 8.4 (L) 12.0 - 15.0 g/dL   HCT 78.2 (L) 95.6 - 21.3 %  CBG monitoring, ED     Status: None   Collection Time: 08/27/23 11:58 AM  Result Value Ref Range   Glucose-Capillary 93 70 - 99 mg/dL  Respiratory (~08 pathogens) panel by PCR     Status: None   Collection Time: 08/27/23  2:02 PM   Specimen: Nasopharyngeal Swab; Respiratory  Result Value Ref Range   Adenovirus NOT DETECTED NOT DETECTED   Coronavirus 229E NOT DETECTED NOT DETECTED   Coronavirus HKU1 NOT DETECTED NOT DETECTED   Coronavirus NL63 NOT DETECTED NOT DETECTED   Coronavirus OC43 NOT DETECTED NOT DETECTED   Metapneumovirus NOT DETECTED NOT DETECTED   Rhinovirus / Enterovirus NOT DETECTED NOT DETECTED   Influenza A NOT DETECTED NOT DETECTED   Influenza B NOT DETECTED NOT DETECTED   Parainfluenza Virus 1 NOT DETECTED NOT DETECTED   Parainfluenza Virus 2 NOT DETECTED NOT DETECTED   Parainfluenza Virus 3 NOT DETECTED NOT DETECTED   Parainfluenza Virus 4 NOT DETECTED NOT DETECTED   Respiratory Syncytial Virus NOT DETECTED NOT DETECTED   Bordetella pertussis NOT DETECTED NOT DETECTED   Bordetella Parapertussis NOT DETECTED NOT DETECTED   Chlamydophila pneumoniae NOT DETECTED NOT  DETECTED   Mycoplasma pneumoniae NOT DETECTED NOT DETECTED  CBG monitoring, ED     Status: Abnormal   Collection Time: 08/27/23  5:12 PM  Result Value Ref Range   Glucose-Capillary 171 (H) 70 - 99 mg/dL  Glucose, capillary     Status: Abnormal   Collection Time: 08/27/23  8:27 PM  Result Value Ref Range   Glucose-Capillary 116 (H) 70 - 99 mg/dL  Glucose, capillary     Status: Abnormal   Collection Time: 08/28/23 12:24 AM  Result Value Ref Range   Glucose-Capillary 127 (H) 70 - 99 mg/dL  Glucose, capillary     Status: None    Collection Time: 08/28/23  4:27 AM  Result Value Ref Range   Glucose-Capillary 98 70 - 99 mg/dL     Recent Labs    22/02/54 1705 08/27/23 0920  WBC 5.1  --   HGB 6.5* 8.4*  HCT 21.8* 26.5*  PLT 141*  --    BMET Recent Labs    08/26/23 1705  NA 133*  K 4.2  CL 96*  CO2 30  GLUCOSE 130*  BUN 24*  CREATININE 1.34*  CALCIUM 8.3*   LFT Recent Labs    08/27/23 0506  PROT 6.4*  ALBUMIN 3.0*  AST 31  ALT 19  ALKPHOS 77  BILITOT 1.3*  BILIDIR 0.2  IBILI 1.1*   PT/INR Recent Labs    08/27/23 0034  LABPROT 14.4  INR 1.1   Hepatitis Panel No results for input(s): "HEPBSAG", "HCVAB", "HEPAIGM", "HEPBIGM" in the last 72 hours. C-Diff No results for input(s): "CDIFFTOX" in the last 72 hours. No results for input(s): "CDIFFPCR" in the last 72 hours.   Studies/Results: CT ABDOMEN PELVIS W CONTRAST  Result Date: 08/27/2023 CLINICAL DATA:  Generalized weakness, symptomatic anemia EXAM: CT ABDOMEN AND PELVIS WITH CONTRAST TECHNIQUE: Multidetector CT imaging of the abdomen and pelvis was performed using the standard protocol following bolus administration of intravenous contrast. RADIATION DOSE REDUCTION: This exam was performed according to the departmental dose-optimization program which includes automated exposure control, adjustment of the mA and/or kV according to patient size and/or use of iterative reconstruction technique. CONTRAST:  OMNIPAQUE IOHEXOL 300 MG/ML  SOLN COMPARISON:  05/03/2015 FINDINGS: Lower chest: Small right pleural effusion. No acute airspace disease. Cardiomegaly without pericardial effusion. Hepatobiliary: Nodularity of the liver capsule consistent with cirrhosis. No focal parenchymal abnormality. Prior cholecystectomy. Pancreas: Unremarkable. No pancreatic ductal dilatation or surrounding inflammatory changes. Spleen: Borderline splenomegaly, measuring 10.0 x 12.1 x 5.9 cm, consistent with portal venous hypertension. No focal parenchymal  abnormalities. Adrenals/Urinary Tract: Adrenal glands are unremarkable. Kidneys are normal, without renal calculi, focal lesion, or hydronephrosis. Bladder is unremarkable. Stomach/Bowel: No bowel obstruction or ileus. Normal appendix right lower quadrant. No bowel wall thickening or inflammatory change. Small hiatal hernia. Vascular/Lymphatic: Aortic atherosclerosis. 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 No pathologic adenopathy. Reproductive: Status post hysterectomy. No adnexal masses. Other: There is a large umbilical hernia again identified, with the abdominal wall defect measuring 5.8 x 7.1 cm. There is herniation of mesenteric fat and a segment of the mid transverse colon, without evidence of bowel obstruction. There is diffuse edema throughout the herniated mesenteric fat, with a small amount of fluid within the hernia sac, concerning for incarcerated hernia. No free intraperitoneal gas. Musculoskeletal: No acute or destructive bony abnormalities. Reconstructed images demonstrate no additional findings. IMPRESSION: 1. Large umbilical hernia, containing mesenteric fat and a segment of the mid  transverse colon. There is diffuse mesenteric edema and a small amount of free fluid within the hernia sac, consistent with incarcerated hernia. No evidence of bowel obstruction or ischemia. 2. 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 3. Small right pleural effusion. 4.  Aortic Atherosclerosis (ICD10-I70.0). Electronically Signed   By: Sharlet Salina M.D.   On: 08/27/2023 19:07   DG Chest Portable 1 View  Result Date: 08/27/2023 CLINICAL DATA:  Cough, shortness of breath EXAM: PORTABLE CHEST 1 VIEW COMPARISON:  02/24/2022 FINDINGS: Heart and mediastinal contours within normal limits. Small right pleural effusion. Right basilar atelectasis or infiltrate. Left lung clear. No acute bony  abnormality. IMPRESSION: Small right pleural effusion with right basilar atelectasis or infiltrate. Electronically Signed   By: Charlett Nose M.D.   On: 08/27/2023 01:13    Scheduled Inpatient Medications:    amLODipine  10 mg Oral Daily   atenolol  25 mg Oral Daily   budesonide-formoterol  2 puff Inhalation BID   insulin aspart  0-15 Units Subcutaneous Q4H   lisinopril  20 mg Oral Daily   methylPREDNISolone (SOLU-MEDROL) injection  40 mg Intravenous Daily   pantoprazole (PROTONIX) IV  40 mg Intravenous Q12H    Continuous Inpatient Infusions:    azithromycin 500 mg (08/28/23 0318)   cefTRIAXone (ROCEPHIN)  IV 2 g (08/28/23 0218)    PRN Inpatient Medications:  acetaminophen **OR** acetaminophen, guaiFENesin, ipratropium, ondansetron **OR** ondansetron (ZOFRAN) IV    Assessment:  - Pt with DOE, edema on presentation and during hospitalization. Diuresed. Echo done yesterday- pending results - DOE/symptomatic anemia: Hgb improved appropriately with 2 u prbc with chronicity as evidenced by iron deficiency.  - She reports hard, painful stool with small BRBPR on tissue today- lower outlet source most likely. She has had no melena or hematochezia otherwise. DRE in ER normal.  - Cirrhosis per CT report.  - Indirect hyperbilirubinemia- this can be related to stress induced vs hemolytic anemia. Other transaminases wnl at this time) - Albumin low at 3.0 which can also play role in anemia - PLT low- newly in setting of sever anemia.   Plan:  - Recommend iv iron infusions during hospitalization -Endoscopy can be considered in future after 1 week of Trulicity washout as emergent indication is not present at this time.  - Continue bid ppi at this time   - Cirrhosis finding-this is not new per nodular liver noted on CT in 2016. She had hepatic steatosis per [place] in 2009 and CT scan in 2012. She has had thrombocytopenia intermittent and hypoalbuminemia per chart review consistent with cirrhosis. PT  is 14.4 with INR 1.1. 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 HIV (neg 2019)  -Given the significance of her hernia, a colonoscopy has been deemed higher risk for  adverse outcome/complication. Patient does not want it.  Recommend stool softeners and hemorrhoidal cream as needed for possible IH.     Monitor H&H.  Transfusion and resuscitation as per primary team Avoid frequent lab draws to prevent lab induced anemia Supportive care and antiemetics as per primary team Maintain two sites IV access Avoid nsaids Monitor for GIB  Rowan Blase, ANP @[facility] @, 7:50 AM

## 2023-08-28 NOTE — Progress Notes (Signed)
PROGRESS NOTE    Kendra Clarke  ZOX:[id number] DOB: 05-23-1941 DOA: 08/27/2023 PCP: Shane Crutch, PA    Brief Narrative:  82 y.o. female with medical history significant for COPD, diabetes, hypertension, diastolic CHF, , who presents to the ED with generalized weakness as well as cough and congestion.  She denies fever, chills, chest pain, lower extremity pain or swelling.  Denies lightheadedness, one-sided weakness numbness or tingling. ED course and data review: Low-grade temp of 99 with otherwise normal vitals Workup notable for the following: Hemoglobin 6.5 down from 12 a year ago with negative guaiac Normal WBC with negative respiratory viral panel Troponin 27 with BNP 718 Creatinine 1.34 which is at baseline Urinalysis sterile EKG, personally viewed and interpreted showing NSR at 81 with incomplete RBBB and no ischemic ST-T wave changes. Chest x-ray and showing small right pleural effusion with right basilar atelectasis or infiltrate   Patient started on Rocephin and azithromycin Started on 1 of 2 units PRBCs Hospitalist consulted for admission for symptomatic anemia and possible pneumonia    Assessment & Plan:   Principal Problem:   Symptomatic anemia Active Problems:   CAP (community acquired pneumonia)   Elevated troponin   Chronic diastolic CHF (congestive heart failure) (HCC)   Diabetes (HCC)   HTN (hypertension)   COPD (chronic obstructive pulmonary disease) (HCC)   Chronic kidney disease, stage 3a (HCC) * Symptomatic anemia Iron deficiency anemia Uncertain etiology, stool guaiac negative and patient reports no blood in stool or black stool.  Patient responded to 2 units PRBC transfusion.  She is noted to be iron deficient. Continue transfusion of 2 units PRBCs Plan: IV iron while admitted PPI twice daily No plans for endoluminal evaluation during admission given high risk Dr. Timothy Lasso planning for outpatient endoscopic evaluation with assistance of  anesthesia   CAP (community acquired pneumonia) COPD, stable Symptomatic for cough and congestion.   Chest x-ray with small pleural effusions and atelectasis versus infiltrate .  Plan: Rocephin and azithromycin Symptom control with antitussives Continue home inhalers DuoNebs as needed   Elevated troponin Mild troponin elevation, suspecting demand ischemia Continue to trend   Acute on chronic diastolic congestive heart failure Evidence fluid overload on exam Plan: Continue Lasix 40 mg IV daily Strict ins and outs, daily weights Continue PTA beta-blocker and ACE inhibitor   Chronic kidney disease, stage 3a (HCC) Renal function at baseline per review   HTN (hypertension) Continue lisinopril, metoprolol   Diabetes (HCC) Sliding scale insulin coverage    DVT prophylaxis: SCDs Code Status: DNR Family Communication: None today Disposition Plan: Status is: Inpatient Remains inpatient appropriate because: Multiple acute issues as above   Level of care: Telemetry Medical  Consultants:  GI, signed off  Procedures:  None  Antimicrobials: Rocephin Azithromycin   Subjective: Seen and examined.  Resting comfortably in bed.  Endorses right shoulder pain.  Endorses intermittent shortness of breath.  Objective: Vitals:   08/28/23 0414 08/28/23 0824 08/28/23 0859 08/28/23 1342  BP: (!) 115/55 (!) 145/68 (!) 143/66 (!) 113/47  Pulse: 75 85 84 78  Resp: 20  (!) 24 20  Temp: 98.6 F (37 C) 98.5 F (36.9 C) 98.3 F (36.8 C) 98.6 F (37 C)  TempSrc: Oral Oral Oral Oral  SpO2: 95% 100% 96% 95%  Weight:      Height:        Intake/Output Summary (Last 24 hours) at 08/28/2023 1527 Last data filed at 08/28/2023 1501 Gross per 24 hour  Intake 678.61 ml  Output --  Net 678.61 ml   Filed Weights   08/26/23 1703  Weight: 81.6 kg    Examination:  General exam: Appears calm and comfortable  Respiratory system: Bibasilar crackles.  Normal work of breathing.  Room  air Cardiovascular system: S1-S2, RRR, no murmurs, 2+ pitting edema BLE Gastrointestinal system: Soft,/ND, normal bowel sounds Central nervous system: Alert and oriented. No focal neurological deficits. Extremities: Symmetric 5 x 5 power. Skin: No rashes, lesions or ulcers Psychiatry: Judgement and insight appear normal. Mood & affect appropriate.     Data Reviewed: I have personally reviewed following labs and imaging studies  CBC: Recent Labs  Lab 08/26/23 1705 08/27/23 0920 08/28/23 0955  WBC 5.1  --  4.6  NEUTROABS  --   --  3.5  HGB 6.5* 8.4* 8.8*  HCT 21.8* 26.5* 28.2*  MCV 75.7*  --  75.8*  PLT 141*  --  147*   Basic Metabolic Panel: Recent Labs  Lab 08/26/23 1705 08/28/23 0955  NA 133* 135  K 4.2 3.1*  CL 96* 96*  CO2 30 27  GLUCOSE 130* 117*  BUN 24* 23  CREATININE 1.34* 1.35*  CALCIUM 8.3* 8.3*   GFR: Estimated Creatinine Clearance: 33.2 mL/min (A) (by C-G formula based on SCr of 1.35 mg/dL (H)). Liver Function Tests: Recent Labs  Lab 08/27/23 0506  AST 31  ALT 19  ALKPHOS 77  BILITOT 1.3*  PROT 6.4*  ALBUMIN 3.0*   No results for input(s): "LIPASE", "AMYLASE" in the last 168 hours. No results for input(s): "AMMONIA" in the last 168 hours. Coagulation Profile: Recent Labs  Lab 08/27/23 0034  INR 1.1   Cardiac Enzymes: No results for input(s): "CKTOTAL", "CKMB", "CKMBINDEX", "TROPONINI" in the last 168 hours. BNP (last 3 results) No results for input(s): "PROBNP" in the last 8760 hours. HbA1C: Recent Labs    08/27/23 0506  HGBA1C 5.5   CBG: Recent Labs  Lab 08/27/23 2027 08/28/23 0024 08/28/23 0427 08/28/23 0856 08/28/23 1241  GLUCAP 116* 127* 98 108* 164*   Lipid Profile: No results for input(s): "CHOL", "HDL", "LDLCALC", "TRIG", "CHOLHDL", "LDLDIRECT" in the last 72 hours. Thyroid Function Tests: No results for input(s): "TSH", "T4TOTAL", "FREET4", "T3FREE", "THYROIDAB" in the last 72 hours. Anemia Panel: Recent Labs     08/27/23 0506  VITAMINB12 315  FOLATE 12.9  FERRITIN 4*  TIBC 486*  IRON 72  RETICCTPCT 1.7   Sepsis Labs: Recent Labs  Lab 08/28/23 0955  PROCALCITON 0.16    Recent Results (from the past 240 hour(s))  Resp panel by RT-PCR (RSV, Flu A&B, Covid) Anterior Nasal Swab     Status: None   Collection Time: 08/27/23 12:38 AM   Specimen: Anterior Nasal Swab  Result Value Ref Range Status   SARS Coronavirus 2 by RT PCR NEGATIVE NEGATIVE Final    Comment: (NOTE) SARS-CoV-2 target nucleic acids are NOT DETECTED.  The SARS-CoV-2 RNA is generally detectable in upper respiratory specimens during the acute phase of infection. The lowest concentration of SARS-CoV-2 viral copies this assay can detect is 138 copies/mL. A negative result does not preclude SARS-Cov-2 infection and should not be used as the sole basis for treatment or other patient management decisions. A negative result may occur with  improper specimen collection/handling, submission of specimen other than nasopharyngeal swab, presence of viral mutation(s) within the areas targeted by this assay, and inadequate number of viral copies(<138 copies/mL). A negative result must be combined with clinical observations, patient history, and epidemiological information.  The expected result is Negative.  Fact Sheet for Patients:  BloggerCourse.com  Fact Sheet for Healthcare Providers:  SeriousBroker.it  This test is no t yet approved or cleared by the [place] FDA and  has been authorized for detection and/or diagnosis of SARS-CoV-2 by FDA under an Emergency Use Authorization (EUA). This EUA will remain  in effect (meaning this test can be used) for the duration of the COVID-19 declaration under Section 564(b)(1) of the Act, 21 U.S.C.section 360bbb-3(b)(1), unless the authorization is terminated  or revoked sooner.       Influenza A by PCR NEGATIVE NEGATIVE Final    Influenza B by PCR NEGATIVE NEGATIVE Final    Comment: (NOTE) The Xpert Xpress SARS-CoV-2/FLU/RSV plus assay is intended as an aid in the diagnosis of influenza from Nasopharyngeal swab specimens and should not be used as a sole basis for treatment. Nasal washings and aspirates are unacceptable for Xpert Xpress SARS-CoV-2/FLU/RSV testing.  Fact Sheet for Patients: BloggerCourse.com  Fact Sheet for Healthcare Providers: SeriousBroker.it  This test is not yet approved or cleared by the [place] FDA and has been authorized for detection and/or diagnosis of SARS-CoV-2 by FDA under an Emergency Use Authorization (EUA). This EUA will remain in effect (meaning this test can be used) for the duration of the COVID-19 declaration under Section 564(b)(1) of the Act, 21 U.S.C. section 360bbb-3(b)(1), unless the authorization is terminated or revoked.     Resp Syncytial Virus by PCR NEGATIVE NEGATIVE Final    Comment: (NOTE) Fact Sheet for Patients: BloggerCourse.com  Fact Sheet for Healthcare Providers: SeriousBroker.it  This test is not yet approved or cleared by the [place] FDA and has been authorized for detection and/or diagnosis of SARS-CoV-2 by FDA under an Emergency Use Authorization (EUA). This EUA will remain in effect (meaning this test can be used) for the duration of the COVID-19 declaration under Section 564(b)(1) of the Act, 21 U.S.C. section 360bbb-3(b)(1), unless the authorization is terminated or revoked.  Performed at [facility], [address] [place], [place] [address]   Respiratory (~20 pathogens) panel by PCR     Status: None   Collection Time: 08/27/23  2:02 PM   Specimen: Nasopharyngeal Swab; Respiratory  Result Value Ref Range Status   Adenovirus NOT DETECTED NOT DETECTED Final   Coronavirus 229E NOT DETECTED NOT DETECTED Final     Comment: (NOTE) The Coronavirus on the Respiratory Panel, DOES NOT test for the novel  Coronavirus (2019 nCoV)    Coronavirus HKU1 NOT DETECTED NOT DETECTED Final   Coronavirus NL63 NOT DETECTED NOT DETECTED Final   Coronavirus OC43 NOT DETECTED NOT DETECTED Final   Metapneumovirus NOT DETECTED NOT DETECTED Final   Rhinovirus / Enterovirus NOT DETECTED NOT DETECTED Final   Influenza A NOT DETECTED NOT DETECTED Final   Influenza B NOT DETECTED NOT DETECTED Final   Parainfluenza Virus 1 NOT DETECTED NOT DETECTED Final   Parainfluenza Virus 2 NOT DETECTED NOT DETECTED Final   Parainfluenza Virus 3 NOT DETECTED NOT DETECTED Final   Parainfluenza Virus 4 NOT DETECTED NOT DETECTED Final   Respiratory Syncytial Virus NOT DETECTED NOT DETECTED Final   Bordetella pertussis NOT DETECTED NOT DETECTED Final   Bordetella Parapertussis NOT DETECTED NOT DETECTED Final   Chlamydophila pneumoniae NOT DETECTED NOT DETECTED Final   Mycoplasma pneumoniae NOT DETECTED NOT DETECTED Final    Comment: Performed at [facility], [address] [place], [place] [address]  Radiology Studies: CT ABDOMEN PELVIS W CONTRAST  Result Date: 08/27/2023 CLINICAL DATA:  Generalized weakness, symptomatic anemia EXAM: CT ABDOMEN AND PELVIS WITH CONTRAST TECHNIQUE: Multidetector CT imaging of the abdomen and pelvis was performed using the standard protocol following bolus administration of intravenous contrast. RADIATION DOSE REDUCTION: This exam was performed according to the departmental dose-optimization program which includes automated exposure control, adjustment of the mA and/or kV according to patient size and/or use of iterative reconstruction technique. CONTRAST:  OMNIPAQUE IOHEXOL 300 MG/ML  SOLN COMPARISON:  05/03/2015 FINDINGS: Lower chest: Small right pleural effusion. No acute airspace disease. Cardiomegaly without pericardial effusion. Hepatobiliary: Nodularity of the liver capsule  consistent with cirrhosis. No focal parenchymal abnormality. Prior cholecystectomy. Pancreas: Unremarkable. No pancreatic ductal dilatation or surrounding inflammatory changes. Spleen: Borderline splenomegaly, measuring 10.0 x 12.1 x 5.9 cm, consistent with portal venous hypertension. No focal parenchymal abnormalities. Adrenals/Urinary Tract: Adrenal glands are unremarkable. Kidneys are normal, without renal calculi, focal lesion, or hydronephrosis. Bladder is unremarkable. Stomach/Bowel: No bowel obstruction or ileus. Normal appendix right lower quadrant. No bowel wall thickening or inflammatory change. Small hiatal hernia. Vascular/Lymphatic: Aortic atherosclerosis. 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 No pathologic adenopathy. Reproductive: Status post hysterectomy. No adnexal masses. Other: There is a large umbilical hernia again identified, with the abdominal wall defect measuring 5.8 x 7.1 cm. There is herniation of mesenteric fat and a segment of the mid transverse colon, without evidence of bowel obstruction. There is diffuse edema throughout the herniated mesenteric fat, with a small amount of fluid within the hernia sac, concerning for incarcerated hernia. No free intraperitoneal gas. Musculoskeletal: No acute or destructive bony abnormalities. Reconstructed images demonstrate no additional findings. IMPRESSION: 1. Large umbilical hernia, containing mesenteric fat and a segment of the mid transverse colon. There is diffuse mesenteric edema and a small amount of free fluid within the hernia sac, consistent with incarcerated hernia. No evidence of bowel obstruction or ischemia. 2. 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 3. Small right pleural effusion. 4.  Aortic Atherosclerosis (ICD10-I70.0). Electronically Signed   By: Sharlet Salina M.D.   On: 08/27/2023 19:07   DG Chest  Portable 1 View  Result Date: 08/27/2023 CLINICAL DATA:  Cough, shortness of breath EXAM: PORTABLE CHEST 1 VIEW COMPARISON:  02/24/2022 FINDINGS: Heart and mediastinal contours within normal limits. Small right pleural effusion. Right basilar atelectasis or infiltrate. Left lung clear. No acute bony abnormality. IMPRESSION: Small right pleural effusion with right basilar atelectasis or infiltrate. Electronically Signed   By: Charlett Nose M.D.   On: 08/27/2023 01:13        Scheduled Meds:  amLODipine  10 mg Oral Daily   benzonatate  200 mg Oral TID   budesonide-formoterol  2 puff Inhalation BID   diclofenac Sodium  4 g Topical QID   furosemide  40 mg Intravenous Daily   insulin aspart  0-15 Units Subcutaneous Q4H   lisinopril  20 mg Oral Daily   methocarbamol  500 mg Oral TID   [START ON 08/29/2023] metoprolol tartrate  50 mg Oral BID   pantoprazole (PROTONIX) IV  40 mg Intravenous Q12H   polyethylene glycol  17 g Oral Daily   senna-docusate  1 tablet Oral BID   Continuous Infusions:  azithromycin Stopped (08/28/23 0425)   cefTRIAXone (ROCEPHIN)  IV 2 g (08/28/23 0218)     LOS: 1 day      Tresa Moore,  MD [facility]   If 7PM-7AM, please contact night-coverage  08/28/2023, 3:27 PM

## 2023-08-28 NOTE — Plan of Care (Signed)

## 2023-08-29 DIAGNOSIS — D649 Anemia, unspecified: Secondary | ICD-10-CM | POA: Diagnosis not present

## 2023-08-29 LAB — GLUCOSE, CAPILLARY
Glucose-Capillary: 96 mg/dL (ref 70–99)
Glucose-Capillary: 98 mg/dL (ref 70–99)
Glucose-Capillary: 124 mg/dL — ABNORMAL HIGH (ref 70–99)

## 2023-08-29 LAB — LEGIONELLA PNEUMOPHILA SEROGP 1 UR AG: L. pneumophila Serogp 1 Ur Ag: NEGATIVE

## 2023-08-29 MED ORDER — PANTOPRAZOLE SODIUM 40 MG PO TBEC: 40.0000 mg | DELAYED_RELEASE_TABLET | Freq: Two times a day (BID) | ORAL | 1 refills | Status: AC

## 2023-08-29 MED ORDER — AMOXICILLIN-POT CLAVULANATE 875-125 MG PO TABS: 1.0000 | ORAL_TABLET | Freq: Two times a day (BID) | ORAL | 0 refills | Status: AC

## 2023-08-29 MED ORDER — BENZONATATE 200 MG PO CAPS: 200.0000 mg | ORAL_CAPSULE | Freq: Three times a day (TID) | ORAL | 0 refills | Status: AC | PRN

## 2023-08-29 NOTE — Discharge Summary (Signed)
Physician Discharge Summary  Kendra Clarke XLK:[id number] DOB: 11/30/1940 DOA: 08/27/2023  PCP: Shane Crutch, PA  Admit date: 08/27/2023 Discharge date: 08/29/2023  Admitted From: Home Disposition:  Home  Recommendations for Outpatient Follow-up:  Follow up with PCP in 1-2 weeks   Home Health:No  Equipment/Devices:None   Discharge Condition:Stable  CODE STATUS:DNR  Diet recommendation: Heart  Brief/Interim Summary:  82 y.o. female with medical history significant for COPD, diabetes, hypertension, diastolic CHF, , who presents to the ED with generalized weakness as well as cough and congestion.  She denies fever, chills, chest pain, lower extremity pain or swelling.  Denies lightheadedness, one-sided weakness numbness or tingling. ED course and data review: Low-grade temp of 99 with otherwise normal vitals Workup notable for the following: Hemoglobin 6.5 down from 12 a year ago with negative guaiac Normal WBC with negative respiratory viral panel Troponin 27 with BNP 718 Creatinine 1.34 which is at baseline Urinalysis sterile EKG, personally viewed and interpreted showing NSR at 81 with incomplete RBBB and no ischemic ST-T wave changes. Chest x-ray and showing small right pleural effusion with right basilar atelectasis or infiltrate   Patient started on Rocephin and azithromycin Started on 1 of 2 units PRBCs Hospitalist consulted for admission for symptomatic anemia and possible pneumonia     Discharge Diagnoses:  Principal Problem:   Symptomatic anemia Active Problems:   CAP (community acquired pneumonia)   Elevated troponin   Chronic diastolic CHF (congestive heart failure) (HCC)   Diabetes (HCC)   HTN (hypertension)   COPD (chronic obstructive pulmonary disease) (HCC)   Chronic kidney disease, stage 3a (HCC) * Symptomatic anemia Iron deficiency anemia Uncertain etiology, stool guaiac negative and patient reports no blood in stool or black stool.  Patient  responded to 2 units PRBC transfusion.  She is noted to be iron deficient. Continue transfusion of 2 units PRBCs Plan: IV iron while admitted PPI twice daily, prescribed on discharge No plans for endoluminal evaluation during admission given high risk Dr. Timothy Lasso planning for outpatient endoscopic evaluation with assistance of anesthesia   CAP (community acquired pneumonia) COPD, stable Symptomatic for cough and congestion.   Chest x-ray with small pleural effusions and atelectasis versus infiltrate .  Plan: Complete community-acquired pneumonia treatment As needed antitussives Resume home inhaler regimen     Acute on chronic diastolic congestive heart failure Evidence fluid overload on exam Plan: Diuresis intravenous Lasix in the hospital.  Resume home torsemide 40 mg daily on DC.  Outpatient referral to cardiology.  Patient states this will be arranged through her PCP.  Echocardiogram in house showed normal ejection fraction with grade 2 diastolic dysfunction.    Discharge Instructions  Discharge Instructions     Diet - low sodium heart healthy   Complete by: As directed    Increase activity slowly   Complete by: As directed       Allergies as of 08/29/2023       Reactions   Albuterol Anaphylaxis, Rash   Other reaction(s): Other (See Comments) Patient states it gives her an asthma attack Other reaction(s): Other (See Comments), Respiratory Distress Patient states it gives her an asthma attack Other reaction(s): Other (See Comments) Patient states it gives her an asthma attack   Codeine Anxiety, Shortness Of Breath   Tramadol Hives   Sulfa Antibiotics Rash        Medication List     PAUSE taking these medications    Trulicity 0.75 MG/0.5ML Soaj Wait to take this until your doctor  or other care provider tells you to start again. Generic drug: Dulaglutide Inject 0.75 mg into the skin once a week.       STOP taking these medications    methimazole 5 MG  tablet Commonly known as: TAPAZOLE   potassium chloride 10 MEQ tablet Commonly known as: KLOR-CON       TAKE these medications    amLODipine 10 MG tablet Commonly known as: NORVASC Take 10 mg by mouth daily.   amoxicillin-clavulanate 875-125 MG tablet Commonly known as: AUGMENTIN Take 1 tablet by mouth 2 (two) times daily for 3 days.   Atrovent HFA 17 MCG/ACT inhaler Generic drug: ipratropium Inhale into the lungs.   benzonatate 200 MG capsule Commonly known as: TESSALON Take 1 capsule (200 mg total) by mouth 3 (three) times daily as needed for cough.   ergocalciferol 1.25 MG (50000 UT) capsule Commonly known as: VITAMIN D2 Take 50,000 Units by mouth once a week.   gabapentin 100 MG capsule Commonly known as: NEURONTIN Take 100 mg by mouth 2 (two) times daily.   hydrochlorothiazide 12.5 MG capsule Commonly known as: MICROZIDE Take 12.5 mg by mouth daily.   lisinopril 20 MG tablet Commonly known as: ZESTRIL Take by mouth.   meloxicam 15 MG tablet Commonly known as: MOBIC Take 15 mg by mouth daily.   metoprolol tartrate 50 MG tablet Commonly known as: LOPRESSOR Take 50 mg by mouth 2 (two) times daily.   pantoprazole 40 MG tablet Commonly known as: Protonix Take 1 tablet (40 mg total) by mouth 2 (two) times daily before a meal.   potassium chloride 10 MEQ tablet Commonly known as: KLOR-CON M Take 10 mEq by mouth 2 (two) times daily.   Symbicort 160-4.5 MCG/ACT inhaler Generic drug: budesonide-formoterol INHALE 2 PUFFS TWICE DAILY FOR ASTHMA   torsemide 20 MG tablet Commonly known as: DEMADEX Take 40 mg by mouth daily.        Follow-up Information     Shane Crutch, [place] Follow up on 09/06/2023.   Specialty: Family Medicine Why: Go at 11:00am. Contact information: [address] [place] [address] [phone]         [name], DO Follow up.   Specialty: Gastroenterology Why: Dr. Ferd Hibbs office will contact you  regarding appointment details for your outpatient endoscopy Contact information: [address] [facility]astroenter[facility] [place] [address] [phone]                Allergies  Allergen Reactions   Albuterol Anaphylaxis and Rash    Other reaction(s): Other (See Comments) Patient states it gives her an asthma attack Other reaction(s): Other (See Comments), Respiratory Distress Patient states it gives her an asthma attack  Other reaction(s): Other (See Comments) Patient states it gives her an asthma attack    Codeine Anxiety and Shortness Of Breath   Tramadol Hives   Sulfa Antibiotics Rash    Consultations: None   Procedures/Studies: ECHOCARDIOGRAM COMPLETE Result Date: 08/28/2023    ECHOCARDIOGRAM REPORT   Patient Name:   TANAY SKAJA Date of Exam: 08/28/2023 Medical Rec #:  [id number]       Height:       64.0 in Accession #:    [id number]      Weight:       180.0 lb Date of Birth:  06/10/1941      BSA:          1.871 m Patient Age:    63 years  BP:           115/55 mmHg Patient Gender: F               HR:           75 bpm. Exam Location:  [facility] Procedure: 2D Echo, Cardiac Doppler and Color Doppler Indications:     Dyspnea R06.00  History:         Patient has no prior history of Echocardiogram examinations.                  COPD; Risk Factors:Hypertension and Diabetes.  Sonographer:     Cristela Blue Referring Phys:  [id number] NOAH BEDFORD WOUK Diagnosing Phys: Julien Nordmann MD IMPRESSIONS  1. Left ventricular ejection fraction, by estimation, is 55 to 60%. The left ventricle has normal function. The left ventricle has no regional wall motion abnormalities. Left ventricular diastolic parameters are consistent with Grade II diastolic dysfunction (pseudonormalization).  2. Right ventricular systolic function is normal. The right ventricular size is normal. There is normal pulmonary artery systolic pressure. The estimated right ventricular systolic pressure is 33.3 mmHg.  3. The  mitral valve is normal in structure. Moderate mitral valve regurgitation. Mild mitral stenosis. The mean mitral valve gradient is 7.0 mmHg.  4. The aortic valve is calcified. There is moderate calcification of the aortic valve. Aortic valve regurgitation is mild. Mild aortic valve stenosis. Aortic valve area, by VTI measures 1.33 cm. Aortic valve mean gradient measures 11.3 mmHg. Aortic valve Vmax measures 2.30 m/s.  5. The inferior vena cava is normal in size with greater than 50% respiratory variability, suggesting right atrial pressure of 3 mmHg. FINDINGS  Left Ventricle: Left ventricular ejection fraction, by estimation, is 55 to 60%. The left ventricle has normal function. The left ventricle has no regional wall motion abnormalities. The left ventricular internal cavity size was normal in size. There is  no left ventricular hypertrophy. Left ventricular diastolic parameters are consistent with Grade II diastolic dysfunction (pseudonormalization). Right Ventricle: The right ventricular size is normal. No increase in right ventricular wall thickness. Right ventricular systolic function is normal. There is normal pulmonary artery systolic pressure. The tricuspid regurgitant velocity is 2.66 m/s, and  with an assumed right atrial pressure of 5 mmHg, the estimated right ventricular systolic pressure is 33.3 mmHg. Left Atrium: Left atrial size was normal in size. Right Atrium: Right atrial size was normal in size. Pericardium: There is no evidence of pericardial effusion. Mitral Valve: The mitral valve is normal in structure. There is mild calcification of the mitral valve leaflet(s). Mild mitral annular calcification. Moderate mitral valve regurgitation. Mild mitral valve stenosis. MV peak gradient, 11.3 mmHg. The mean mitral valve gradient is 7.0 mmHg. Tricuspid Valve: The tricuspid valve is normal in structure. Tricuspid valve regurgitation is mild . No evidence of tricuspid stenosis. Aortic Valve: The aortic  valve is calcified. There is moderate calcification of the aortic valve. Aortic valve regurgitation is mild. Mild aortic stenosis is present. Aortic valve mean gradient measures 11.3 mmHg. Aortic valve peak gradient measures 21.2  mmHg. Aortic valve area, by VTI measures 1.33 cm. Pulmonic Valve: The pulmonic valve was normal in structure. Pulmonic valve regurgitation is not visualized. No evidence of pulmonic stenosis. Aorta: The aortic root is normal in size and structure. Venous: The inferior vena cava is normal in size with greater than 50% respiratory variability, suggesting right atrial pressure of 3 mmHg. IAS/Shunts: No atrial level shunt detected by color flow Doppler.  LEFT  VENTRICLE PLAX 2D LVIDd:         4.60 cm   Diastology LVIDs:         3.30 cm   LV e' medial:    7.72 cm/s LV PW:         1.10 cm   LV E/e' medial:  21.4 LV IVS:        1.00 cm   LV e' lateral:   9.03 cm/s LVOT diam:     2.00 cm   LV E/e' lateral: 18.3 LV SV:         64 LV SV Index:   34 LVOT Area:     3.14 cm  RIGHT VENTRICLE RV Basal diam:  4.20 cm RV Mid diam:    3.60 cm LEFT ATRIUM             Index        RIGHT ATRIUM           Index LA diam:        3.90 cm 2.08 cm/m   RA Area:     17.70 cm LA Vol (A2C):   67.8 ml 36.24 ml/m  RA Volume:   46.30 ml  24.75 ml/m LA Vol (A4C):   46.7 ml 24.96 ml/m LA Biplane Vol: 56.2 ml 30.04 ml/m  AORTIC VALVE AV Area (Vmax):    1.19 cm AV Area (Vmean):   1.33 cm AV Area (VTI):     1.33 cm AV Vmax:           230.00 cm/s AV Vmean:          156.333 cm/s AV VTI:            0.479 m AV Peak Grad:      21.2 mmHg AV Mean Grad:      11.3 mmHg LVOT Vmax:         87.00 cm/s LVOT Vmean:        66.000 cm/s LVOT VTI:          0.203 m LVOT/AV VTI ratio: 0.42  AORTA Ao Root diam: 2.70 cm MITRAL VALVE                TRICUSPID VALVE MV Area (PHT): 3.77 cm     TR Peak grad:   28.3 mmHg MV Area VTI:   1.39 cm     TR Vmax:        266.00 cm/s MV Peak grad:  11.3 mmHg MV Mean grad:  7.0 mmHg     SHUNTS MV Vmax:        1.68 m/s     Systemic VTI:  0.20 m MV Vmean:      126.0 cm/s   Systemic Diam: 2.00 cm MV Decel Time: 201 msec MV E velocity: 165.00 cm/s MV A velocity: 153.00 cm/s MV E/A ratio:  1.08 Julien Nordmann MD Electronically signed by Julien Nordmann MD Signature Date/Time: 08/28/2023/6:30:03 PM    Final    CT ABDOMEN PELVIS W CONTRAST Result Date: 08/27/2023 CLINICAL DATA:  Generalized weakness, symptomatic anemia EXAM: CT ABDOMEN AND PELVIS WITH CONTRAST TECHNIQUE: Multidetector CT imaging of the abdomen and pelvis was performed using the standard protocol following bolus administration of intravenous contrast. RADIATION DOSE REDUCTION: This exam was performed according to the departmental dose-optimization program which includes automated exposure control, adjustment of the mA and/or kV according to patient size and/or use of iterative reconstruction technique. CONTRAST:  OMNIPAQUE IOHEXOL 300 MG/ML  SOLN COMPARISON:  05/03/2015  FINDINGS: Lower chest: Small right pleural effusion. No acute airspace disease. Cardiomegaly without pericardial effusion. Hepatobiliary: Nodularity of the liver capsule consistent with cirrhosis. No focal parenchymal abnormality. Prior cholecystectomy. Pancreas: Unremarkable. No pancreatic ductal dilatation or surrounding inflammatory changes. Spleen: Borderline splenomegaly, measuring 10.0 x 12.1 x 5.9 cm, consistent with portal venous hypertension. No focal parenchymal abnormalities. Adrenals/Urinary Tract: Adrenal glands are unremarkable. Kidneys are normal, without renal calculi, focal lesion, or hydronephrosis. Bladder is unremarkable. Stomach/Bowel: No bowel obstruction or ileus. Normal appendix right lower quadrant. No bowel wall thickening or inflammatory change. Small hiatal hernia. Vascular/Lymphatic: Aortic atherosclerosis. 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 No pathologic adenopathy.  Reproductive: Status post hysterectomy. No adnexal masses. Other: There is a large umbilical hernia again identified, with the abdominal wall defect measuring 5.8 x 7.1 cm. There is herniation of mesenteric fat and a segment of the mid transverse colon, without evidence of bowel obstruction. There is diffuse edema throughout the herniated mesenteric fat, with a small amount of fluid within the hernia sac, concerning for incarcerated hernia. No free intraperitoneal gas. Musculoskeletal: No acute or destructive bony abnormalities. Reconstructed images demonstrate no additional findings. IMPRESSION: 1. Large umbilical hernia, containing mesenteric fat and a segment of the mid transverse colon. There is diffuse mesenteric edema and a small amount of free fluid within the hernia sac, consistent with incarcerated hernia. No evidence of bowel obstruction or ischemia. 2. 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 3. Small right pleural effusion. 4.  Aortic Atherosclerosis (ICD10-I70.0). Electronically Signed   By: Sharlet Salina M.D.   On: 08/27/2023 19:07   DG Chest Portable 1 View Result Date: 08/27/2023 CLINICAL DATA:  Cough, shortness of breath EXAM: PORTABLE CHEST 1 VIEW COMPARISON:  02/24/2022 FINDINGS: Heart and mediastinal contours within normal limits. Small right pleural effusion. Right basilar atelectasis or infiltrate. Left lung clear. No acute bony abnormality. IMPRESSION: Small right pleural effusion with right basilar atelectasis or infiltrate. Electronically Signed   By: Charlett Nose M.D.   On: 08/27/2023 01:13      Subjective: Seen and examined on the day of discharge.  Stable no distress.  Appropriate for discharge home.  Discharge Exam: Vitals:   08/29/23 0426 08/29/23 0849  BP: (!) 143/73 (!) 143/62  Pulse: 83 81  Resp: 18 (!) 24  Temp: 98.1 F (36.7 C) 98.1 F (36.7 C)  SpO2: 99% 98%   Vitals:   08/28/23 1603 08/29/23 0255  08/29/23 0426 08/29/23 0849  BP: (!) 119/55 (!) 132/52 (!) 143/73 (!) 143/62  Pulse: 75 80 83 81  Resp: (!) 24 20 18  (!) 24  Temp: 98.8 F (37.1 C) 98.7 F (37.1 C) 98.1 F (36.7 C) 98.1 F (36.7 C)  TempSrc: Oral  Oral Oral  SpO2: 96% 96% 99% 98%  Weight:      Height:        General: Pt is alert, awake, not in acute distress Cardiovascular: RRR, S1/S2 +, no rubs, no gallops Respiratory: CTA bilaterally, no wheezing, no rhonchi Abdominal: Soft, NT, ND, bowel sounds + Extremities: no edema, no cyanosis    The results of significant diagnostics from this hospitalization (including imaging, microbiology, ancillary and laboratory) are listed below for reference.     Microbiology: Recent Results (from the past 240 hours)  Resp panel by RT-PCR (RSV, Flu A&B, Covid) Anterior Nasal Swab     Status: None   Collection Time: 08/27/23 12:38 AM  Specimen: Anterior Nasal Swab  Result Value Ref Range Status   SARS Coronavirus 2 by RT PCR NEGATIVE NEGATIVE Final    Comment: (NOTE) SARS-CoV-2 target nucleic acids are NOT DETECTED.  The SARS-CoV-2 RNA is generally detectable in upper respiratory specimens during the acute phase of infection. The lowest concentration of SARS-CoV-2 viral copies this assay can detect is 138 copies/mL. A negative result does not preclude SARS-Cov-2 infection and should not be used as the sole basis for treatment or other patient management decisions. A negative result may occur with  improper specimen collection/handling, submission of specimen other than nasopharyngeal swab, presence of viral mutation(s) within the areas targeted by this assay, and inadequate number of viral copies(<138 copies/mL). A negative result must be combined with clinical observations, patient history, and epidemiological information. The expected result is Negative.  Fact Sheet for Patients:  BloggerCourse.com  Fact Sheet for Healthcare Providers:   SeriousBroker.it  This test is no t yet approved or cleared by the [place] FDA and  has been authorized for detection and/or diagnosis of SARS-CoV-2 by FDA under an Emergency Use Authorization (EUA). This EUA will remain  in effect (meaning this test can be used) for the duration of the COVID-19 declaration under Section 564(b)(1) of the Act, 21 U.S.C.section 360bbb-3(b)(1), unless the authorization is terminated  or revoked sooner.       Influenza A by PCR NEGATIVE NEGATIVE Final   Influenza B by PCR NEGATIVE NEGATIVE Final    Comment: (NOTE) The Xpert Xpress SARS-CoV-2/FLU/RSV plus assay is intended as an aid in the diagnosis of influenza from Nasopharyngeal swab specimens and should not be used as a sole basis for treatment. Nasal washings and aspirates are unacceptable for Xpert Xpress SARS-CoV-2/FLU/RSV testing.  Fact Sheet for Patients: BloggerCourse.com  Fact Sheet for Healthcare Providers: SeriousBroker.it  This test is not yet approved or cleared by the [place] FDA and has been authorized for detection and/or diagnosis of SARS-CoV-2 by FDA under an Emergency Use Authorization (EUA). This EUA will remain in effect (meaning this test can be used) for the duration of the COVID-19 declaration under Section 564(b)(1) of the Act, 21 U.S.C. section 360bbb-3(b)(1), unless the authorization is terminated or revoked.     Resp Syncytial Virus by PCR NEGATIVE NEGATIVE Final    Comment: (NOTE) Fact Sheet for Patients: BloggerCourse.com  Fact Sheet for Healthcare Providers: SeriousBroker.it  This test is not yet approved or cleared by the [place] FDA and has been authorized for detection and/or diagnosis of SARS-CoV-2 by FDA under an Emergency Use Authorization (EUA). This EUA will remain in effect (meaning this test can be used) for  the duration of the COVID-19 declaration under Section 564(b)(1) of the Act, 21 U.S.C. section 360bbb-3(b)(1), unless the authorization is terminated or revoked.  Performed at [facility], [address] [place], [place] [address]   Respiratory (~20 pathogens) panel by PCR     Status: None   Collection Time: 08/27/23  2:02 PM   Specimen: Nasopharyngeal Swab; Respiratory  Result Value Ref Range Status   Adenovirus NOT DETECTED NOT DETECTED Final   Coronavirus 229E NOT DETECTED NOT DETECTED Final    Comment: (NOTE) The Coronavirus on the Respiratory Panel, DOES NOT test for the novel  Coronavirus (2019 nCoV)    Coronavirus HKU1 NOT DETECTED NOT DETECTED Final   Coronavirus NL63 NOT DETECTED NOT DETECTED Final   Coronavirus OC43 NOT DETECTED NOT DETECTED Final   Metapneumovirus NOT DETECTED NOT DETECTED Final  Rhinovirus / Enterovirus NOT DETECTED NOT DETECTED Final   Influenza A NOT DETECTED NOT DETECTED Final   Influenza B NOT DETECTED NOT DETECTED Final   Parainfluenza Virus 1 NOT DETECTED NOT DETECTED Final   Parainfluenza Virus 2 NOT DETECTED NOT DETECTED Final   Parainfluenza Virus 3 NOT DETECTED NOT DETECTED Final   Parainfluenza Virus 4 NOT DETECTED NOT DETECTED Final   Respiratory Syncytial Virus NOT DETECTED NOT DETECTED Final   Bordetella pertussis NOT DETECTED NOT DETECTED Final   Bordetella Parapertussis NOT DETECTED NOT DETECTED Final   Chlamydophila pneumoniae NOT DETECTED NOT DETECTED Final   Mycoplasma pneumoniae NOT DETECTED NOT DETECTED Final    Comment: Performed at [facility], [address] [place], [place] [address]     Labs: BNP (last 3 results) Recent Labs    08/26/23 1705  BNP 718.0*   Basic Metabolic Panel: Recent Labs  Lab 08/26/23 1705 08/28/23 0955  NA 133* 135  K 4.2 3.1*  CL 96* 96*  CO2 30 27  GLUCOSE 130* 117*  BUN 24* 23  CREATININE 1.34* 1.35*  CALCIUM 8.3* 8.3*   Liver Function Tests: Recent Labs  Lab  08/27/23 0506  AST 31  ALT 19  ALKPHOS 77  BILITOT 1.3*  PROT 6.4*  ALBUMIN 3.0*   No results for input(s): "LIPASE", "AMYLASE" in the last 168 hours. No results for input(s): "AMMONIA" in the last 168 hours. CBC: Recent Labs  Lab 08/26/23 1705 08/27/23 0920 08/28/23 0955  WBC 5.1  --  4.6  NEUTROABS  --   --  3.5  HGB 6.5* 8.4* 8.8*  HCT 21.8* 26.5* 28.2*  MCV 75.7*  --  75.8*  PLT 141*  --  147*   Cardiac Enzymes: No results for input(s): "CKTOTAL", "CKMB", "CKMBINDEX", "TROPONINI" in the last 168 hours. BNP: Invalid input(s): "POCBNP" CBG: Recent Labs  Lab 08/28/23 1605 08/28/23 2053 08/29/23 0407 08/29/23 0852 08/29/23 1149  GLUCAP 152* 116* 96 98 124*   D-Dimer No results for input(s): "DDIMER" in the last 72 hours. Hgb A1c Recent Labs    08/27/23 0506  HGBA1C 5.5   Lipid Profile No results for input(s): "CHOL", "HDL", "LDLCALC", "TRIG", "CHOLHDL", "LDLDIRECT" in the last 72 hours. Thyroid function studies No results for input(s): "TSH", "T4TOTAL", "T3FREE", "THYROIDAB" in the last 72 hours.  Invalid input(s): "FREET3" Anemia work up Recent Labs    08/27/23 0506  VITAMINB12 315  FOLATE 12.9  FERRITIN 4*  TIBC 486*  IRON 72  RETICCTPCT 1.7   Urinalysis    Component Value Date/Time   COLORURINE YELLOW (A) 08/26/2023 2202   APPEARANCEUR CLEAR (A) 08/26/2023 2202   LABSPEC 1.012 08/26/2023 2202   PHURINE 7.0 08/26/2023 2202   GLUCOSEU NEGATIVE 08/26/2023 2202   HGBUR NEGATIVE 08/26/2023 2202   BILIRUBINUR NEGATIVE 08/26/2023 2202   KETONESUR NEGATIVE 08/26/2023 2202   PROTEINUR NEGATIVE 08/26/2023 2202   NITRITE NEGATIVE 08/26/2023 2202   LEUKOCYTESUR NEGATIVE 08/26/2023 2202   Sepsis Labs Recent Labs  Lab 08/26/23 1705 08/28/23 0955  WBC 5.1 4.6   Microbiology Recent Results (from the past 240 hours)  Resp panel by RT-PCR (RSV, Flu A&B, Covid) Anterior Nasal Swab     Status: None   Collection Time: 08/27/23 12:38 AM    Specimen: Anterior Nasal Swab  Result Value Ref Range Status   SARS Coronavirus 2 by RT PCR NEGATIVE NEGATIVE Final    Comment: (NOTE) SARS-CoV-2 target nucleic acids are NOT DETECTED.  The SARS-CoV-2 RNA  is generally detectable in upper respiratory specimens during the acute phase of infection. The lowest concentration of SARS-CoV-2 viral copies this assay can detect is 138 copies/mL. A negative result does not preclude SARS-Cov-2 infection and should not be used as the sole basis for treatment or other patient management decisions. A negative result may occur with  improper specimen collection/handling, submission of specimen other than nasopharyngeal swab, presence of viral mutation(s) within the areas targeted by this assay, and inadequate number of viral copies(<138 copies/mL). A negative result must be combined with clinical observations, patient history, and epidemiological information. The expected result is Negative.  Fact Sheet for Patients:  BloggerCourse.com  Fact Sheet for Healthcare Providers:  SeriousBroker.it  This test is no t yet approved or cleared by the [place] FDA and  has been authorized for detection and/or diagnosis of SARS-CoV-2 by FDA under an Emergency Use Authorization (EUA). This EUA will remain  in effect (meaning this test can be used) for the duration of the COVID-19 declaration under Section 564(b)(1) of the Act, 21 U.S.C.section 360bbb-3(b)(1), unless the authorization is terminated  or revoked sooner.       Influenza A by PCR NEGATIVE NEGATIVE Final   Influenza B by PCR NEGATIVE NEGATIVE Final    Comment: (NOTE) The Xpert Xpress SARS-CoV-2/FLU/RSV plus assay is intended as an aid in the diagnosis of influenza from Nasopharyngeal swab specimens and should not be used as a sole basis for treatment. Nasal washings and aspirates are unacceptable for Xpert Xpress  SARS-CoV-2/FLU/RSV testing.  Fact Sheet for Patients: BloggerCourse.com  Fact Sheet for Healthcare Providers: SeriousBroker.it  This test is not yet approved or cleared by the [place] FDA and has been authorized for detection and/or diagnosis of SARS-CoV-2 by FDA under an Emergency Use Authorization (EUA). This EUA will remain in effect (meaning this test can be used) for the duration of the COVID-19 declaration under Section 564(b)(1) of the Act, 21 U.S.C. section 360bbb-3(b)(1), unless the authorization is terminated or revoked.     Resp Syncytial Virus by PCR NEGATIVE NEGATIVE Final    Comment: (NOTE) Fact Sheet for Patients: BloggerCourse.com  Fact Sheet for Healthcare Providers: SeriousBroker.it  This test is not yet approved or cleared by the [place] FDA and has been authorized for detection and/or diagnosis of SARS-CoV-2 by FDA under an Emergency Use Authorization (EUA). This EUA will remain in effect (meaning this test can be used) for the duration of the COVID-19 declaration under Section 564(b)(1) of the Act, 21 U.S.C. section 360bbb-3(b)(1), unless the authorization is terminated or revoked.  Performed at [facility], [address] [place], [place] [address]   Respiratory (~20 pathogens) panel by PCR     Status: None   Collection Time: 08/27/23  2:02 PM   Specimen: Nasopharyngeal Swab; Respiratory  Result Value Ref Range Status   Adenovirus NOT DETECTED NOT DETECTED Final   Coronavirus 229E NOT DETECTED NOT DETECTED Final    Comment: (NOTE) The Coronavirus on the Respiratory Panel, DOES NOT test for the novel  Coronavirus (2019 nCoV)    Coronavirus HKU1 NOT DETECTED NOT DETECTED Final   Coronavirus NL63 NOT DETECTED NOT DETECTED Final   Coronavirus OC43 NOT DETECTED NOT DETECTED Final   Metapneumovirus NOT DETECTED NOT DETECTED Final    Rhinovirus / Enterovirus NOT DETECTED NOT DETECTED Final   Influenza A NOT DETECTED NOT DETECTED Final   Influenza B NOT DETECTED NOT DETECTED Final   Parainfluenza Virus 1 NOT DETECTED NOT DETECTED Final  Parainfluenza Virus 2 NOT DETECTED NOT DETECTED Final   Parainfluenza Virus 3 NOT DETECTED NOT DETECTED Final   Parainfluenza Virus 4 NOT DETECTED NOT DETECTED Final   Respiratory Syncytial Virus NOT DETECTED NOT DETECTED Final   Bordetella pertussis NOT DETECTED NOT DETECTED Final   Bordetella Parapertussis NOT DETECTED NOT DETECTED Final   Chlamydophila pneumoniae NOT DETECTED NOT DETECTED Final   Mycoplasma pneumoniae NOT DETECTED NOT DETECTED Final    Comment: Performed at [facility], [address], [place], [place] [address]     Time coordinating discharge: Over 30 minutes  SIGNED:   Tresa Moore, MD  [facility] 08/29/2023, 12:50 PM Pager   If 7PM-7AM, please contact night-coverage

## 2023-08-29 NOTE — Progress Notes (Signed)
Discharge education completed. Patient's daughter assisted her to get dressed. IV removed without complications. Volunteers wheeling patient to the medical mall exit, to be discharged to care of daughter in stable condition.  Cornell Barman Brentin Shin

## 2023-08-29 NOTE — Plan of Care (Signed)

## 2023-09-03 ENCOUNTER — Encounter
Admission: RE | Disposition: A | Discharge status: Home / Self Care | Payer: Self-pay | Source: Home / Self Care | Attending: Gastroenterology

## 2023-09-03 ENCOUNTER — Ambulatory Visit: Payer: Medicare HMO | Admitting: Certified Registered"

## 2023-09-03 ENCOUNTER — Encounter: Payer: Self-pay | Admitting: Gastroenterology

## 2023-09-03 ENCOUNTER — Ambulatory Visit
Admission: RE | Admit: 2023-09-03 | Discharge: 2023-09-03 | Disposition: A | Discharge status: Home / Self Care | Payer: Medicare HMO | Attending: Gastroenterology | Admitting: Gastroenterology

## 2023-09-03 DIAGNOSIS — I8510 Secondary esophageal varices without bleeding: Secondary | ICD-10-CM | POA: Insufficient documentation

## 2023-09-03 DIAGNOSIS — K229 Disease of esophagus, unspecified: Secondary | ICD-10-CM | POA: Diagnosis not present

## 2023-09-03 DIAGNOSIS — E119 Type 2 diabetes mellitus without complications: Secondary | ICD-10-CM | POA: Diagnosis not present

## 2023-09-03 DIAGNOSIS — K3189 Other diseases of stomach and duodenum: Secondary | ICD-10-CM | POA: Diagnosis not present

## 2023-09-03 DIAGNOSIS — J449 Chronic obstructive pulmonary disease, unspecified: Secondary | ICD-10-CM | POA: Diagnosis not present

## 2023-09-03 DIAGNOSIS — I10 Essential (primary) hypertension: Secondary | ICD-10-CM | POA: Insufficient documentation

## 2023-09-03 DIAGNOSIS — K766 Portal hypertension: Secondary | ICD-10-CM | POA: Insufficient documentation

## 2023-09-03 DIAGNOSIS — D500 Iron deficiency anemia secondary to blood loss (chronic): Secondary | ICD-10-CM | POA: Insufficient documentation

## 2023-09-03 HISTORY — PX: ESOPHAGOGASTRODUODENOSCOPY: SHX5428

## 2023-09-03 LAB — GLUCOSE, CAPILLARY: Glucose-Capillary: 111 mg/dL — ABNORMAL HIGH (ref 70–99)

## 2023-09-03 SURGERY — EGD (ESOPHAGOGASTRODUODENOSCOPY)
Anesthesia: General

## 2023-09-03 MED ORDER — LIDOCAINE HCL (CARDIAC) PF 100 MG/5ML IV SOSY
PREFILLED_SYRINGE | INTRAVENOUS | Status: DC | PRN
  Administered 2023-09-03: 100 mg via INTRAVENOUS

## 2023-09-03 MED ORDER — SODIUM CHLORIDE 0.9 % IV SOLN
INTRAVENOUS | Status: DC
Start: 2023-09-03 — End: 2023-09-03
  Administered 2023-09-03: 1000 mL via INTRAVENOUS

## 2023-09-03 MED ORDER — PROPOFOL 500 MG/50ML IV EMUL
INTRAVENOUS | Status: DC | PRN
  Administered 2023-09-03: 125 ug/kg/min via INTRAVENOUS

## 2023-09-03 MED ORDER — PROPOFOL 10 MG/ML IV BOLUS
INTRAVENOUS | Status: DC | PRN
  Administered 2023-09-03: 40 mg via INTRAVENOUS
  Administered 2023-09-03: 10 mg via INTRAVENOUS

## 2023-09-03 NOTE — Interval H&P Note (Signed)
Pt c/o chest pain prior to procedure. Improved without intervention. Anesthesia reviewed new ECG and no change from previous. Plan to proceed with upper endoscopy. Enis Slipper, DO [facility]

## 2023-09-03 NOTE — Transfer of Care (Signed)
Immediate Anesthesia Transfer of Care Note  Patient: Kendra Clarke  Procedure(s) Performed: ESOPHAGOGASTRODUODENOSCOPY (EGD)  Patient Location: Endoscopy Unit  Anesthesia Type:General  Level of Consciousness: awake, drowsy, and patient cooperative  Airway & Oxygen Therapy: Patient Spontanous Breathing and Patient connected to face mask oxygen  Post-op Assessment: Report given to RN and Post -op Vital signs reviewed and stable  Post vital signs: Reviewed and stable  Last Vitals:  Vitals Value Taken Time  BP 142/64 09/03/23 1349  Temp 36.4 C 09/03/23 1349  Pulse 80 09/03/23 1351  Resp 21 09/03/23 1351  SpO2 100 % 09/03/23 1351  Vitals shown include unfiled device data.  Last Pain:  Vitals:   09/03/23 1349  TempSrc: Temporal  PainSc: Asleep         Complications: No notable events documented.

## 2023-09-03 NOTE — Anesthesia Procedure Notes (Signed)
Procedure Name: General with mask airway Date/Time: 09/03/2023 1:38 PM  Performed by: Mohammed Kindle, CRNAPre-anesthesia Checklist: Patient identified, Emergency Drugs available, Suction available and Patient being monitored Patient Re-evaluated:Patient Re-evaluated prior to induction Oxygen Delivery Method: Simple face mask Induction Type: IV induction Placement Confirmation: positive ETCO2, CO2 detector and breath sounds checked- equal and bilateral Dental Injury: Teeth and Oropharynx as per pre-operative assessment

## 2023-09-03 NOTE — H&P (Signed)
Pre-Procedure H&P   [name] is a 82 y.o. female.  Gastroenterology Provider: [name], DO  PCP: Shane Crutch, [place]  Date: 09/03/2023  HPI Kendra Clarke is a 82 y.o. female who presents today for Esophagogastroduodenoscopy for symptomatic anemia; cirrhosis .  PT seen in the hospital on 12/10 for symptomatic anemia. She had no signs of gib including a negative FOBT in the ED with brown stool.   Hgb 6.5 from 12 (1 year ago). Received 2 u prbc and responded appropriately.  She had taken her trulicity (Monday 12/9) and was dealing with respiratory illness, so after discussion with patient and her son, egd was deferred to this week to help with sedation safety. Colonoscopy likely not feasible due to significance of hernia. She has also vocalized she would not pursue further intervention should a colon malignancy be found.  Has been on bid ppi since discharge  Noted to have cirrhotic findings on CT imaging.  No GI complaints  Last Colonoscopy: [facility] approx 2012 - 16mm SSP, multiple TVA, TA, SSP Last Endoscopy: 2012- [facility] negative gastritis  Echo 08/28/23- EF 55-60%; g2 diastolic dysfunction. Mild AS  Past Medical History:  Diagnosis Date   Arthritis    Asthma    COPD (chronic obstructive pulmonary disease) (HCC)    Diabetes mellitus without complication (HCC)    Hypertension    Liver disease    Umbilical hernia     Past Surgical History:  Procedure Laterality Date   ABDOMINAL HYSTERECTOMY     BLADDER SURGERY     CHOLECYSTECTOMY     nose surgery      Family History No h/o GI disease or malignancy  Review of Systems  Constitutional:  Negative for activity change, appetite change, chills, diaphoresis, fatigue, fever and unexpected weight change.  HENT:  Negative for trouble swallowing and voice change.   Respiratory:  Negative for shortness of breath and wheezing.   Cardiovascular:  Positive for leg swelling. Negative for chest pain  and palpitations.  Gastrointestinal:  Negative for abdominal distention, abdominal pain, anal bleeding, blood in stool, constipation, diarrhea, nausea, rectal pain and vomiting.  Musculoskeletal:  Positive for back pain. Negative for arthralgias and myalgias.  Skin:  Negative for color change and pallor.  Neurological:  Negative for dizziness, syncope and weakness.  Psychiatric/Behavioral:  Negative for confusion.   All other systems reviewed and are negative.    Medications No current facility-administered medications on file prior to encounter.   Current Outpatient Medications on File Prior to Encounter  Medication Sig Dispense Refill   amLODipine (NORVASC) 10 MG tablet Take 10 mg by mouth daily.     ATROVENT HFA 17 MCG/ACT inhaler Inhale into the lungs.     benzonatate (TESSALON) 200 MG capsule Take 1 capsule (200 mg total) by mouth 3 (three) times daily as needed for cough. 20 capsule 0   ergocalciferol (VITAMIN D2) 1.25 MG (50000 UT) capsule Take 50,000 Units by mouth once a week.     gabapentin (NEURONTIN) 100 MG capsule Take 100 mg by mouth 2 (two) times daily.     hydrochlorothiazide (MICROZIDE) 12.5 MG capsule Take 12.5 mg by mouth daily.     lisinopril (ZESTRIL) 20 MG tablet Take by mouth.     meloxicam (MOBIC) 15 MG tablet Take 15 mg by mouth daily.     metoprolol tartrate (LOPRESSOR) 50 MG tablet Take 50 mg by mouth 2 (two) times daily.     pantoprazole (PROTONIX)  40 MG tablet Take 1 tablet (40 mg total) by mouth 2 (two) times daily before a meal. 30 tablet 1   SYMBICORT 160-4.5 MCG/ACT inhaler INHALE 2 PUFFS TWICE DAILY FOR ASTHMA     torsemide (DEMADEX) 20 MG tablet Take 40 mg by mouth daily.     [Paused] Dulaglutide (TRULICITY) 0.75 MG/0.5ML SOPN Inject 0.75 mg into the skin once a week.     potassium chloride (KLOR-CON M) 10 MEQ tablet Take 10 mEq by mouth 2 (two) times daily.      Pertinent medications related to GI and procedure were reviewed by me with the patient  prior to the procedure   Current Facility-Administered Medications:    0.9 %  sodium chloride infusion, , Intravenous, Continuous, [name], DO  sodium chloride         Allergies  Allergen Reactions   Albuterol Anaphylaxis and Rash    Other reaction(s): Other (See Comments) Patient states it gives her an asthma attack Other reaction(s): Other (See Comments), Respiratory Distress Patient states it gives her an asthma attack  Other reaction(s): Other (See Comments) Patient states it gives her an asthma attack    Codeine Anxiety and Shortness Of Breath   Tramadol Hives   Sulfa Antibiotics Rash   Allergies were reviewed by me prior to the procedure  Objective   Body mass index is 30.21 kg/m. Vitals:   09/03/23 1301  BP: (!) 185/75  Pulse: 99  Resp: 19  Temp: (!) 97.5 F (36.4 C)  TempSrc: Temporal  SpO2: 95%  Weight: 79.8 kg  Height: 5\' 4"  (1.626 m)     Physical Exam Vitals and nursing note reviewed.  Constitutional:      General: She is not in acute distress.    Appearance: She is not ill-appearing, toxic-appearing or diaphoretic.  HENT:     Head: Normocephalic and atraumatic.     Nose: Nose normal.     Mouth/Throat:     Mouth: Mucous membranes are moist.     Pharynx: Oropharynx is clear.  Eyes:     General: No scleral icterus.    Extraocular Movements: Extraocular movements intact.  Cardiovascular:     Rate and Rhythm: Normal rate and regular rhythm.     Heart sounds: Murmur heard.     No friction rub. No gallop.  Pulmonary:     Effort: Pulmonary effort is normal. No respiratory distress.     Breath sounds: Normal breath sounds. No wheezing, rhonchi or rales.  Abdominal:     General: Bowel sounds are normal. There is no distension.     Palpations: Abdomen is soft.     Tenderness: There is no abdominal tenderness. There is no guarding or rebound.     Comments: Significant abdominal hernia. Soft, but non reducible. Non tender.   Musculoskeletal:     Cervical back: Neck supple.     Right lower leg: Edema present.     Left lower leg: Edema present.  Skin:    General: Skin is warm and dry.     Coloration: Skin is not jaundiced or pale.  Neurological:     General: No focal deficit present.     Mental Status: She is alert and oriented to person, place, and time. Mental status is at baseline.  Psychiatric:        Mood and Affect: Mood normal.        Behavior: Behavior normal.        Thought Content: Thought content normal.  Judgment: Judgment normal.      Assessment:  Ms. Kendra Clarke is a 82 y.o. female  who presents today for Esophagogastroduodenoscopy for symptomatic anemia; cirrhosis .  Plan:  Esophagogastroduodenoscopy with possible intervention today  Esophagogastroduodenoscopy with possible biopsy, control of bleeding, polypectomy, and interventions as necessary has been discussed with the patient/patient representative. Informed consent was obtained from the patient/patient representative after explaining the indication, nature, and risks of the procedure including but not limited to death, bleeding, perforation, missed neoplasm/lesions, cardiorespiratory compromise, and reaction to medications. Opportunity for questions was given and appropriate answers were provided. Patient/patient representative has verbalized understanding is amenable to undergoing the procedure.   [name], DO  [facility]  Portions of the record may have been created with voice recognition software. Occasional wrong-word or 'sound-a-like' substitutions may have occurred due to the inherent limitations of voice recognition software.  Read the chart carefully and recognize, using context, where substitutions may have occurred.

## 2023-09-03 NOTE — Op Note (Signed)
[facility] Gastroenterology Patient Name: Kallen Basic Procedure Date: 09/03/2023 1:11 PM MRN: [id number] Account #: [id number] Date of Birth: 1941-04-01 Admit Type: Outpatient Age: 82 Room: [facility] ENDO ROOM 1 Gender: Female Note Status: Finalized Instrument Name: Laurette Schimke [id number] Procedure:             Upper GI endoscopy Indications:           Iron deficiency anemia secondary to chronic blood loss Providers:             [name] DO, DO Medicines:             Monitored Anesthesia Care Complications:         No immediate complications. Estimated blood loss: None. Procedure:             Pre-Anesthesia Assessment:                        - Prior to the procedure, a History and Physical was                         performed, and patient medications and allergies were                         reviewed. The patient is competent. The risks and                         benefits of the procedure and the sedation options and                         risks were discussed with the patient. All questions                         were answered and informed consent was obtained.                         Patient identification and proposed procedure were                         verified by the physician, the nurse, the anesthetist                         and the technician in the endoscopy suite. Mental                         Status Examination: alert and oriented. Airway                         Examination: normal oropharyngeal airway and neck                         mobility. Respiratory Examination: clear to                         auscultation. CV Examination: RRR, no murmurs, no S3                         or S4. Prophylactic Antibiotics: The patient does not  require prophylactic antibiotics. Prior                         Anticoagulants: The patient has taken no anticoagulant                         or antiplatelet agents. ASA Grade  Assessment: III - A                         patient with severe systemic disease. After reviewing                         the risks and benefits, the patient was deemed in                         satisfactory condition to undergo the procedure. The                         anesthesia plan was to use monitored anesthesia care                         (MAC). Immediately prior to administration of                         medications, the patient was re-assessed for adequacy                         to receive sedatives. The heart rate, respiratory                         rate, oxygen saturations, blood pressure, adequacy of                         pulmonary ventilation, and response to care were                         monitored throughout the procedure. The physical                         status of the patient was re-assessed after the                         procedure.                        After obtaining informed consent, the endoscope was                         passed under direct vision. Throughout the procedure,                         the patient's blood pressure, pulse, and oxygen                         saturations were monitored continuously. The Endoscope                         was introduced through the mouth, and advanced to the  second part of duodenum. The upper GI endoscopy was                         accomplished without difficulty. The patient tolerated                         the procedure well. Findings:      The duodenal bulb, first portion of the duodenum and second portion of       the duodenum were normal. Estimated blood loss: none.      Mild portal hypertensive gastropathy was found in the gastric body. Not       actively bleeding upon entry into the stomach.      No signs of GAVE or gastric varices. Estimated blood loss: none.      The Z-line was regular. Estimated blood loss: none.      Esophagogastric landmarks were identified: the  gastroesophageal junction       was found at 40 cm from the incisors.      Grade II varices were found in the lower third of the esophagus. Two       columns. No signs/stigmata of recent bleeding. Estimated blood loss:       none.      Diffuse, yellow plaques were found in the entire esophagus. Estimated       blood loss: none.      The exam of the esophagus was otherwise normal. Impression:            - Normal duodenal bulb, first portion of the duodenum                         and second portion of the duodenum.                        - Portal hypertensive gastropathy.                        - Z-line regular.                        - Esophagogastric landmarks identified.                        - Grade II esophageal varices.                        - Esophageal plaques were found, suspicious for                         candidiasis.                        - No specimens collected. Recommendation:        - Patient has a contact number available for                         emergencies. The signs and symptoms of potential                         delayed complications were discussed with the patient.  Return to normal activities tomorrow. Written                         discharge instructions were provided to the patient.                        - Discharge patient to home.                        - Resume previous diet.                        - Continue present medications.                        - Recommend changing from metoprolol to carvedilol.                        Continue proton pump inhibitor therapy                        Will prescribe fluconazole for candidal esophagitis                        OK to resume trulicity                        - Repeat upper endoscopy in 1 year for surveillance.                        - Return to GI office at appointment to be scheduled.                        - The findings and recommendations were discussed with                          the patient's family.                        - The findings and recommendations were discussed with                         the patient. Procedure Code(s):     --- Professional ---                        [phone], Esophagogastroduodenoscopy, flexible,                         transoral; diagnostic, including collection of                         specimen(s) by brushing or washing, when performed                         (separate procedure) Diagnosis Code(s):     --- Professional ---                        K76.6, Portal hypertension                        K31.89, Other diseases of stomach and duodenum  I85.00, Esophageal varices without bleeding                        K22.9, Disease of esophagus, unspecified                        D50.0, Iron deficiency anemia secondary to blood loss                         (chronic) CPT copyright 2022 American Medical Association. All rights reserved. The codes documented in this report are preliminary and upon coder review may  be revised to meet current compliance requirements. Attending Participation:      I personally performed the entire procedure. Elfredia Nevins, DO [name] DO, DO 09/03/2023 1:50:44 PM This report has been signed electronically. Number of Addenda: 0 Note Initiated On: 09/03/2023 1:11 PM Estimated Blood Loss:  Estimated blood loss: none.      [facility]

## 2023-09-03 NOTE — Interval H&P Note (Signed)
History and Physical Interval Note: Preprocedure H&P from 09/03/23  was reviewed and there was no interval change after seeing and examining the patient.  Written consent was obtained from the patient after discussion of risks, benefits, and alternatives. Patient has consented to proceed with Esophagogastroduodenoscopy with possible intervention   09/03/2023 1:15 PM  Kendra Clarke  has presented today for surgery, with the diagnosis of Anemia, unspecified type (D64.9).  The various methods of treatment have been discussed with the patient and family. After consideration of risks, benefits and other options for treatment, the patient has consented to  Procedure(s): ESOPHAGOGASTRODUODENOSCOPY (EGD) (N/A) as a surgical intervention.  The patient's history has been reviewed, patient examined, no change in status, stable for surgery.  I have reviewed the patient's chart and labs.  Questions were answered to the patient's satisfaction.     [name]

## 2023-09-04 ENCOUNTER — Encounter: Payer: Self-pay | Admitting: Gastroenterology

## 2023-09-05 ENCOUNTER — Encounter: Payer: Self-pay | Admitting: Gastroenterology

## 2023-09-05 NOTE — Anesthesia Postprocedure Evaluation (Signed)
Anesthesia Post Note  Patient: Kendra Clarke  Procedure(s) Performed: ESOPHAGOGASTRODUODENOSCOPY (EGD)  Patient location during evaluation: Endoscopy Anesthesia Type: General Level of consciousness: awake and alert Pain management: pain level controlled Vital Signs Assessment: post-procedure vital signs reviewed and stable Respiratory status: spontaneous breathing, nonlabored ventilation, respiratory function stable and patient connected to nasal cannula oxygen Cardiovascular status: blood pressure returned to baseline and stable Postop Assessment: no apparent nausea or vomiting Anesthetic complications: no   No notable events documented.   Last Vitals:  Vitals:   09/03/23 1349 09/03/23 1359  BP: (!) 142/64 (!) 162/72  Pulse: 82 85  Resp: 18 18  Temp: (!) 36.4 C   SpO2: 100% 100%    Last Pain:  Vitals:   09/03/23 1359  TempSrc:   PainSc: 0-No pain                 Lenard Simmer

## 2023-09-05 NOTE — Anesthesia Preprocedure Evaluation (Signed)
Anesthesia Evaluation  Patient identified by MRN, date of birth, ID band Patient awake    Reviewed: Allergy & Precautions, H&P , NPO status , Patient's Chart, lab work & pertinent test results, reviewed documented beta blocker date and time   History of Anesthesia Complications Negative for: history of anesthetic complications  Airway Mallampati: III  TM Distance: >3 FB Neck ROM: full    Dental  (+) Poor Dentition   Pulmonary shortness of breath and with exertion, asthma , pneumonia, COPD   Pulmonary exam normal breath sounds clear to auscultation       Cardiovascular Exercise Tolerance: Good hypertension, (-) angina +CHF  (-) Past MI Normal cardiovascular exam Rhythm:regular Rate:Normal     Neuro/Psych negative neurological ROS  negative psych ROS   GI/Hepatic negative GI ROS, Neg liver ROS,,,  Endo/Other  diabetes    Renal/GU CRFRenal disease  negative genitourinary   Musculoskeletal   Abdominal   Peds  Hematology  (+) Blood dyscrasia, anemia   Anesthesia Other Findings Past Medical History: No date: Arthritis No date: Asthma No date: COPD (chronic obstructive pulmonary disease) (HCC) No date: Diabetes mellitus without complication (HCC) No date: Hypertension No date: Liver disease No date: Umbilical hernia   Reproductive/Obstetrics negative OB ROS                              Anesthesia Physical Anesthesia Plan  ASA: 3  Anesthesia Plan: General   Post-op Pain Management:    Induction:   PONV Risk Score and Plan: 3 and Propofol infusion and TIVA  Airway Management Planned:   Additional Equipment:   Intra-op Plan:   Post-operative Plan:   Informed Consent: I have reviewed the patients History and Physical, chart, labs and discussed the procedure including the risks, benefits and alternatives for the proposed anesthesia with the patient or authorized representative  who has indicated his/her understanding and acceptance.     Dental Advisory Given  Plan Discussed with: Anesthesiologist, CRNA and Surgeon  Anesthesia Plan Comments:          Anesthesia Quick Evaluation

## 2023-09-09 NOTE — Progress Notes (Signed)
 PATIENT PROFILE: Kendra Clarke is a 82 y.o. female who presents to the [facility] for consultation at the request of Dr. 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   HISTORY OF PRESENT ILLNESS   [name] presents to the [place] [facility] clinic as new patient at the request of Dr. Elspeth Jungling to establish care for compensated nonalcoholic cirrhosis c/b Grade II esophageal varices without bleeding and recent hospital admission for symptomatic anemia with no overt GI bleeding. She was recently admitted at [facility] 12/10 - 12/12 for chief complaint of generalized weakness, cough, and congestion. She was found to have symptomatic anemia with hemoglobin 6.5 (12 a year ago) with negative guaiac. CXR showed small right pleural effusion with right basilar atelectasis or infiltrate. She was started on Rocephin  and Azithromycin  for pneumonia and transfused 2 units pRBCs. No overt GI bleeding. She was seen as a consult by Suzen Barefoot, NP, and Dr. Jungling and inpatient luminal evaluation was deferred until she was optimized from cardiac and respiratory standpoint. She also needed to have her Trulicity held for 1 week. Cross-sectional imaging commented on cirrhosis which was not a new findings (nodular liver noted on CT in 2016). She received IV iron  infusions in the hospital. Since discharge from the hospital she reports she is doing fairly well. She denies any further weakness, fatigue, and reports no overt GI bleeding. She reports she had some medication changes in the hospital. She completed course of fluconazole. She had her beta blocker changed from metoprolol  to carvedilol. She reports she is compliant with medications. She denies any acute liver-related symptoms such as jaundice, pruritus, altered mental status, abdominal swelling, lower extremity edema, nausea,  vomiting, hematochezia, or melena. She denies any frequent heartburn or acid reflux symptoms. She denies any family history of cirrhosis. She denies any EtOH use. She lives with her 71 year old granddaughter and nephew.    GENERAL REVIEW OF SYSTEMS: General: negative for - fever, chills, weight gain, weight loss, fatigue Head: no injury, migraines, headaches Eyes: no jaundice, itching, dryness, tearing, redness, vision changes Nose: no injury, bleeding Mouth/Throat: no oral ulcers, swollen neck, dry mouth, sore throat, hoarseness Endocrine: no heat/cold intolerance Respiratory: no cough, wheezing, SOB Cardiovascular: no chest pain, palpitations GI: see HPI Musculoskeletal: no joint swelling, muscle/joint pain  Neurological: no seizures, syncope, dizziness, numbness/tingling Skin: no rashes, itching Hematological and Lymphatic: easy bruising, easy bleeding  MEDICATIONS: Outpatient Encounter Medications as of 09/09/2023  Medication Sig Dispense Refill  . amLODIPine  (NORVASC ) 10 MG tablet Take by mouth Take 10 mg by mouth daily.    . carvediloL (COREG) 6.25 MG tablet Take 1 tablet (6.25 mg total) by mouth 2 (two) times daily with meals 60 tablet 5  . fluconazole (DIFLUCAN) 100 MG tablet Take 4 (Four) tablets (400 mg) on day 1 (one). Take 1 (one) tablet (100mg ) for the next 13 days thereafter to complete 14 day course. 17 tablet 0  . glipiZIDE  (GLUCOTROL  XL) 10 MG XL tablet Take by mouth Take 10 mg by mouth daily.    SABRA ipratropium (ATROVENT  HFA) inhaler Inhale into the lungs Inhale 2 puffs into the lungs every 4 (four) hours as needed for wheezing.    . lisinopriL  (ZESTRIL ) 20 MG tablet Take 20 mg by mouth once daily    . SYMBICORT  160-4.5 mcg/actuation inhaler     . TRULICITY 0.75 mg/0.5 mL  pen injector     . [DISCONTINUED] carvediloL (COREG) 3.125 MG tablet Take 1 tablet (3.125 mg total) by mouth 2 (two) times daily with meals for 60 days Stop metoprolol  60 tablet 1  . metFORMIN   (GLUCOPHAGE ) 1000 MG tablet Take by mouth Take 1,000 mg by mouth 2 (two) times daily with a meal. (Patient not taking: Reported on 09/09/2023)     No facility-administered encounter medications on file as of 09/09/2023.    ALLERGIES: Albuterol , Codeine, and Sulfa (sulfonamide antibiotics)  PAST MEDICAL HISTORY: Past Medical History:  Diagnosis Date  . Asthma, unspecified asthma severity, unspecified whether complicated, unspecified whether persistent   . Diabetes mellitus without complication (CMS-HCC)   . Gout, joint   . Hyperlipidemia   . Hypertension     PAST SURGICAL HISTORY: Past Surgical History:  Procedure Laterality Date  . CHOLECYSTECTOMY OPEN    . HYSTERECTOMY       FAMILY HISTORY: No family history on file.   SOCIAL HISTORY: Social History   Socioeconomic History  . Marital status: Widowed  Tobacco Use  . Smoking status: Never  . Smokeless tobacco: Never  Vaping Use  . Vaping status: Never Used  Substance and Sexual Activity  . Alcohol use: Never  . Drug use: Never  . Sexual activity: Defer   Social Drivers of Health   Financial Resource Strain: Low Risk  (09/09/2023)   Overall Financial Resource Strain (CARDIA)   . Difficulty of Paying Living Expenses: Not very hard  Food Insecurity: No Food Insecurity (09/09/2023)   Hunger Vital Sign   . Worried About Programme researcher, broadcasting/film/video in the Last Year: Never true   . Ran Out of Food in the Last Year: Never true  Transportation Needs: No Transportation Needs (09/09/2023)   PRAPARE - Transportation   . Lack of Transportation (Medical): No   . Lack of Transportation (Non-Medical): No    Vitals:   09/09/23 0844  BP: (!) 157/76  Pulse: 86  Weight: 76.2 kg (168 lb)  Height: 162.6 cm (5' 4)    Wt Readings from Last 3 Encounters:  09/09/23 76.2 kg (168 lb)  03/27/19 80.3 kg (177 lb)     PHYSICAL EXAM: Pleasant, non-toxic appearing female on exam bench. Answers all questions appropriately and  thoroughly.  General: NAD, alert and oriented x 4 HEENT: PEERLA, EOMI, anciteric  Neck: supple, no JVD or thyromegaly. No lymphadenopathy.  Respiratory: CTA bilaterally, no wheezes, crackles, or other adventitious sounds Cardiac: RRR, no murmur, rub, or gallop  GI: soft, normal bowel sounds, no TTP, no HSM, no rebound or guarding Msk: no edema, well perfused with 2+ pulses, Skin: Skin color, texture, turgor normal, no rashes or lesions Lymph: no LAD Neuro: Grossly intact   REVIEW OF DATA: I have reviewed the following data today:  Previous OV notes with PCP reviewed - see HPI  EGD 09/03/2023 [place]) - esophageal plaques were found suspicious for candidiasis, regular Z-line, Grade II esophageal varices (two columns) with no signs or stigmata of recent bleeding, portal hypertensive gastropathy, normal duodenum   CT abd/pelvis with contrast 08/27/2023: IMPRESSION:  1. Large umbilical hernia, containing mesenteric fat and a segment  of the mid transverse colon. There is diffuse mesenteric edema and a  small amount of free fluid within the hernia sac, consistent with  incarcerated hernia. No evidence of bowel obstruction or ischemia.  2. Cirrhosis, with portal venous hypertension manifested by upper  abdominal varices and splenomegaly.  3. Small right pleural effusion.  4.  Aortic Atherosclerosis (ICD10-I70.0).    ASSESSMENT AND PLAN: Ms. Fleece is a 83 y.o. female presenting for an initial consultation.   Diagnoses and all orders for this visit:  Cirrhosis, non-alcoholic (CMS/HHS-HCC) -     CBC w/auto Differential (5 Part) -     Basic Metabolic Panel (BMP) -     Hepatic Function Panel (HFP) -     Prothrombin Time (INR) -     AFP, Serum, Tumor Marker - [facility] -     US  abdomen complete; Future  Secondary esophageal varices without bleeding (CMS/HHS-HCC) -     CBC w/auto Differential (5 Part) -     Basic Metabolic Panel (BMP) -     Hepatic Function Panel (HFP) -      Prothrombin Time (INR) -     AFP, Serum, Tumor Marker - [facility] -     carvediloL (COREG) 6.25 MG tablet; Take 1 tablet (6.25 mg total) by mouth 2 (two) times daily with meals -     US  abdomen complete; Future  Portal hypertensive gastropathy  (CMS/HHS-HCC) -     CBC w/auto Differential (5 Part) -     Basic Metabolic Panel (BMP) -     Hepatic Function Panel (HFP) -     Prothrombin Time (INR) -     AFP, Serum, Tumor Marker - [facility] -     US  abdomen complete; Future  Iron  deficiency anemia due to chronic blood loss -     CBC w/auto Differential (5 Part) -     Basic Metabolic Panel (BMP) -     Hepatic Function Panel (HFP) -     Prothrombin Time (INR) -     AFP, Serum, Tumor Marker - [facility] -     Ferritin -     Iron  Panel  Esophageal candidiasis (CMS/HHS-HCC)  Chronic diastolic CHF (congestive heart failure) (CMS/HHS-HCC) -     Ambulatory Referral to Cardiology    82 y/o female with a PMH of HTN, T2DM, dCHF, asthma, COPD, and OA presents to the [facility] for hospital follow-up  Compensated nonalcoholic cirrhosis - 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 Reviewed diagnosis of cirrhosis and management plan centered around working to prevent further hepatic insult and preventing hepatic decompensation.  - Labs today - CBC, BMP, HFP, INR, AFP, ferritin, and iron  panel - Reviewed importance of HCC surveillance every 6 months for tumor screening.  - Avoid hepatotoxins. Avoid NSAIDs. Tylenol  OK not to exceed 2 grams/daily.  - Repeat EGD in 1-year for variceal surveillance  - She appears to be tolerating low dose Carvedilol well (HR 86) so will increase Carvedilol to 6.25 mg twice daily with meals. Goal HR 55-60 bpm.  - Continue daily PPI - Continue  alcohol abstinence  - Referral placed to outpatient Cardiology here at [facility] at her request  - Follow-up in office every 73-months   Attestation Statement:   I personally performed the service, non-incident to. [facility])   TWYLA JONETTE PRIMMER, PA     JONETTE PRIMMER, PA-C [facility], Gastroenterology [address] [place], [place] [address] [phone]

## 2023-09-25 ENCOUNTER — Other Ambulatory Visit: Payer: Self-pay | Admitting: Gastroenterology

## 2023-09-25 DIAGNOSIS — K3189 Other diseases of stomach and duodenum: Secondary | ICD-10-CM

## 2023-09-25 DIAGNOSIS — K7460 Unspecified cirrhosis of liver: Secondary | ICD-10-CM

## 2023-09-25 DIAGNOSIS — I8510 Secondary esophageal varices without bleeding: Secondary | ICD-10-CM

## 2023-09-25 NOTE — ED Provider Notes (Signed)
 [facility] Emergency Department Provider Note   ED Clinical Impression   Final diagnoses:  Chronic obstructive pulmonary disease, unspecified COPD type (CMS-HCC) (Primary)  Hypomagnesemia  Hypokalemia    Impression, Medical Decision Making, ED Course   Impression: 83 y.o. female who has a past medical history of Diabetes mellitus (CMS-HCC) and Hypertension. who presents with 2 weeks of shortness of breath that acutely worsened tonight with dyspnea in addition to chest pain that began yesterday as described below.   DDx/MDM: Patient presents with vague shortness of breath/dyspnea.  Differential diagnosis is broad.  Will obtain troponin and ECG to evaluate for cardiac ischemia or other cardiac causes, proBNP to evaluate for congestive heart failure, CBC to evaluate for anemia, electrolyte panel to evaluate for renal dysfunction or other electrolyte abnormalities, and chest x-ray to evaluate for pneumonia, pneumothorax.   Diagnostic workup as below.   Orders Placed This Encounter  Procedures  . Influenza/ RSV/COVID PCR  . XR Chest 2 views  . Pro-BNP  . Comprehensive Metabolic Panel  . CBC w/ Differential  . Magnesium   . PT-INR  . hsTroponin I (serial 0-2-6H w/ delta)  . Blood Gas, Venous  . hsTroponin I - 2 Hour  . hsTroponin I - 6 Hour  . ECG 12 Lead  . ECG 12 Lead  . ECG 12 Lead  . ECG 12 Lead    ED Course as of 09/25/23 0649  Wed Sep 25, 2023  0324 Independent hypertension EKG: Sinus rhythm at a rate of 99, PR 152, QRS 132, QTc 510.  Right bundle branch block, similar morphology to prior.  T wave inversions in V1, which are noted on previous EKG from May 2023.  No ST elevation or depression.  0456 XR Chest 2 views IMPRESSION:   Mild peribronchial cuffing suggestive of small airway disease.   9488 On reevaluation patient continues to be well-appearing, no wheezing examination, SpO2 98% on room air.  Clinical presentation is most consistent with a COPD exacerbation  that is much improved.  Of note the patient has reported taking as much of my Atrovent  as possible before I came in.  Given the fact that this patient has had no wheezing on examination since arrival in the ED, but does have a history of COPD, the patient feels that this feels like a COPD exacerbation, the patient is treated themselves appropriately prior to arrival with multiple Atrovent  uses within the last several hours prior to presentation, likely has a COPD exacerbation that the patient has started to treat at home adequately.  Will give the patient 60 mg of prednisone , as well as steroids for the next several days at discharge.  Will cover the patient with antibiotics given the increase of green sputum over the last 2 weeks.   MDM Elements Discussion of Management with other Physicians, QHP or Appropriate Source: None I have reviewed recent and relevant previous record, including: Inpatient notes - discharge summary 08/29/23 for history Social Determinants that significantly affected care: none ____________________________________________  The case was discussed with the attending physician, who is in agreement with the above assessment and plan.    History   Chief Complaint Chief Complaint  Patient presents with  . Shortness of Breath   HPI  Kendra Clarke is a 83 y.o. female with past medical history as below who presents with 2 weeks of shortness of breath that worsened tonight. She endorses associated chest pain that began yesterday around lunchtime. She has had a cough recently which has  been productive with green sputum. She has been compliant with her diuretic medication at home, baseline BL lower ext swelling.  She does not have a baseline O2 requirement. She has used her Atrovent  inhaler at home for her shortness of breath multiple times before coming in (at least 10), but otherwise no breathing treatments. Denies abdominal pain, nausea, vomiting, diarrhea, or fevers.    Outside Historian(s): N/A  Past Medical History:  Diagnosis Date  . Diabetes mellitus (CMS-HCC)   . Hypertension    No past surgical history on file.  No current facility-administered medications for this encounter.  Current Outpatient Medications:  .  amLODIPine  (NORVASC ) 10 MG tablet, Take 1 tablet (10 mg total) by mouth in the morning., Disp: , Rfl:  .  amoxicillin -clavulanate (AUGMENTIN ) 875-125 mg per tablet, Take 1 tablet by mouth two (2) times a day for 7 days., Disp: 14 tablet, Rfl: 0 .  dulaglutide (TRULICITY) 0.75 mg/0.5 mL injection pen, Inject 0.5 mL (0.75 mg total) under the skin once a week., Disp: , Rfl:  .  ipratropium (ATROVENT  HFA) 17 mcg/actuation inhaler, Inhale 2 puffs every four (4) hours as needed., Disp: , Rfl:  .  lisinopriL  (PRINIVIL ,ZESTRIL ) 20 MG tablet, Take 1 tablet (20 mg total) by mouth in the morning., Disp: , Rfl:  .  predniSONE  (DELTASONE ) 20 MG tablet, Take 2 tablets (40 mg total) by mouth daily for 5 days., Disp: 10 tablet, Rfl: 0 .  SYMBICORT  160-4.5 mcg/actuation inhaler, Inhale 2 puffs  in the morning and 2 puffs in the evening., Disp: , Rfl:  .  torsemide (DEMADEX) 20 MG tablet, Take 3 tablets (60 mg total) by mouth daily., Disp: 90 tablet, Rfl: 0  Allergies Albuterol , Codeine, and Sulfa (sulfonamide antibiotics)  Family History No family history on file.  Social History Social History   Tobacco Use  . Smoking status: Never  . Smokeless tobacco: Never  Substance Use Topics  . Alcohol use: Not Currently     Physical Exam   VITAL SIGNS:    Vitals:   09/25/23 0252 09/25/23 0300 09/25/23 0400  BP: 172/85 157/75 174/80  Pulse: 105  87  Resp: 22  14  Temp: 36.9 C (98.4 F)    TempSrc: Oral    SpO2: 100% 100% 100%   Constitutional: Alert and oriented. No acute distress. Eyes: Conjunctivae are normal. HEENT: Normocephalic and atraumatic. Conjunctivae clear. No congestion. Moist mucous membranes.  Cardiovascular: Rate as above,  regular rhythm. Normal and symmetric distal pulses. Brisk capillary refill. Normal skin turgor.  1+ pitting edema symmetric in the bilateral lower extremities. Respiratory: Normal respiratory effort. Breath sounds are normal. There are no wheezing or crackles heard. Gastrointestinal: Soft, non-distended, non-tender. Genitourinary: Deferred. Musculoskeletal: Non-tender with normal range of motion in all extremities. Neurologic: Normal speech and language. No gross focal neurologic deficits are appreciated. Patient is moving all extremities equally, face is symmetric at rest and with speech. Skin: Skin is warm, dry and intact. No rash noted. Psychiatric: Mood and affect are normal. Speech and behavior are normal.   Radiology   XR Chest 2 views  Preliminary Result    Mild peribronchial cuffing suggestive of small airway disease.     Pertinent labs & imaging results that were available during my care of the patient were independently interpreted by me and considered in my medical decision making (see chart for details).  Portions of this record have been created using Scientist, clinical (histocompatibility and immunogenetics). Dictation errors have been sought, but may not have  been identified and corrected.  Documentation assistance was provided by Rankin Sizer, Scribe, on September 25, 2023 at 2:47 AM for Reena Flock, DO.  Documentation assistance provided by the above mentioned scribe. I was present during the time the encounter was recorded. The information recorded by the scribe was done at my direction and has been reviewed and validated by me.     Flock Reena, MD Resident 09/25/23 [phone]

## 2023-10-22 ENCOUNTER — Ambulatory Visit: Payer: Medicare HMO

## 2024-05-21 ENCOUNTER — Other Ambulatory Visit: Payer: Self-pay

## 2024-05-21 ENCOUNTER — Inpatient Hospital Stay
Admission: EM | Admit: 2024-05-21 | Discharge: 2024-05-28 | DRG: 291 | Disposition: A | Discharge status: Home Health | Attending: Student in an Organized Health Care Education/Training Program | Admitting: Student in an Organized Health Care Education/Training Program

## 2024-05-21 ENCOUNTER — Emergency Department

## 2024-05-21 DIAGNOSIS — E785 Hyperlipidemia, unspecified: Secondary | ICD-10-CM | POA: Diagnosis present

## 2024-05-21 DIAGNOSIS — Z90710 Acquired absence of both cervix and uterus: Secondary | ICD-10-CM | POA: Diagnosis not present

## 2024-05-21 DIAGNOSIS — R001 Bradycardia, unspecified: Secondary | ICD-10-CM | POA: Diagnosis present

## 2024-05-21 DIAGNOSIS — Z1152 Encounter for screening for COVID-19: Secondary | ICD-10-CM

## 2024-05-21 DIAGNOSIS — J4489 Other specified chronic obstructive pulmonary disease: Secondary | ICD-10-CM | POA: Diagnosis present

## 2024-05-21 DIAGNOSIS — Z9049 Acquired absence of other specified parts of digestive tract: Secondary | ICD-10-CM

## 2024-05-21 DIAGNOSIS — I2489 Other forms of acute ischemic heart disease: Secondary | ICD-10-CM | POA: Diagnosis present

## 2024-05-21 DIAGNOSIS — Z823 Family history of stroke: Secondary | ICD-10-CM

## 2024-05-21 DIAGNOSIS — I493 Ventricular premature depolarization: Secondary | ICD-10-CM | POA: Diagnosis not present

## 2024-05-21 DIAGNOSIS — Z7951 Long term (current) use of inhaled steroids: Secondary | ICD-10-CM | POA: Diagnosis not present

## 2024-05-21 DIAGNOSIS — Z791 Long term (current) use of non-steroidal anti-inflammatories (NSAID): Secondary | ICD-10-CM

## 2024-05-21 DIAGNOSIS — Z8249 Family history of ischemic heart disease and other diseases of the circulatory system: Secondary | ICD-10-CM

## 2024-05-21 DIAGNOSIS — I10 Essential (primary) hypertension: Secondary | ICD-10-CM | POA: Diagnosis not present

## 2024-05-21 DIAGNOSIS — I509 Heart failure, unspecified: Principal | ICD-10-CM

## 2024-05-21 DIAGNOSIS — Z885 Allergy status to narcotic agent status: Secondary | ICD-10-CM | POA: Diagnosis not present

## 2024-05-21 DIAGNOSIS — M1990 Unspecified osteoarthritis, unspecified site: Secondary | ICD-10-CM | POA: Diagnosis present

## 2024-05-21 DIAGNOSIS — E1142 Type 2 diabetes mellitus with diabetic polyneuropathy: Secondary | ICD-10-CM | POA: Diagnosis present

## 2024-05-21 DIAGNOSIS — I352 Nonrheumatic aortic (valve) stenosis with insufficiency: Secondary | ICD-10-CM | POA: Diagnosis present

## 2024-05-21 DIAGNOSIS — E669 Obesity, unspecified: Secondary | ICD-10-CM | POA: Diagnosis present

## 2024-05-21 DIAGNOSIS — Z79899 Other long term (current) drug therapy: Secondary | ICD-10-CM

## 2024-05-21 DIAGNOSIS — I472 Ventricular tachycardia, unspecified: Secondary | ICD-10-CM | POA: Diagnosis not present

## 2024-05-21 DIAGNOSIS — Z7985 Long-term (current) use of injectable non-insulin antidiabetic drugs: Secondary | ICD-10-CM | POA: Diagnosis not present

## 2024-05-21 DIAGNOSIS — K469 Unspecified abdominal hernia without obstruction or gangrene: Secondary | ICD-10-CM | POA: Diagnosis present

## 2024-05-21 DIAGNOSIS — Z825 Family history of asthma and other chronic lower respiratory diseases: Secondary | ICD-10-CM

## 2024-05-21 DIAGNOSIS — D509 Iron deficiency anemia, unspecified: Secondary | ICD-10-CM | POA: Diagnosis present

## 2024-05-21 DIAGNOSIS — Z888 Allergy status to other drugs, medicaments and biological substances status: Secondary | ICD-10-CM

## 2024-05-21 DIAGNOSIS — I110 Hypertensive heart disease with heart failure: Principal | ICD-10-CM | POA: Diagnosis present

## 2024-05-21 DIAGNOSIS — Z882 Allergy status to sulfonamides status: Secondary | ICD-10-CM | POA: Diagnosis not present

## 2024-05-21 DIAGNOSIS — K219 Gastro-esophageal reflux disease without esophagitis: Secondary | ICD-10-CM | POA: Diagnosis present

## 2024-05-21 DIAGNOSIS — E871 Hypo-osmolality and hyponatremia: Secondary | ICD-10-CM | POA: Diagnosis present

## 2024-05-21 DIAGNOSIS — E876 Hypokalemia: Secondary | ICD-10-CM | POA: Diagnosis present

## 2024-05-21 DIAGNOSIS — Z66 Do not resuscitate: Secondary | ICD-10-CM | POA: Diagnosis present

## 2024-05-21 DIAGNOSIS — Z820 Family history of epilepsy and other diseases of the nervous system: Secondary | ICD-10-CM

## 2024-05-21 DIAGNOSIS — Z6837 Body mass index (BMI) 37.0-37.9, adult: Secondary | ICD-10-CM

## 2024-05-21 DIAGNOSIS — R0902 Hypoxemia: Secondary | ICD-10-CM | POA: Diagnosis present

## 2024-05-21 DIAGNOSIS — I5033 Acute on chronic diastolic (congestive) heart failure: Secondary | ICD-10-CM | POA: Diagnosis present

## 2024-05-21 DIAGNOSIS — E8770 Fluid overload, unspecified: Secondary | ICD-10-CM

## 2024-05-21 DIAGNOSIS — I4510 Unspecified right bundle-branch block: Secondary | ICD-10-CM | POA: Diagnosis present

## 2024-05-21 LAB — COMPREHENSIVE METABOLIC PANEL WITH GFR
ALT: 23 U/L (ref 0–44)
AST: 47 U/L — ABNORMAL HIGH (ref 15–41)
Albumin: 3.3 g/dL — ABNORMAL LOW (ref 3.5–5.0)
Alkaline Phosphatase: 62 U/L (ref 38–126)
Anion gap: 12 (ref 5–15)
BUN: 10 mg/dL (ref 8–23)
CO2: 23 mmol/L (ref 22–32)
Calcium: 9.2 mg/dL (ref 8.9–10.3)
Chloride: 95 mmol/L — ABNORMAL LOW (ref 98–111)
Creatinine, Ser: 0.8 mg/dL (ref 0.44–1.00)
GFR, Estimated: >60 mL/min (ref >60)
Glucose, Bld: 144 mg/dL — ABNORMAL HIGH (ref 70–99)
Potassium: 4.8 mmol/L (ref 3.5–5.1)
Sodium: 130 mmol/L — ABNORMAL LOW (ref 135–145)
Total Bilirubin: 1.2 mg/dL (ref 0.0–1.2)
Total Protein: 6.6 g/dL (ref 6.5–8.1)

## 2024-05-21 LAB — RESP PANEL BY RT-PCR (RSV, FLU A&B, COVID)  RVPGX2
Influenza A by PCR: NEGATIVE
Influenza B by PCR: NEGATIVE
Resp Syncytial Virus by PCR: NEGATIVE
SARS Coronavirus 2 by RT PCR: NEGATIVE

## 2024-05-21 LAB — CBC WITH DIFFERENTIAL/PLATELET
Abs Immature Granulocytes: 0.02 K/uL (ref 0.00–0.07)
Basophils Absolute: 0 K/uL (ref 0.0–0.1)
Basophils Relative: 0 %
Eosinophils Absolute: 0 K/uL (ref 0.0–0.5)
Eosinophils Relative: 0 %
HCT: 28.5 % — ABNORMAL LOW (ref 36.0–46.0)
Hemoglobin: 8.7 g/dL — ABNORMAL LOW (ref 12.0–15.0)
Immature Granulocytes: 0 %
Lymphocytes Relative: 6 %
Lymphs Abs: 0.4 K/uL — ABNORMAL LOW (ref 0.7–4.0)
MCH: 24 pg — ABNORMAL LOW (ref 26.0–34.0)
MCHC: 30.5 g/dL (ref 30.0–36.0)
MCV: 78.5 fL — ABNORMAL LOW (ref 80.0–100.0)
Monocytes Absolute: 0.4 K/uL (ref 0.1–1.0)
Monocytes Relative: 6 %
Neutro Abs: 5.6 K/uL (ref 1.7–7.7)
Neutrophils Relative %: 88 %
Platelets: 169 K/uL (ref 150–400)
RBC: 3.63 MIL/uL — ABNORMAL LOW (ref 3.87–5.11)
RDW: 15.9 % — ABNORMAL HIGH (ref 11.5–15.5)
WBC: 6.5 K/uL (ref 4.0–10.5)
nRBC: 0 % (ref 0.0–0.2)

## 2024-05-21 LAB — BLOOD GAS, VENOUS
Acid-Base Excess: 0.7 mmol/L (ref 0.0–2.0)
Bicarbonate: 25.4 mmol/L (ref 20.0–28.0)
O2 Saturation: 67.4 %
Patient temperature: 37
pCO2, Ven: 40 mmHg — ABNORMAL LOW (ref 44–60)
pH, Ven: 7.41 (ref 7.25–7.43)
pO2, Ven: 39 mmHg (ref 32–45)

## 2024-05-21 LAB — LACTIC ACID, PLASMA: Lactic Acid, Venous: 1.8 mmol/L (ref 0.5–1.9)

## 2024-05-21 LAB — TROPONIN I (HIGH SENSITIVITY): Troponin I (High Sensitivity): 60 ng/L — ABNORMAL HIGH (ref <18)

## 2024-05-21 LAB — BRAIN NATRIURETIC PEPTIDE: B Natriuretic Peptide: 1736.3 pg/mL — ABNORMAL HIGH (ref 0.0–100.0)

## 2024-05-21 LAB — LIPASE, BLOOD: Lipase: 26 U/L (ref 11–51)

## 2024-05-21 LAB — PROCALCITONIN: Procalcitonin: 0.16 ng/mL

## 2024-05-21 MED ORDER — FUROSEMIDE 10 MG/ML IJ SOLN
40.0000 mg | Freq: Once | INTRAMUSCULAR | Status: AC
  Administered 2024-05-21: 40 mg via INTRAVENOUS
  Filled 2024-05-21: qty 4

## 2024-05-21 NOTE — ED Triage Notes (Signed)
 Patient C/O SOB that began for three days. EMS had been called out to her house three times today, but patient had been refusing to come to the hospital. Per family according to EMS, patient has history of anxiety and they believe she is possibly attention-seeking. Patient denies any pain.

## 2024-05-21 NOTE — H&P (Signed)
 [place]   PATIENT NAME: Kendra Clarke    MR#:  [id number]  DATE OF BIRTH:  10/25/1940  DATE OF ADMISSION:  05/21/2024  PRIMARY CARE PHYSICIAN: Donnie Handing, PA   Patient is coming from: Home  REQUESTING/REFERRING PHYSICIAN: Nicholaus Crazier, MD  CHIEF COMPLAINT:   Chief Complaint  Patient presents with   Shortness of Breath    HISTORY OF PRESENT ILLNESS:  Kendra Clarke is a 83 y.o. Caucasian female with medical history significant for COPD, asthma, osteoarthritis, type 2 diabetes mellitus and hypertension, presented to the emergency room with acute onset of worsening dyspnea with associated 4 pillow orthopnea and paroxysmal nocturnal dyspnea.  She admitted to worsening lower extremity edema as well as dyspnea on exertion lately.  No fever or chills.  She has been having dry cough and wheezing.  No dysuria, oliguria or hematuria or flank pain.  No fever or chills.  No nausea or vomiting or abdominal pain.  No bleeding diathesis.  ED Course: When she came to the ER, BP was 160/77 with heart rate of 111 and respiratory to 24.  Labs revealed VBG with pH 7.41 and HCO3 25.4.  CMP revealed sodium 130 potassium 4.8 with chloride of 95 and albumin  3.3 with AST 47 otherwise unremarkable CMP.  BNP was significantly high at 1736.3.  High sensitive troponin I was 16 and later the same.  Lactic acid was 1.8.  CBC showed anemia with hemoglobin 8.7 hematocrit 24 8.5 with microcytosis.  Respiratory panel came back negative.  UA was negative.  Blood cultures were drawn. EKG as reviewed by me : We will EKG showed sinus tachycardia with a rate of 113, right bundle branch block and left posterior fascicular block with lateral T wave inversion. Imaging: Portable chest x-ray showed cardiomegaly with increased central interstitial markings bilaterally likely pulmonary edema and cluster of micronodules in the right upper lobe likely infectious or inflammatory.  The patient was given 40 mg of IV  Lasix .  She will be admitted to a cardiac telemetry bed for further evaluation and management. PAST MEDICAL HISTORY:   Past Medical History:  Diagnosis Date   Arthritis    Asthma    COPD (chronic obstructive pulmonary disease) (HCC)    Diabetes mellitus without complication (HCC)    Hypertension    Liver disease    Umbilical hernia     PAST SURGICAL HISTORY:   Past Surgical History:  Procedure Laterality Date   ABDOMINAL HYSTERECTOMY     BLADDER SURGERY     CHOLECYSTECTOMY     ESOPHAGOGASTRODUODENOSCOPY N/A 09/03/2023   Procedure: ESOPHAGOGASTRODUODENOSCOPY (EGD);  Surgeon: Onita Elspeth Sharper, DO;  Location: [facility] ENDOSCOPY;  Service: Gastroenterology;  Laterality: N/A;   nose surgery      SOCIAL HISTORY:   Social History   Tobacco Use   Smoking status: Never   Smokeless tobacco: Never  Substance Use Topics   Alcohol use: No    FAMILY HISTORY:   Family History  Problem Relation Age of Onset   Multiple sclerosis Mother    Hypertension Father    COPD Father    Varicose Veins Maternal Aunt    Stroke Nephew     DRUG ALLERGIES:   Allergies  Allergen Reactions   Albuterol  Anaphylaxis and Rash    Other reaction(s): Other (See Comments) Patient states it gives her an asthma attack Other reaction(s): Other (See Comments), Respiratory Distress Patient states it gives her an asthma attack  Other reaction(s): Other (See  Comments) Patient states it gives her an asthma attack    Codeine Anxiety and Shortness Of Breath   Tramadol Hives   Sulfa Antibiotics Rash    REVIEW OF SYSTEMS:   ROS As per history of present illness. All pertinent systems were reviewed above. Constitutional, HEENT, cardiovascular, respiratory, GI, GU, musculoskeletal, neuro, psychiatric, endocrine, integumentary and hematologic systems were reviewed and are otherwise negative/unremarkable except for positive findings mentioned above in the HPI.   MEDICATIONS AT HOME:   Prior to  Admission medications   Medication Sig Start Date End Date Taking? Authorizing Provider  amLODipine  (NORVASC ) 10 MG tablet Take 10 mg by mouth daily. 09/24/17  Yes [provider]  ATROVENT  HFA 17 MCG/ACT inhaler Inhale into the lungs. 02/16/22  Yes [provider]  benzonatate  (TESSALON ) 200 MG capsule Take 1 capsule (200 mg total) by mouth 3 (three) times daily as needed for cough. 08/29/23  Yes Sreenath, Sudheer B, MD  Dulaglutide (TRULICITY) 0.75 MG/0.5ML SOPN Inject 0.75 mg into the skin once a week. 12/16/18  Yes [provider]  ergocalciferol  (VITAMIN D2) 1.25 MG (50000 UT) capsule Take 50,000 Units by mouth once a week.   Yes [provider]  hydrochlorothiazide  (MICROZIDE ) 12.5 MG capsule Take 12.5 mg by mouth daily. 05/07/23  Yes [provider]  lisinopril  (ZESTRIL ) 20 MG tablet Take 20 mg by mouth daily. 09/03/18  Yes [provider]  meloxicam (MOBIC) 15 MG tablet Take 15 mg by mouth daily. 07/01/23  Yes [provider]  pantoprazole  (PROTONIX ) 40 MG tablet Take 1 tablet (40 mg total) by mouth 2 (two) times daily before a meal. 08/29/23 05/21/24 Yes Sreenath, Sudheer B, MD  potassium chloride  (KLOR-CON  M) 10 MEQ tablet Take 10 mEq by mouth 2 (two) times daily. 06/17/23  Yes [provider]  SYMBICORT  160-4.5 MCG/ACT inhaler INHALE 2 PUFFS TWICE DAILY FOR ASTHMA 12/02/20  Yes [provider]  torsemide (DEMADEX) 20 MG tablet Take 40 mg by mouth daily. 02/13/22  Yes [provider]  gabapentin (NEURONTIN) 100 MG capsule Take 100 mg by mouth 2 (two) times daily. Patient not taking: Reported on 05/21/2024 07/08/23   [provider]  metoprolol  tartrate (LOPRESSOR ) 50 MG tablet Take 50 mg by mouth 2 (two) times daily. Patient not taking: Reported on 05/21/2024    [provider]      VITAL SIGNS:  Blood pressure (!) 194/97, pulse (!) 113, temperature 98.2 F (36.8 C), temperature source Oral,  resp. rate (!) 24, height 5' 4 (1.626 m), weight 99.4 kg, SpO2 99%.  PHYSICAL EXAMINATION:  Physical Exam  GENERAL:  83 y.o.-year-old Caucasian female patient lying in the bed with no acute distress.  EYES: Pupils equal, round, reactive to light and accommodation. No scleral icterus. Extraocular muscles intact.  HEENT: Head atraumatic, normocephalic. Oropharynx and nasopharynx clear.  NECK:  Supple, no jugular venous distention. No thyroid enlargement, no tenderness.  LUNGS: Diminished bibasilar breath sounds with bibasal rales.  No use of accessory muscles of respiration.  CARDIOVASCULAR: Regular rate and rhythm, S1, S2 normal. No murmurs, rubs, or gallops.  ABDOMEN: Soft, nondistended, nontender. Bowel sounds present. No organomegaly or mass.  EXTREMITIES: No pedal edema, cyanosis, or clubbing.  NEUROLOGIC: Cranial nerves II through XII are intact. Muscle strength 5/5 in all extremities. Sensation intact. Gait not checked.  PSYCHIATRIC: The patient is alert and oriented x 3.  Normal affect and good eye contact. SKIN: No obvious rash, lesion, or ulcer.   LABORATORY PANEL:  CBC Recent Labs  Lab 05/22/24 0610  WBC 6.4  HGB 8.8*  HCT 28.7*  PLT 157   ------------------------------------------------------------------------------------------------------------------  Chemistries  Recent Labs  Lab 05/21/24 2211 05/22/24 0610  NA 130* 129*  K 4.8 4.2  CL 95* 95*  CO2 23 25  GLUCOSE 144* 131*  BUN 10 11  CREATININE 0.80 0.82  CALCIUM  9.2 8.8*  AST 47*  --   ALT 23  --   ALKPHOS 62  --   BILITOT 1.2  --    ------------------------------------------------------------------------------------------------------------------  Cardiac Enzymes No results for input(s): TROPONINI in the last 168 hours. ------------------------------------------------------------------------------------------------------------------  RADIOLOGY:  DG Chest Portable 1 View Result Date:  05/21/2024 CLINICAL DATA:  Shortness of breath EXAM: PORTABLE CHEST 1 VIEW COMPARISON:  Chest x-ray 08/27/2023 FINDINGS: The heart is enlarged. There are increased central interstitial markings bilaterally. There is a cluster of micro nodules in the right upper lobe. There is atelectatic change in both lung bases. There is no pleural effusion or pneumothorax. No acute fractures are seen. IMPRESSION: 1. Cardiomegaly with increased central interstitial markings bilaterally, likely pulmonary edema. 2. Cluster of micro nodules in the right upper lobe, likely infectious or inflammatory. Electronically Signed   By: Greig Pique M.D.   On: 05/21/2024 22:44      IMPRESSION AND PLAN:  Assessment and Plan: * Acute on chronic diastolic CHF (congestive heart failure) (HCC)  -The patient will be admitted to a cardiac telemetry bed. - We will continue diuresis with IV Lasix . - We Will follow serial troponins. - We will follow I's and O's and daily weights. - Cardiology consult be obtained. - I notified Dr. Florencio about the patient. The-the patient had a 2D echo last year revealing an EF of 55 to 60% with grade 2 diastolic dysfunction, mild aortic stenosis with mild calcification of the aortic valve and mild aortic regurgitation.  GERD without esophagitis - Will continue PPI therapy.  Type 2 diabetes mellitus with peripheral neuropathy (HCC) - The patient will be placed on supplemental coverage with NovoLog . - Will continue Neurontin.  Essential hypertension - Will continue antihypertensive therapy.   DVT prophylaxis: Lovenox . Advanced Care Planning:  Code Status: The patient is DNR and DNI. Family Communication:  The plan of care was discussed in details with the patient (and family). I answered all questions. The patient agreed to proceed with the above mentioned plan. Further management will depend upon hospital course. Disposition Plan: Back to previous home environment Consults called:  none. All the records are reviewed and case discussed with ED provider.  Status is: Inpatient  At the time of the admission, it appears that the appropriate admission status for this patient is inpatient.  This is judged to be reasonable and necessary in order to provide the required intensity of service to ensure the patient's safety given the presenting symptoms, physical exam findings and initial radiographic and laboratory data in the context of comorbid conditions.  The patient requires inpatient status due to high intensity of service, high risk of further deterioration and high frequency of surveillance required.  I certify that at the time of admission, it is my clinical judgment that the patient will require inpatient hospital care extending more than 2 midnights.                            Dispo: The patient is from: Home  Anticipated d/c is to: Home              Patient currently is not medically stable to d/c.              Difficult to place patient: No  [place]ison DELENA Peaches M.D on 05/22/2024 at 7:39 AM  [facility]   From 7 PM-7 AM, contact night-coverage [email or web address]  CC: Primary care physician; Donnie Handing, PA

## 2024-05-21 NOTE — ED Provider Notes (Signed)
 [facility] Provider Note    Event Date/Time   First MD Initiated Contact with Patient 05/21/24 2200     (approximate)   History   Shortness of Breath   HPI  Kendra Clarke is a 83 y.o. female  COPD, diabetes, hypertension, diastolic CHF, who presents from home with 48 hours of progressively worsening shortness of breath and several weeks of increasing weight gain and lower extremity edema.  Patient called EMS 3 times to her residence today with a complaint of shortness of breath.  Per EMS she was hypoxic on fires arrival and placed on 2L  but does not usually use oxygen at baseline.  Patient reports compliance with home medications.  Patient reports chills but has not had any fevers.  She has a very large abdominal hernia which she reports is baseline for her.  Denies any SI HI or AVH S      Physical Exam   Triage Vital Signs: ED Triage Vitals  Encounter Vitals Group     BP      Girls Systolic BP Percentile      Girls Diastolic BP Percentile      Boys Systolic BP Percentile      Boys Diastolic BP Percentile      Pulse      Resp      Temp      Temp src      SpO2      Weight      Height      Head Circumference      Peak Flow      Pain Score      Pain Loc      Pain Education      Exclude from Growth Chart     Most recent vital signs: Vitals:   05/21/24 2230 05/21/24 2330  BP: (!) 160/77 (!) 161/95  Pulse: (!) 109 (!) 113  Resp: 17 (!) 24  Temp:    SpO2: 100% 99%    Nursing Triage Note reviewed. Vital signs reviewed and patients oxygen saturation is normoxic  General: Patient is well nourished, well developed, awake and alert, resting comfortably in no acute distress Head: Normocephalic and atraumatic Eyes: Normal inspection, extraocular muscles intact, no conjunctival pallor Ear, nose, throat: Normal external exam Neck: Normal range of motion Respiratory: Patient is in no respiratory distress, lungs with rales  bilaterally Cardiovascular: Patient is tachycardic, RR without murmur appreciated GI: Abd with very large ventral hernia with no overlying erythema with no guarding or rebound  Back: Normal inspection of the back with good strength and range of motion throughout all ext Extremities: pulses intact with good cap refills, 2+ bilateral lower extremity Neuro: The patient is alert and oriented to person, place, and time, appropriately conversive, with 5/5 bilat UE/LE strength, no gross motor or sensory defects noted.  Skin: Warm, dry, and intact Psych: Anxioius mood and affect, no SI or HI  ED Results / Procedures / Treatments   Labs (all labs ordered are listed, but only abnormal results are displayed) Labs Reviewed  BRAIN NATRIURETIC PEPTIDE - Abnormal; Notable for the following components:      Result Value   B Natriuretic Peptide 1,736.3 (*)    All other components within normal limits  BLOOD GAS, VENOUS - Abnormal; Notable for the following components:   pCO2, Ven 40 (*)    All other components within normal limits  CBC WITH DIFFERENTIAL/PLATELET - Abnormal; Notable for the following components:  RBC 3.63 (*)    Hemoglobin 8.7 (*)    HCT 28.5 (*)    MCV 78.5 (*)    MCH 24.0 (*)    RDW 15.9 (*)    Lymphs Abs 0.4 (*)    All other components within normal limits  COMPREHENSIVE METABOLIC PANEL WITH GFR - Abnormal; Notable for the following components:   Sodium 130 (*)    Chloride 95 (*)    Glucose, Bld 144 (*)    Albumin  3.3 (*)    AST 47 (*)    All other components within normal limits  TROPONIN I (HIGH SENSITIVITY) - Abnormal; Notable for the following components:   Troponin I (High Sensitivity) 60 (*)    All other components within normal limits  RESP PANEL BY RT-PCR (RSV, FLU A&B, COVID)  RVPGX2  CULTURE, BLOOD (ROUTINE X 2)  CULTURE, BLOOD (ROUTINE X 2)  LIPASE, BLOOD  LACTIC ACID, PLASMA  URINALYSIS, ROUTINE W REFLEX MICROSCOPIC  PROCALCITONIN  LACTIC ACID, PLASMA   TROPONIN I (HIGH SENSITIVITY)     EKG EKG and rhythm strip are interpreted by myself:   EKG: tachycardic sinus rhythm] at heart rate of 113, normal QRS duration, QTc 487, nonspecific ST segments and T waves no ectopy EKG not consistent with Acute STEMI Rhythm strip: tachcyardic rhythm in lead II   RADIOLOGY Xray chest: Pulm edema my independent review interpretation radiologist agrees    PROCEDURES:  Critical Care performed: No  Procedures   MEDICATIONS ORDERED IN ED: Medications  furosemide  (LASIX ) injection 40 mg (40 mg Intravenous Given 05/21/24 2332)     IMPRESSION / MDM / ASSESSMENT AND PLAN / ED COURSE                                Differential diagnosis includes, but is not limited to, CHF exacerbation, pneumonia, anemia, sepsis, UTI, electrolyte derangement, atypical ACS  ED course: Patient arrives and is poor appearing initially.  EKG demonstrated no evidence of acute ischemia.  History was very difficult to obtain on first exam and there was concern that the patient was having fevers at home.  Likely she does not have an elevated leukocytosis and her lactic acid is not elevated.  She has an extremely elevated BNP of 1700 today when her baseline appears to be around 800.  Troponin is elevated as well but suspect this is secondary due to BNP.  She is on torsemide I have ordered her small dose of Lasix .  She is not hypoxic to but does appear hypervolemic.  Case discussed with hospitalist for continued diuresis   Clinical Course as of 05/21/24 2338  Thu May 21, 2024  2229 WBC: 6.5 No leukocytosis [HD]  2229 HCT(!): 28.5 This is baseline for her [HD]  2306 B Natriuretic Peptide(!): 1,736.3 [HD]  2328 Case discussed with hospitalist for admission [HD]  2328 DG Chest Portable 1 View Consistent with increased pulmonary edema [HD]    Clinical Course User Index [HD] Nicholaus Rolland BRAVO, MD     FINAL CLINICAL IMPRESSION(S) / ED DIAGNOSES   Final diagnoses:   Acute on chronic congestive heart failure, unspecified heart failure type (HCC)  Hypervolemia, unspecified hypervolemia type     Rx / DC Orders   ED Discharge Orders     None        Note:  This document was prepared using Dragon voice recognition software and may include unintentional dictation errors.  Nicholaus Rolland BRAVO, MD 05/21/24 [phone]

## 2024-05-22 ENCOUNTER — Telehealth (HOSPITAL_COMMUNITY): Payer: Self-pay | Admitting: Pharmacy Technician

## 2024-05-22 ENCOUNTER — Inpatient Hospital Stay: Admit: 2024-05-22 | Discharge: 2024-05-22 | Disposition: A | Discharge status: Home / Self Care

## 2024-05-22 ENCOUNTER — Other Ambulatory Visit (HOSPITAL_COMMUNITY): Payer: Self-pay

## 2024-05-22 DIAGNOSIS — I5033 Acute on chronic diastolic (congestive) heart failure: Secondary | ICD-10-CM | POA: Diagnosis not present

## 2024-05-22 DIAGNOSIS — I10 Essential (primary) hypertension: Secondary | ICD-10-CM | POA: Insufficient documentation

## 2024-05-22 DIAGNOSIS — E1142 Type 2 diabetes mellitus with diabetic polyneuropathy: Secondary | ICD-10-CM | POA: Insufficient documentation

## 2024-05-22 DIAGNOSIS — K219 Gastro-esophageal reflux disease without esophagitis: Secondary | ICD-10-CM | POA: Insufficient documentation

## 2024-05-22 LAB — CBC
HCT: 28.7 % — ABNORMAL LOW (ref 36.0–46.0)
Hemoglobin: 8.8 g/dL — ABNORMAL LOW (ref 12.0–15.0)
MCH: 24.2 pg — ABNORMAL LOW (ref 26.0–34.0)
MCHC: 30.7 g/dL (ref 30.0–36.0)
MCV: 78.8 fL — ABNORMAL LOW (ref 80.0–100.0)
Platelets: 157 K/uL (ref 150–400)
RBC: 3.64 MIL/uL — ABNORMAL LOW (ref 3.87–5.11)
RDW: 15.9 % — ABNORMAL HIGH (ref 11.5–15.5)
WBC: 6.4 K/uL (ref 4.0–10.5)
nRBC: 0 % (ref 0.0–0.2)

## 2024-05-22 LAB — URINALYSIS, ROUTINE W REFLEX MICROSCOPIC
Bacteria, UA: NONE SEEN
Bilirubin Urine: NEGATIVE
Glucose, UA: NEGATIVE mg/dL
Ketones, ur: NEGATIVE mg/dL
Nitrite: NEGATIVE
Protein, ur: 30 mg/dL — AB
Specific Gravity, Urine: 1.012 (ref 1.005–1.030)
pH: 6 (ref 5.0–8.0)

## 2024-05-22 LAB — BASIC METABOLIC PANEL WITH GFR
Anion gap: 9 (ref 5–15)
BUN: 11 mg/dL (ref 8–23)
CO2: 25 mmol/L (ref 22–32)
Calcium: 8.8 mg/dL — ABNORMAL LOW (ref 8.9–10.3)
Chloride: 95 mmol/L — ABNORMAL LOW (ref 98–111)
Creatinine, Ser: 0.82 mg/dL (ref 0.44–1.00)
GFR, Estimated: >60 mL/min (ref >60)
Glucose, Bld: 131 mg/dL — ABNORMAL HIGH (ref 70–99)
Potassium: 4.2 mmol/L (ref 3.5–5.1)
Sodium: 129 mmol/L — ABNORMAL LOW (ref 135–145)

## 2024-05-22 LAB — TROPONIN I (HIGH SENSITIVITY): Troponin I (High Sensitivity): 60 ng/L — ABNORMAL HIGH (ref <18)

## 2024-05-22 MED ORDER — ACETAMINOPHEN 325 MG PO TABS
650.0000 mg | ORAL_TABLET | Freq: Four times a day (QID) | ORAL | Status: DC | PRN
  Administered 2024-05-22 – 2024-05-27 (×4): 650 mg via ORAL
  Filled 2024-05-22 (×4): qty 2

## 2024-05-22 MED ORDER — PANTOPRAZOLE SODIUM 40 MG PO TBEC
40.0000 mg | DELAYED_RELEASE_TABLET | Freq: Two times a day (BID) | ORAL | Status: DC
  Administered 2024-05-22 – 2024-05-28 (×13): 40 mg via ORAL
  Filled 2024-05-22 (×14): qty 1

## 2024-05-22 MED ORDER — METOPROLOL SUCCINATE ER 25 MG PO TB24
12.5000 mg | ORAL_TABLET | Freq: Every day | ORAL | Status: DC
  Administered 2024-05-22 – 2024-05-24 (×3): 12.5 mg via ORAL
  Filled 2024-05-22 (×3): qty 1

## 2024-05-22 MED ORDER — POTASSIUM CHLORIDE CRYS ER 10 MEQ PO TBCR
10.0000 meq | EXTENDED_RELEASE_TABLET | Freq: Two times a day (BID) | ORAL | Status: DC
  Administered 2024-05-22 – 2024-05-23 (×4): 10 meq via ORAL
  Filled 2024-05-22 (×4): qty 1

## 2024-05-22 MED ORDER — LISINOPRIL 10 MG PO TABS: 20.0000 mg | ORAL_TABLET | Freq: Every day | ORAL | Status: DC

## 2024-05-22 MED ORDER — LOSARTAN POTASSIUM 50 MG PO TABS
50.0000 mg | ORAL_TABLET | Freq: Every day | ORAL | Status: DC
  Administered 2024-05-22 – 2024-05-28 (×7): 50 mg via ORAL
  Filled 2024-05-22 (×7): qty 1

## 2024-05-22 MED ORDER — IPRATROPIUM BROMIDE 0.02 % IN SOLN
0.5000 mg | RESPIRATORY_TRACT | Status: DC | PRN
  Administered 2024-05-24: 0.5 mg via RESPIRATORY_TRACT
  Filled 2024-05-22: qty 2.5

## 2024-05-22 MED ORDER — FUROSEMIDE 10 MG/ML IJ SOLN: 40.0000 mg | Freq: Two times a day (BID) | INTRAMUSCULAR | Status: DC

## 2024-05-22 MED ORDER — ENOXAPARIN SODIUM 60 MG/0.6ML IJ SOSY
50.0000 mg | PREFILLED_SYRINGE | INTRAMUSCULAR | Status: DC
  Administered 2024-05-22 – 2024-05-27 (×6): 50 mg via SUBCUTANEOUS
  Filled 2024-05-22 (×6): qty 0.6

## 2024-05-22 MED ORDER — ONDANSETRON HCL 4 MG PO TABS: 4.0000 mg | ORAL_TABLET | Freq: Four times a day (QID) | ORAL | Status: DC | PRN

## 2024-05-22 MED ORDER — FUROSEMIDE 10 MG/ML IJ SOLN
80.0000 mg | Freq: Two times a day (BID) | INTRAMUSCULAR | Status: DC
  Administered 2024-05-22 – 2024-05-24 (×6): 80 mg via INTRAVENOUS
  Filled 2024-05-22 (×6): qty 8

## 2024-05-22 MED ORDER — AMLODIPINE BESYLATE 10 MG PO TABS
10.0000 mg | ORAL_TABLET | Freq: Every day | ORAL | Status: DC
  Administered 2024-05-22 – 2024-05-24 (×3): 10 mg via ORAL
  Filled 2024-05-22 (×2): qty 2
  Filled 2024-05-22: qty 1

## 2024-05-22 MED ORDER — ACETAMINOPHEN 650 MG RE SUPP: 650.0000 mg | Freq: Four times a day (QID) | RECTAL | Status: DC | PRN

## 2024-05-22 MED ORDER — FUROSEMIDE 10 MG/ML IJ SOLN: 60.0000 mg | Freq: Once | INTRAMUSCULAR | Status: DC

## 2024-05-22 MED ORDER — HYDROCHLOROTHIAZIDE 12.5 MG PO CAPS
12.5000 mg | ORAL_CAPSULE | Freq: Every day | ORAL | Status: DC
  Filled 2024-05-22: qty 1

## 2024-05-22 MED ORDER — VITAMIN D (ERGOCALCIFEROL) 1.25 MG (50000 UNIT) PO CAPS
50000.0000 [IU] | ORAL_CAPSULE | ORAL | Status: DC
  Administered 2024-05-22: 50000 [IU] via ORAL
  Filled 2024-05-22: qty 1

## 2024-05-22 MED ORDER — METOPROLOL SUCCINATE ER 50 MG PO TB24: 50.0000 mg | ORAL_TABLET | Freq: Every day | ORAL | Status: DC

## 2024-05-22 MED ORDER — ONDANSETRON HCL 4 MG/2ML IJ SOLN
4.0000 mg | Freq: Four times a day (QID) | INTRAMUSCULAR | Status: DC | PRN
Start: 2024-05-22 — End: 2024-05-28
  Administered 2024-05-26: 4 mg via INTRAVENOUS
  Filled 2024-05-22: qty 2

## 2024-05-22 MED ORDER — MAGNESIUM HYDROXIDE 400 MG/5ML PO SUSP
30.0000 mL | Freq: Every day | ORAL | Status: DC | PRN
  Administered 2024-05-28: 30 mL via ORAL
  Filled 2024-05-22: qty 30

## 2024-05-22 MED ORDER — BENZONATATE 100 MG PO CAPS: 200.0000 mg | ORAL_CAPSULE | Freq: Three times a day (TID) | ORAL | Status: DC | PRN

## 2024-05-22 MED ORDER — ATORVASTATIN CALCIUM 20 MG PO TABS
40.0000 mg | ORAL_TABLET | Freq: Every day | ORAL | Status: DC
  Administered 2024-05-22 – 2024-05-28 (×7): 40 mg via ORAL
  Filled 2024-05-22 (×7): qty 2

## 2024-05-22 MED ORDER — TRAZODONE HCL 50 MG PO TABS: 25.0000 mg | ORAL_TABLET | Freq: Every evening | ORAL | Status: DC | PRN

## 2024-05-22 NOTE — Assessment & Plan Note (Signed)
-   The patient will be placed on supplemental coverage with NovoLog. - Will continue Neurontin.

## 2024-05-22 NOTE — Telephone Encounter (Signed)
 Patient Product/process development scientist completed.    The patient is insured through [employer]. Patient has Medicare and is not eligible for a copay card, but may be able to apply for patient assistance or Medicare RX Payment Plan (Patient Must reach out to their plan, if eligible for payment plan), if available.    Ran test claim for Farxiga  10 mg and the current 30 day co-pay is $0.00.   This test claim was processed through [facility]- copay amounts may vary at other pharmacies due to pharmacy/plan contracts, or as the patient moves through the different stages of their insurance plan.     Reyes Sharps, CPHT Pharmacy Technician III Certified Patient Advocate [facility] Pharm[facility] Patient Advocate Team Direct Number: [phone]  Fax: [phone]

## 2024-05-22 NOTE — Assessment & Plan Note (Signed)
-   Will continue antihypertensive therapy.

## 2024-05-22 NOTE — Progress Notes (Signed)
 PHARMACIST - PHYSICIAN COMMUNICATION  CONCERNING:  Enoxaparin  (Lovenox ) for DVT Prophylaxis    RECOMMENDATION: Patient was prescribed enoxaprin 40mg  q24 hours for VTE prophylaxis.   Filed Weights   05/21/24 2204  Weight: 99.4 kg (219 lb 2.2 oz)    Body mass index is 37.61 kg/m.  Estimated Creatinine Clearance: 62.1 mL/min (by C-G formula based on SCr of 0.8 mg/dL).   Based on [facility] policy patient is candidate for enoxaparin  0.5mg /kg TBW SQ every 24 hours based on BMI being >30.  DESCRIPTION: Pharmacy has adjusted enoxaparin  dose per [facility] policy.  Patient is now receiving enoxaparin  0.5 mg/kg every 24 hours   Rankin CANDIE Dills, PharmD, [facility] 05/22/2024 12:18 AM

## 2024-05-22 NOTE — Assessment & Plan Note (Addendum)
-  The patient will be admitted to a cardiac telemetry bed. - We will continue diuresis with IV Lasix . - We Will follow serial troponins. - We will follow I's and O's and daily weights. - Cardiology consult be obtained. - I notified Dr. Florencio about the patient. The-the patient had a 2D echo last year revealing an EF of 55 to 60% with grade 2 diastolic dysfunction, mild aortic stenosis with mild calcification of the aortic valve and mild aortic regurgitation.

## 2024-05-22 NOTE — Progress Notes (Signed)
 Heart Failure Navigator Progress Note  Assessed for Heart & Vascular TOC clinic readiness.  Patient does not meet criteria due to current consult with [facility], Dr Florencio.  Navigator will sign off at this time.  Charmaine Pines, RN, BSN [facility] Heart Failure Navigator Secure Chat Only

## 2024-05-22 NOTE — ED Notes (Signed)
 Pt was able to stand and pivot with x2 person assist to bedside commode. Pt had one formed BM and urinated. Peri care provided, new brief and pure wick. Pt returned to bed without incident. CB within reach. No other needs expressed.

## 2024-05-22 NOTE — Progress Notes (Signed)
 Progress Note   Patient: Kendra Clarke FMW:[id number] DOB: 12/25/1940 DOA: 05/21/2024     1 DOS: the patient was seen and examined on 05/22/2024   Brief hospital course: Kendra Clarke is a 83 y.o. female with medical history significant for COPD, asthma, osteoarthritis, type 2 diabetes mellitus and hypertension, presented to the emergency room with acute onset of worsening dyspnea with associated 4 pillow orthopnea and paroxysmal nocturnal dyspnea.  She admitted to worsening lower extremity edema as well as dyspnea on exertion lately.  No fever or chills.  She has been having dry cough and wheezing.  No dysuria, oliguria or hematuria or flank pain.  No fever or chills.  No nausea or vomiting or abdominal pain.  No bleeding diathesis.   Assessment and Plan: * Acute on chronic diastolic CHF (congestive heart failure) (HCC) TTE 2024  LVEF of 55 to 60% with grade 2 diastolic dysfunction, mild aortic stenosis with mild calcification of the aortic valve and mild aortic regurgitation. On torsemide at home, lisinopril , and hydrochlorothiazide , and amlodipine  Patient reports she has difficulty getting her medications when she runs out. Nephew who was previously helping her had a recent stroke. Her son is in the [employer] per patient and can not help.  She continues to have some dyspnea after IV lasix  40mg .  -Per cardiology IV lasix  dose increased, transitioned to metop succinate and losartan  -F/u TTE -Strict I/Os, daily weights   GERD without esophagitis - Will continue PPI therapy.  Type 2 diabetes mellitus with peripheral neuropathy (HCC) SSI Continue neurontin   COPD Ashtma Continue prn ipratropium   Microcytic anemia  Check iron  panel  Hyponatremia  IN the setting of hypervolemia  CTM with diuresis   Essential hypertension Per above    OA  PRN analgesia      Subjective: Feels dyspnea is about the same. No chest pain.   Physical Exam: Vitals:   05/22/24 1500 05/22/24 1530  05/22/24 1545 05/22/24 1630  BP: (!) 141/70 139/69  (!) 151/70  Pulse:    100  Resp: (!) 23  19 19   Temp:    98.3 F (36.8 C)  TempSrc:    Oral  SpO2:    100%  Weight:      Height:       Physical Exam  Constitutional: In no distress.  Cardiovascular: Normal rate, regular rhythm. 2+ bilateral lower extremity edema to hips. JVP elevated  Pulmonary: Non labored breathing on [place], mild wheezing, no rales.   Abdominal: Soft. Non distended and non tender Neurological: Alert and oriented to person, place, and time. Non focal  Skin: Skin is warm and dry.   Data Reviewed:     Latest Ref Rng & Units 05/22/2024    6:10 AM 05/21/2024   10:11 PM 08/28/2023    9:55 AM  BMP  Glucose 70 - 99 mg/dL 868  855  882   BUN 8 - 23 mg/dL 11  10  23    Creatinine 0.44 - 1.00 mg/dL 9.17  9.19  8.64   Sodium 135 - 145 mmol/L 129  130  135   Potassium 3.5 - 5.1 mmol/L 4.2  4.8  3.1   Chloride 98 - 111 mmol/L 95  95  96   CO2 22 - 32 mmol/L 25  23  27    Calcium  8.9 - 10.3 mg/dL 8.8  9.2  8.3       Latest Ref Rng & Units 05/22/2024    6:10 AM 05/21/2024  10:11 PM 08/28/2023    9:55 AM  CBC  WBC 4.0 - 10.5 K/uL 6.4  6.5  4.6   Hemoglobin 12.0 - 15.0 g/dL 8.8  8.7  8.8   Hematocrit 36.0 - 46.0 % 28.7  28.5  28.2   Platelets 150 - 400 K/uL 157  169  147      Family Communication: None at bedside   Disposition: Status is: Inpatient Remains inpatient appropriate because: IV diuresis  Planned Discharge Destination: Pending     Time spent: 35 minutes  Author: Alban Pepper, MD 05/22/2024 4:52 PM  For on call review [email or web address].

## 2024-05-22 NOTE — Consult Note (Addendum)
 [name] CLINIC CARDIOLOGY CONSULT NOTE       [name] MRN: [id number] DOB/AGE: 12-12-1940 83 y.o.  Admit date: 05/21/2024 Referring Physician Dr. Lawence Primary Physician Kendra Handing, PA Primary Cardiologist None Reason for Consultation CHF  HPI: Kendra Clarke is a 83 y.o. female  with a past medical history of hypertension, type 2 diabetes, COPD, asthma, osteoarthritis who presented to the ED on 05/21/2024 for worsening dyspnea, lower extremity swelling and 4 pillow orthopnea.  Patient diagnosed with congestive heart failure in 2023 by [facility], however has not seen a cardiologist. Cardiology was consulted for further evaluation.   Work up in the ED notable for Na 130, K 4.8, Cr 0.80, Hgb 8.7, plts 169. Lactic acid within normal limits. Blood cultures collected and pending x2. BNP elevated at 1700. CXR with cardiomegaly and pulmonary vascular congestions. EKG with sinus tachycardia, rate 113 bpm with RBBB, non-specific ST-T wave changes (similar to prior EKG). Troponins minimally elevated and flat 60 > 60. Patient has received 1x IV lasix  40 mg.   At the time of my evaluation this AM, patient was resting in ED stretcher with mild distress.  We discussed patient's symptoms in further detail.  Patient states she has been having worsening shortness of breath, lower extremity edema and orthopnea for the past few days.  Patient denies decreased appetite or chest pain/pressure.  Patient states she was diagnosed with congestive heart failure at [facility] about 2 years ago.  Patient states that she never followed up with the cardiologist and no one has been managing her heart failure.  Patient states been taking her medications as prescribed including torsemide 40 mg daily.  Patient has received only 1 dose of IV Lasix  40 mg and states she has not had a great urine output and not much improvement to SOB.   Review of systems complete and found to be negative unless listed above    Past  Medical History:  Diagnosis Date   Arthritis    Asthma    COPD (chronic obstructive pulmonary disease) (HCC)    Diabetes mellitus without complication (HCC)    Hypertension    Liver disease    Umbilical hernia     Past Surgical History:  Procedure Laterality Date   ABDOMINAL HYSTERECTOMY     BLADDER SURGERY     CHOLECYSTECTOMY     ESOPHAGOGASTRODUODENOSCOPY N/A 09/03/2023   Procedure: ESOPHAGOGASTRODUODENOSCOPY (EGD);  Surgeon: Onita Elspeth Sharper, DO;  Location: [facility] ENDOSCOPY;  Service: Gastroenterology;  Laterality: N/A;   nose surgery      (Not in a hospital admission)  Social History   Socioeconomic History   Marital status: Widowed    Spouse name: Not on file   Number of children: Not on file   Years of education: Not on file   Highest education level: Not on file  Occupational History   Not on file  Tobacco Use   Smoking status: Never   Smokeless tobacco: Never  Vaping Use   Vaping status: Never Used  Substance and Sexual Activity   Alcohol use: No   Drug use: No   Sexual activity: Not on file  Other Topics Concern   Not on file  Social History Narrative   Not on file   Social Drivers of Health   Financial Resource Strain: Low Risk  (09/09/2023)   Received from [facility]   Overall Financial Resource Strain (CARDIA)    Difficulty of Paying Living Expenses: Not very hard  Food Insecurity: No Food Insecurity (09/09/2023)   Received from [facility]   Hunger Vital Sign    Within the past 12 months, you worried that your food would run out before you got the money to buy more.: Never true    Within the past 12 months, the food you bought just didn't last and you didn't have money to get more.: Never true  Transportation Needs: No Transportation Needs (09/09/2023)   Received from [facility] - Transportation    In the past 12 months, has lack of transportation kept you from medical  appointments or from getting medications?: No    Lack of Transportation (Non-Medical): No  Physical Activity: Not on file  Stress: Not on file  Social Connections: Not on file  Intimate Partner Violence: Not At Risk (08/27/2023)   Humiliation, Afraid, Rape, and Kick questionnaire    Fear of Current or Ex-Partner: No    Emotionally Abused: No    Physically Abused: No    Sexually Abused: No    Family History  Problem Relation Age of Onset   Multiple sclerosis Mother    Hypertension Father    COPD Father    Varicose Veins Maternal Aunt    Stroke Nephew      Vitals:   05/22/24 0017 05/22/24 0213 05/22/24 0422 05/22/24 0423  BP:  (!) 172/91    Pulse:  (!) 109 (!) 110 (!) 112  Resp:  (!) 22 (!) 22 20  Temp:  98.5 F (36.9 C)    TempSrc:  Oral    SpO2:  99% 100% 100%  Weight:      Height: 5' 4 (1.626 m)       PHYSICAL EXAM General: Chronically ill-appearing elderly female, well nourished, in no acute distress. HEENT: Normocephalic and atraumatic. Neck: No JVD.   Lungs: Normal respiratory effort on [place].  Bibasilar crackles with diminished breath sounds. Heart: HRR, elevated rate. Normal S1 and S2 without gallops or murmurs.  Abdomen: Non-distended appearing.  Msk: Normal strength and tone for age. Extremities: Warm and well perfused. No clubbing, cyanosis. 3+ pitting edema.  Neuro: Alert and oriented X 3. Psych: Answers questions appropriately.   Labs: Basic Metabolic Panel: Recent Labs    05/21/24 2211 05/22/24 0610  NA 130* 129*  K 4.8 4.2  CL 95* 95*  CO2 23 25  GLUCOSE 144* 131*  BUN 10 11  CREATININE 0.80 0.82  CALCIUM  9.2 8.8*   Liver Function Tests: Recent Labs    05/21/24 2211  AST 47*  ALT 23  ALKPHOS 62  BILITOT 1.2  PROT 6.6  ALBUMIN  3.3*   Recent Labs    05/21/24 2211  LIPASE 26   CBC: Recent Labs    05/21/24 2211 05/22/24 0610  WBC 6.5 6.4  NEUTROABS 5.6  --   HGB 8.7* 8.8*  HCT 28.5* 28.7*  MCV 78.5* 78.8*  PLT 169 157    Cardiac Enzymes: Recent Labs    05/21/24 2211 05/22/24 0001  TROPONINIHS 60* 60*   BNP: Recent Labs    05/21/24 2212  BNP 1,736.3*   D-Dimer: No results for input(s): DDIMER in the last 72 hours. Hemoglobin A1C: No results for input(s): HGBA1C in the last 72 hours. Fasting Lipid Panel: No results for input(s): CHOL, HDL, LDLCALC, TRIG, CHOLHDL, LDLDIRECT in the last 72 hours. Thyroid Function Tests: No results for input(s): TSH, T4TOTAL, T3FREE, THYROIDAB in the last 72 hours.  Invalid input(s): FREET3  Anemia Panel: No results for input(s): VITAMINB12, FOLATE, FERRITIN, TIBC, IRON , RETICCTPCT in the last 72 hours.   Radiology: DG Chest Portable 1 View Result Date: 05/21/2024 CLINICAL DATA:  Shortness of breath EXAM: PORTABLE CHEST 1 VIEW COMPARISON:  Chest x-ray 08/27/2023 FINDINGS: The heart is enlarged. There are increased central interstitial markings bilaterally. There is a cluster of micro nodules in the right upper lobe. There is atelectatic change in both lung bases. There is no pleural effusion or pneumothorax. No acute fractures are seen. IMPRESSION: 1. Cardiomegaly with increased central interstitial markings bilaterally, likely pulmonary edema. 2. Cluster of micro nodules in the right upper lobe, likely infectious or inflammatory. Electronically Signed   By: Greig Pique M.D.   On: 05/21/2024 22:44    ECHO ordered  TELEMETRY reviewed by me 05/22/2024: Sinus tachycardia, rate 110s with frequent PVCs  EKG reviewed by me: sinus tachycardia, rate 113 bpm with RBBB, non-specific ST-T wave changes (similar to prior EKG).  Data reviewed by me 05/22/2024: last 24h vitals tele labs imaging I/O ED provider note, admission H&P.  Principal Problem:   Acute on chronic diastolic CHF (congestive heart failure) (HCC)    ASSESSMENT AND PLAN:  LARKYN GREENBERGER is a 83 y.o. female  with a past medical history of hypertension, type 2 diabetes,  COPD, asthma, osteoarthritis who presented to the ED on 05/21/2024 for worsening dyspnea, lower extremity swelling and 4 pillow orthopnea.  Patient diagnosed with congestive heart failure in 2023 by [facility], however has not seen a cardiologist. Cardiology was consulted for further evaluation.   # Acute on chronic HFpEF # COPD BNP elevated at 1700. CXR with cardiomegaly and pulmonary vascular congestions. - Echo ordered.  Further recommendations pending results. - Monitor and replenish electrolytes for a goal K >4, Mag >2  - Increased IV Lasix  to 80 mg twice daily. (Home doses torsemide 40 mg daily.) - Closely monitor UOP and renal function. - Will plan to start dapagliflozin for GDMT optimization tomorrow. (Copay $0)  # Hypertension # Hyperlipidemia Patient without chest pain.  EKG with non-specific ST-T wave changes (similar to prior EKG). Troponins minimally elevated and flat 60 > 60. -Lipid panel ordered. -Continue home amlodipine  10 mg daily. -Order losartan  50 mg daily. -Ordered low dose metoprolol  succinate 12.5 mg.  Will uptitrate when patient is closer to euvolemia. -Ordered atorvastatin  40 mg daily. -Minimally elevated and flat is most consistent with demand/supply mismatch and not ACS.  This patient's plan of care was discussed and created with Dr. Florencio and he is in agreement.  Signed: Dorene Comfort, PA-C  05/22/2024, 7:20 AM [facility] Card[facility]

## 2024-05-22 NOTE — Assessment & Plan Note (Signed)
-   Will continue PPI therapy.

## 2024-05-22 NOTE — Assessment & Plan Note (Signed)
-   Will continue Neurontin .

## 2024-05-23 ENCOUNTER — Inpatient Hospital Stay

## 2024-05-23 DIAGNOSIS — I5033 Acute on chronic diastolic (congestive) heart failure: Secondary | ICD-10-CM | POA: Diagnosis not present

## 2024-05-23 LAB — LIPID PANEL
Cholesterol: 141 mg/dL (ref 0–200)
HDL: 49 mg/dL (ref >40)
LDL Cholesterol: 80 mg/dL (ref 0–99)
Total CHOL/HDL Ratio: 2.9 ratio
Triglycerides: 59 mg/dL (ref <150)
VLDL: 12 mg/dL (ref 0–40)

## 2024-05-23 LAB — CBC
HCT: 27.5 % — ABNORMAL LOW (ref 36.0–46.0)
Hemoglobin: 8.4 g/dL — ABNORMAL LOW (ref 12.0–15.0)
MCH: 23.5 pg — ABNORMAL LOW (ref 26.0–34.0)
MCHC: 30.5 g/dL (ref 30.0–36.0)
MCV: 77 fL — ABNORMAL LOW (ref 80.0–100.0)
Platelets: 135 K/uL — ABNORMAL LOW (ref 150–400)
RBC: 3.57 MIL/uL — ABNORMAL LOW (ref 3.87–5.11)
RDW: 15.9 % — ABNORMAL HIGH (ref 11.5–15.5)
WBC: 5.6 K/uL (ref 4.0–10.5)
nRBC: 0 % (ref 0.0–0.2)

## 2024-05-23 LAB — IRON AND TIBC
Iron: 24 ug/dL — ABNORMAL LOW (ref 28–170)
Saturation Ratios: 6 % — ABNORMAL LOW (ref 10.4–31.8)
TIBC: 420 ug/dL (ref 250–450)
UIBC: 396 ug/dL

## 2024-05-23 LAB — BASIC METABOLIC PANEL WITH GFR
Anion gap: 9 (ref 5–15)
BUN: 13 mg/dL (ref 8–23)
CO2: 29 mmol/L (ref 22–32)
Calcium: 8.1 mg/dL — ABNORMAL LOW (ref 8.9–10.3)
Chloride: 90 mmol/L — ABNORMAL LOW (ref 98–111)
Creatinine, Ser: 0.85 mg/dL (ref 0.44–1.00)
GFR, Estimated: >60 mL/min (ref >60)
Glucose, Bld: 114 mg/dL — ABNORMAL HIGH (ref 70–99)
Potassium: 3.5 mmol/L (ref 3.5–5.1)
Sodium: 128 mmol/L — ABNORMAL LOW (ref 135–145)

## 2024-05-23 LAB — MAGNESIUM: Magnesium: 1.2 mg/dL — ABNORMAL LOW (ref 1.7–2.4)

## 2024-05-23 LAB — FERRITIN: Ferritin: 14 ng/mL (ref 11–307)

## 2024-05-23 MED ORDER — MAGNESIUM SULFATE 50 % IJ SOLN
2.0000 g | Freq: Once | INTRAMUSCULAR | Status: AC
  Administered 2024-05-23: 2 g via INTRAVENOUS
  Filled 2024-05-23: qty 4

## 2024-05-23 MED ORDER — MAGNESIUM SULFATE 2 GM/50ML IV SOLN
2.0000 g | Freq: Once | INTRAVENOUS | Status: AC
  Administered 2024-05-23: 2 g via INTRAVENOUS
  Filled 2024-05-23: qty 50

## 2024-05-23 MED ORDER — FERROUS SULFATE 325 (65 FE) MG PO TABS
325.0000 mg | ORAL_TABLET | Freq: Every day | ORAL | Status: DC
  Administered 2024-05-24 – 2024-05-28 (×5): 325 mg via ORAL
  Filled 2024-05-23 (×5): qty 1

## 2024-05-23 NOTE — Progress Notes (Signed)
 Progress Note   Patient: Kendra Clarke FMW:[id number] DOB: 26-Jan-1941 DOA: 05/21/2024     2 DOS: the patient was seen and examined on 05/23/2024   Brief hospital course: Kendra Clarke is a 83 y.o. female with medical history significant for COPD, asthma, osteoarthritis, type 2 diabetes mellitus and hypertension, presented to the emergency room with acute onset of worsening dyspnea with associated 4 pillow orthopnea and paroxysmal nocturnal dyspnea.  She admitted to worsening lower extremity edema as well as dyspnea on exertion lately.  No fever or chills.  She has been having dry cough and wheezing.  No dysuria, oliguria or hematuria or flank pain.  No fever or chills.  No nausea or vomiting or abdominal pain.  No bleeding diathesis.   Assessment and Plan: * Acute on chronic diastolic CHF (congestive heart failure) (HCC) TTE 2024  LVEF of 55 to 60% with grade 2 diastolic dysfunction, mild aortic stenosis with mild calcification of the aortic valve and mild aortic regurgitation. On torsemide  at home, lisinopril , and hydrochlorothiazide , and amlodipine  Patient reports she has difficulty getting her medications when she runs out. Nephew who was previously helping her had a recent stroke. Her son is in the [employer] per patient and can not help.  Dyspnea improved continues to have orthopnea.  -Per cardiology IV lasix  , transitioned to metop succinate and losartan  -F/u TTE -Strict I/Os, daily weights  -Unna boots once ABIs back   GERD without esophagitis - Will continue PPI therapy.  Type 2 diabetes mellitus with peripheral neuropathy (HCC) Well controlled.  Continue neurontin   COPD Ashtma Continue prn ipratropium   Microcytic anemia  Iron /TIBC/Ferritin/ %Sat    Component Value Date/Time   IRON  24 (L) 05/23/2024 0545   TIBC 420 05/23/2024 0545   FERRITIN 14 05/23/2024 0545   IRONPCTSAT 6 (L) 05/23/2024 0545  Hgb stable.   Supplement iron  with IV venofer  and PO    Hyponatremia  IN  the setting of hypervolemia  CTM with diuresis   Hypomag Supplement   Essential hypertension Per above    OA  PRN analgesia      Subjective: SOB is improved. Eager to get home.   Physical Exam: Vitals:   05/23/24 1430 05/23/24 1500 05/23/24 1530 05/23/24 1723  BP: 131/62 124/63 134/63 (!) 142/68  Pulse: 89 83 90 90  Resp: (!) 23 16 20 19   Temp:   98.4 F (36.9 C) 98.6 F (37 C)  TempSrc:   Oral Oral  SpO2: 95% 97% 97% 98%  Weight:      Height:       Physical Exam  Constitutional: In no distress.  Cardiovascular: Normal rate, regular rhythm. 2+ bilateral lower extremity edema to hips. No elevated JVP Pulmonary: Non labored breathing on room air, no wheezing or rales.   Abdominal: Soft. Non distended and non tender Musculoskeletal: Normal range of motion.     Neurological: Alert and oriented to person, place, and time. Non focal  Skin: Skin is warm and dry.    Data Reviewed:     Latest Ref Rng & Units 05/23/2024    5:45 AM 05/22/2024    6:10 AM 05/21/2024   10:11 PM  BMP  Glucose 70 - 99 mg/dL 885  868  855   BUN 8 - 23 mg/dL 13  11  10    Creatinine 0.44 - 1.00 mg/dL 9.14  9.17  9.19   Sodium 135 - 145 mmol/L 128  129  130   Potassium 3.5 -  5.1 mmol/L 3.5  4.2  4.8   Chloride 98 - 111 mmol/L 90  95  95   CO2 22 - 32 mmol/L 29  25  23    Calcium  8.9 - 10.3 mg/dL 8.1  8.8  9.2       Latest Ref Rng & Units 05/23/2024    5:45 AM 05/22/2024    6:10 AM 05/21/2024   10:11 PM  CBC  WBC 4.0 - 10.5 K/uL 5.6  6.4  6.5   Hemoglobin 12.0 - 15.0 g/dL 8.4  8.8  8.7   Hematocrit 36.0 - 46.0 % 27.5  28.7  28.5   Platelets 150 - 400 K/uL 135  157  169      Family Communication: None at bedside   Disposition: Status is: Inpatient Remains inpatient appropriate because: IV diuresis  Planned Discharge Destination: Pending     Time spent: 35 minutes  Author: Alban Pepper, MD 05/23/2024 7:05 PM  For on call review [email or web address].

## 2024-05-23 NOTE — ED Notes (Signed)
 Pt off monitor on toilet

## 2024-05-23 NOTE — Progress Notes (Addendum)
 Called and spoke with patient's son Camellia. RN asked son if patient gets confused at times. Camellia stated no. RN informed son that patient keeps saying that she is supposed to be going home not be admitted and that a doctor told her earlier she would be going home. Patient's son stated she called me earlier and said the same thing but I spoke with the nurse in the ED and they said it is not true. Patient son stated she just wants to get out of there and come home and is trying everything to leave. RN informed son that all of patient's conversations make sense and that she answers all other questions appropriately other than she states she is supposed to be getting out of here.

## 2024-05-23 NOTE — TOC CM/SW Note (Signed)
..  Transition of Care [facility]) - Inpatient Brief Assessment   Patient Details  Name: Kendra Clarke MRN: [id number] Date of Birth: Sep 14, 1941  Transition of Care [facility] CM/SW Contact:    Edsel DELENA Fischer, LCSW Phone Number: 05/23/2024, 8:40 AM   Clinical Narrative:  TOC to handoff to heart failure team to follow up  Transition of Care Asessment:

## 2024-05-23 NOTE — Progress Notes (Addendum)
 Sent secure chat to Dr. Franchot to inform him that patient has a red rash to abdominal folds, groin and peri area. Waiting for MD to acknowledge.

## 2024-05-23 NOTE — Progress Notes (Addendum)
 SUBJECTIVE: Patient is feeling much better denies any shortness of breath at this time.   Vitals:   05/23/24 0620 05/23/24 0900 05/23/24 1000 05/23/24 1010  BP:    (!) 141/77  Pulse:  95 100 96  Resp:   (!) 23 20  Temp: 98.2 F (36.8 C)   98.3 F (36.8 C)  TempSrc: Oral   Oral  SpO2:  100% 100% 100%  Weight:      Height:        Intake/Output Summary (Last 24 hours) at 05/23/2024 1023 Last data filed at 05/22/2024 1357 Gross per 24 hour  Intake --  Output 1200 ml  Net -1200 ml    LABS: Basic Metabolic Panel: Recent Labs    05/22/24 0610 05/23/24 0545  NA 129* 128*  K 4.2 3.5  CL 95* 90*  CO2 25 29  GLUCOSE 131* 114*  BUN 11 13  CREATININE 0.82 0.85  CALCIUM  8.8* 8.1*  MG  --  1.2*   Liver Function Tests: Recent Labs    05/21/24 2211  AST 47*  ALT 23  ALKPHOS 62  BILITOT 1.2  PROT 6.6  ALBUMIN  3.3*   Recent Labs    05/21/24 2211  LIPASE 26   CBC: Recent Labs    05/21/24 2211 05/22/24 0610 05/23/24 0545  WBC 6.5 6.4 5.6  NEUTROABS 5.6  --   --   HGB 8.7* 8.8* 8.4*  HCT 28.5* 28.7* 27.5*  MCV 78.5* 78.8* 77.0*  PLT 169 157 135*   Cardiac Enzymes: No results for input(s): CKTOTAL, CKMB, CKMBINDEX, TROPONINI in the last 72 hours. BNP: Invalid input(s): POCBNP D-Dimer: No results for input(s): DDIMER in the last 72 hours. Hemoglobin A1C: No results for input(s): HGBA1C in the last 72 hours. Fasting Lipid Panel: Recent Labs    05/23/24 0545  CHOL 141  HDL 49  LDLCALC 80  TRIG 59  CHOLHDL 2.9   Thyroid Function Tests: No results for input(s): TSH, T4TOTAL, T3FREE, THYROIDAB in the last 72 hours.  Invalid input(s): FREET3 Anemia Panel: Recent Labs    05/23/24 0545  FERRITIN 14  TIBC 420  IRON  24*     PHYSICAL EXAM General: Well developed, well nourished, in no acute distress HEENT:  Normocephalic and atramatic Neck:  No JVD.  Lungs: Clear bilaterally to auscultation and percussion. Heart: HRRR .  Normal S1 and S2 without gallops or murmurs.  Abdomen: Bowel sounds are positive, abdomen soft and non-tender  Msk:  Back normal, normal gait. Normal strength and tone for age. Extremities: No clubbing, cyanosis or edema.   Neuro: Alert and oriented X 3. Psych:  Good affect, responds appropriately  TELEMETRY: Sinus rhythm  ASSESSMENT AND PLAN: #1 acute decompensated heart failure with severe shortness of breath, lower extremity edema, orthopnea and PND.  Patient received Lasix  80 mg every 12 hours and guideline directed medical therapy and is feeling much better this morning.  Waiting for echocardiogram.   #2 mildly elevated troponin most likely due to due to demand ischemia secondary to heart failure.  Patient has not seen cardiologist from [facility] where she normally used to go and needs to be evaluated for ischemic workup.  Patient is very anxious to go home but needs follow-up at [facility] cardiology for possible nuclear stress test. #2 hypomagnesemia, magnesium  level is 1.2 advised 2 g of magnesium  IV.   ICD-10-CM   1. Acute on chronic congestive heart failure, unspecified heart failure type (HCC)  I50.9     2. Hypervolemia,  unspecified hypervolemia type  E87.70       Principal Problem:   Acute on chronic diastolic CHF (congestive heart failure) (HCC) Active Problems:   Essential hypertension   Type 2 diabetes mellitus with peripheral neuropathy (HCC)   GERD without esophagitis    Denyse Bathe, MD, [facility] 05/23/2024 10:23 AM

## 2024-05-23 NOTE — ED Notes (Addendum)
 Pt clean and dry at this time, assisted with water cup, reoriented to situation, bed alarm placed on and pt given call bell. Red rash noted on groin bilaterally, sacrum is intact.

## 2024-05-24 ENCOUNTER — Inpatient Hospital Stay

## 2024-05-24 DIAGNOSIS — I5033 Acute on chronic diastolic (congestive) heart failure: Secondary | ICD-10-CM | POA: Diagnosis not present

## 2024-05-24 LAB — LACTIC ACID, PLASMA: Lactic Acid, Venous: 1 mmol/L (ref 0.5–1.9)

## 2024-05-24 LAB — CBC
HCT: 26.2 % — ABNORMAL LOW (ref 36.0–46.0)
Hemoglobin: 8.4 g/dL — ABNORMAL LOW (ref 12.0–15.0)
MCH: 24.3 pg — ABNORMAL LOW (ref 26.0–34.0)
MCHC: 32.1 g/dL (ref 30.0–36.0)
MCV: 75.9 fL — ABNORMAL LOW (ref 80.0–100.0)
Platelets: 228 10*3/uL (ref 150–400)
RBC: 3.45 MIL/uL — ABNORMAL LOW (ref 3.87–5.11)
RDW: 16.2 % — ABNORMAL HIGH (ref 11.5–15.5)
WBC: 5.5 10*3/uL (ref 4.0–10.5)
nRBC: 0 % (ref 0.0–0.2)

## 2024-05-24 LAB — BASIC METABOLIC PANEL WITH GFR
Anion gap: 8 (ref 5–15)
BUN: 12 mg/dL (ref 8–23)
CO2: 29 mmol/L (ref 22–32)
Calcium: 7.8 mg/dL — ABNORMAL LOW (ref 8.9–10.3)
Chloride: 91 mmol/L — ABNORMAL LOW (ref 98–111)
Creatinine, Ser: 0.85 mg/dL (ref 0.44–1.00)
GFR, Estimated: >60 mL/min (ref >60)
Glucose, Bld: 108 mg/dL — ABNORMAL HIGH (ref 70–99)
Potassium: 3.2 mmol/L — ABNORMAL LOW (ref 3.5–5.1)
Sodium: 128 mmol/L — ABNORMAL LOW (ref 135–145)

## 2024-05-24 LAB — MAGNESIUM: Magnesium: 1.9 mg/dL (ref 1.7–2.4)

## 2024-05-24 MED ORDER — IRON SUCROSE 300 MG IVPB - SIMPLE MED
300.0000 mg | Status: AC
  Administered 2024-05-24 – 2024-05-25 (×2): 300 mg via INTRAVENOUS
  Filled 2024-05-24 (×2): qty 300

## 2024-05-24 MED ORDER — POTASSIUM CHLORIDE 20 MEQ PO PACK
40.0000 meq | PACK | ORAL | Status: AC
  Administered 2024-05-24 (×2): 40 meq via ORAL
  Filled 2024-05-24 (×2): qty 2

## 2024-05-24 MED ORDER — DAPAGLIFLOZIN PROPANEDIOL 10 MG PO TABS
10.0000 mg | ORAL_TABLET | Freq: Every day | ORAL | Status: DC
  Administered 2024-05-24 – 2024-05-28 (×5): 10 mg via ORAL
  Filled 2024-05-24 (×5): qty 1

## 2024-05-24 MED ORDER — SPIRONOLACTONE 25 MG PO TABS
25.0000 mg | ORAL_TABLET | Freq: Every day | ORAL | Status: DC
  Administered 2024-05-25 – 2024-05-28 (×4): 25 mg via ORAL
  Filled 2024-05-24 (×4): qty 1

## 2024-05-24 NOTE — Plan of Care (Signed)
  Problem: Education: Goal: Knowledge of General Education information will improve Description: Including pain rating scale, medication(s)/side effects and non-pharmacologic comfort measures Outcome: Progressing   Problem: Clinical Measurements: Goal: Will remain free from infection Outcome: Progressing Goal: Respiratory complications will improve Outcome: Progressing Goal: Cardiovascular complication will be avoided Outcome: Progressing   Problem: Activity: Goal: Risk for activity intolerance will decrease Outcome: Progressing   Problem: Nutrition: Goal: Adequate nutrition will be maintained Outcome: Progressing   Problem: Coping: Goal: Level of anxiety will decrease Outcome: Progressing   Problem: Elimination: Goal: Will not experience complications related to bowel motility Outcome: Progressing Goal: Will not experience complications related to urinary retention Outcome: Progressing   Problem: Pain Managment: Goal: General experience of comfort will improve and/or be controlled Outcome: Progressing   Problem: Skin Integrity: Goal: Risk for impaired skin integrity will decrease Outcome: Progressing   Problem: Education: Goal: Ability to verbalize understanding of medication therapies will improve Outcome: Progressing   Problem: Cardiac: Goal: Ability to achieve and maintain adequate cardiopulmonary perfusion will improve Outcome: Progressing

## 2024-05-24 NOTE — Progress Notes (Signed)
 Progress Note   Patient: Kendra Clarke FMW:[id number] DOB: December 29, 1940 DOA: 05/21/2024     3 DOS: the patient was seen and examined on 05/24/2024   Brief hospital course: Kendra Clarke is a 83 y.o. female with medical history significant for COPD, asthma, osteoarthritis, type 2 diabetes mellitus and hypertension, presented to the emergency room with acute onset of worsening dyspnea with associated 4 pillow orthopnea and paroxysmal nocturnal dyspnea.  She admitted to worsening lower extremity edema as well as dyspnea on exertion lately.  No fever or chills.  She has been having dry cough and wheezing.  No dysuria, oliguria or hematuria or flank pain.  No fever or chills.  No nausea or vomiting or abdominal pain.  No bleeding diathesis.   Assessment and Plan: * Acute on chronic diastolic CHF (congestive heart failure) (HCC) TTE 2024  LVEF of 55 to 60% with grade 2 diastolic dysfunction, mild aortic stenosis with mild calcification of the aortic valve and mild aortic regurgitation. On torsemide  at home, lisinopril , and hydrochlorothiazide , and amlodipine   Patient reports she has difficulty getting her medications when she runs out. Nephew who was previously helping her had a recent stroke. Her son is in the [employer] per patient and can not help.  Dyspnea improved continues to have orthopnea and BLE edema -Per cardiology IV lasix  ,metop succinate, farxiga ,  and losartan  -Will stop amlodipine  and start low dose spironolactone  in the AM  -F/u TTE -Strict I/Os, daily weights  TED hose   GERD without esophagitis - Will continue PPI therapy.  Type 2 diabetes mellitus with peripheral neuropathy (HCC) Well controlled.  Continue neurontin   COPD Ashtma Not in exacerbation Continue prn ipratropium   Microcytic anemia  Iron /TIBC/Ferritin/ %Sat    Component Value Date/Time   IRON  24 (L) 05/23/2024 0545   TIBC 420 05/23/2024 0545   FERRITIN 14 05/23/2024 0545   IRONPCTSAT 6 (L) 05/23/2024 0545       Latest Ref Rng & Units 05/24/2024    5:15 AM 05/23/2024    5:45 AM 05/22/2024    6:10 AM  CBC  WBC 4.0 - 10.5 K/uL 5.5  5.6  6.4   Hemoglobin 12.0 - 15.0 g/dL 8.4  8.4  8.8   Hematocrit 36.0 - 46.0 % 26.2  27.5  28.7   Platelets 150 - 400 K/uL 228  135  157    Hemoglobin stable  Will continue to supplement iron    Hyponatremia  IN the setting of hypervolemia  CTM with diuresis   Hypokalemia In the setting of diuresis.  Replace  Essential hypertension Per above    OA  PRN analgesia      Subjective: Dyspnea is improved.  Physical Exam: Vitals:   05/24/24 1032 05/24/24 1205 05/24/24 1702 05/24/24 1806  BP: (!) 157/64 (!) 145/61 (!) 144/63 (!) 135/52  Pulse: 90 85 82 78  Resp:  18 18 20   Temp:  98.5 F (36.9 C) 99 F (37.2 C)   TempSrc:  Oral Oral   SpO2:  99% 98% 98%  Weight:      Height:        Constitutional: In no distress.  Cardiovascular: Normal rate, regular rhythm. 2+ bilateral lower extremity edema  Pulmonary: Non labored breathing on room air, no wheezing or rales.   Abdominal: Soft. Non distended and non tender Musculoskeletal: Normal range of motion.     Neurological: Alert and oriented to person, place, and time. Non focal  Skin: Skin is warm and dry.  Data Reviewed:     Latest Ref Rng & Units 05/24/2024    5:15 AM 05/23/2024    5:45 AM 05/22/2024    6:10 AM  BMP  Glucose 70 - 99 mg/dL 891  885  868   BUN 8 - 23 mg/dL 12  13  11    Creatinine 0.44 - 1.00 mg/dL 9.14  9.14  9.17   Sodium 135 - 145 mmol/L 128  128  129   Potassium 3.5 - 5.1 mmol/L 3.2  3.5  4.2   Chloride 98 - 111 mmol/L 91  90  95   CO2 22 - 32 mmol/L 29  29  25    Calcium  8.9 - 10.3 mg/dL 7.8  8.1  8.8       Latest Ref Rng & Units 05/24/2024    5:15 AM 05/23/2024    5:45 AM 05/22/2024    6:10 AM  CBC  WBC 4.0 - 10.5 K/uL 5.5  5.6  6.4   Hemoglobin 12.0 - 15.0 g/dL 8.4  8.4  8.8   Hematocrit 36.0 - 46.0 % 26.2  27.5  28.7   Platelets 150 - 400 K/uL 228  135  157       Family Communication: None at bedside   Disposition: Status is: Inpatient Remains inpatient appropriate because: IV diuresis  Planned Discharge Destination: Pending     Time spent: 35 minutes  Author: Alban Pepper, MD 05/24/2024 7:49 PM  For on call review [email or web address].

## 2024-05-24 NOTE — Progress Notes (Addendum)
 SUBJECTIVE: Patient is breathing much better and denies any chest pain.  She wants to go home.   Vitals:   05/23/24 2339 05/24/24 0500 05/24/24 0829 05/24/24 1032  BP: (!) 151/64  (!) 157/64 (!) 157/64  Pulse: 88  90 90  Resp: 20  18   Temp: 99 F (37.2 C)  98.2 F (36.8 C)   TempSrc:   Oral   SpO2: 96%  98%   Weight:  83.3 kg    Height:        Intake/Output Summary (Last 24 hours) at 05/24/2024 1035 Last data filed at 05/24/2024 1014 Gross per 24 hour  Intake 170 ml  Output 1700 ml  Net -1530 ml    LABS: Basic Metabolic Panel: Recent Labs    05/23/24 0545 05/24/24 0515  NA 128* 128*  K 3.5 3.2*  CL 90* 91*  CO2 29 29  GLUCOSE 114* 108*  BUN 13 12  CREATININE 0.85 0.85  CALCIUM  8.1* 7.8*  MG 1.2* 1.9   Liver Function Tests: Recent Labs    05/21/24 2211  AST 47*  ALT 23  ALKPHOS 62  BILITOT 1.2  PROT 6.6  ALBUMIN  3.3*   Recent Labs    05/21/24 2211  LIPASE 26   CBC: Recent Labs    05/21/24 2211 05/22/24 0610 05/23/24 0545 05/24/24 0515  WBC 6.5   < > 5.6 5.5  NEUTROABS 5.6  --   --   --   HGB 8.7*   < > 8.4* 8.4*  HCT 28.5*   < > 27.5* 26.2*  MCV 78.5*   < > 77.0* 75.9*  PLT 169   < > 135* 228   < > = values in this interval not displayed.   Cardiac Enzymes: No results for input(s): CKTOTAL, CKMB, CKMBINDEX, TROPONINI in the last 72 hours. BNP: Invalid input(s): POCBNP D-Dimer: No results for input(s): DDIMER in the last 72 hours. Hemoglobin A1C: No results for input(s): HGBA1C in the last 72 hours. Fasting Lipid Panel: Recent Labs    05/23/24 0545  CHOL 141  HDL 49  LDLCALC 80  TRIG 59  CHOLHDL 2.9   Thyroid Function Tests: No results for input(s): TSH, T4TOTAL, T3FREE, THYROIDAB in the last 72 hours.  Invalid input(s): FREET3 Anemia Panel: Recent Labs    05/23/24 0545  FERRITIN 14  TIBC 420  IRON  24*     PHYSICAL EXAM General: Well developed, well nourished, in no acute distress HEENT:   Normocephalic and atramatic Neck:  No JVD.  Lungs: Clear bilaterally to auscultation and percussion. Heart: HRRR . Normal S1 and S2 without gallops or murmurs.  Abdomen: Bowel sounds are positive, abdomen soft and non-tender  Msk:  Back normal, normal gait. Normal strength and tone for age. Extremities: No clubbing, cyanosis or edema.   Neuro: Alert and oriented X 3. Psych:  Good affect, responds appropriately  TELEMETRY: Sinus rhythm  ASSESSMENT AND PLAN: #1 acute decompensated heart failure with severe shortness of breath, lower extremity edema, orthopnea and PND.  Patient received Lasix  80 mg every 12 hours and guideline directed medical therapy and is feeling much better this morning.  Waiting for echocardiogram.   #2 mildly elevated troponin most likely due to due to demand ischemia secondary to heart failure.  Patient has not seen cardiologist from [facility] where she normally used to go and needs to be evaluated for ischemic workup.  Patient is very anxious to go home but needs follow-up at [facility] cardiology for possible  nuclear stress test. #2 hypomagnesemia, replaced with 2 g of magnesium  yesterday.  Magnesium  1.9 today.  However potassium is 3.2 advise replacing that.   ICD-10-CM   1. Acute on chronic congestive heart failure, unspecified heart failure type (HCC)  I50.9     2. Hypervolemia, unspecified hypervolemia type  E87.70       Principal Problem:   Acute on chronic diastolic CHF (congestive heart failure) (HCC) Active Problems:   Essential hypertension   Type 2 diabetes mellitus with peripheral neuropathy (HCC)   GERD without esophagitis    Kendra Bathe, MD, FACC 05/24/2024 10:35 AM

## 2024-05-25 ENCOUNTER — Other Ambulatory Visit (HOSPITAL_COMMUNITY): Payer: Self-pay

## 2024-05-25 ENCOUNTER — Telehealth (HOSPITAL_COMMUNITY): Payer: Self-pay | Admitting: Pharmacy Technician

## 2024-05-25 DIAGNOSIS — I5033 Acute on chronic diastolic (congestive) heart failure: Secondary | ICD-10-CM | POA: Diagnosis not present

## 2024-05-25 LAB — CBC
HCT: 28.6 % — ABNORMAL LOW (ref 36.0–46.0)
Hemoglobin: 8.8 g/dL — ABNORMAL LOW (ref 12.0–15.0)
MCH: 23.7 pg — ABNORMAL LOW (ref 26.0–34.0)
MCHC: 30.8 g/dL (ref 30.0–36.0)
MCV: 76.9 fL — ABNORMAL LOW (ref 80.0–100.0)
Platelets: 139 K/uL — ABNORMAL LOW (ref 150–400)
RBC: 3.72 MIL/uL — ABNORMAL LOW (ref 3.87–5.11)
RDW: 15.9 % — ABNORMAL HIGH (ref 11.5–15.5)
WBC: 4.8 K/uL (ref 4.0–10.5)
nRBC: 0 % (ref 0.0–0.2)

## 2024-05-25 LAB — BASIC METABOLIC PANEL WITH GFR
Anion gap: 8 (ref 5–15)
BUN: 13 mg/dL (ref 8–23)
CO2: 29 mmol/L (ref 22–32)
Calcium: 8 mg/dL — ABNORMAL LOW (ref 8.9–10.3)
Chloride: 89 mmol/L — ABNORMAL LOW (ref 98–111)
Creatinine, Ser: 0.96 mg/dL (ref 0.44–1.00)
GFR, Estimated: 59 mL/min — ABNORMAL LOW (ref >60)
Glucose, Bld: 94 mg/dL (ref 70–99)
Potassium: 3.6 mmol/L (ref 3.5–5.1)
Sodium: 126 mmol/L — ABNORMAL LOW (ref 135–145)

## 2024-05-25 LAB — MAGNESIUM: Magnesium: 1.7 mg/dL (ref 1.7–2.4)

## 2024-05-25 LAB — TROPONIN I (HIGH SENSITIVITY): Troponin I (High Sensitivity): 22 ng/L — ABNORMAL HIGH (ref <18)

## 2024-05-25 MED ORDER — METOPROLOL SUCCINATE ER 25 MG PO TB24
25.0000 mg | ORAL_TABLET | Freq: Every day | ORAL | Status: DC
  Administered 2024-05-25: 25 mg via ORAL
  Filled 2024-05-25: qty 1

## 2024-05-25 MED ORDER — FUROSEMIDE 10 MG/ML IJ SOLN
60.0000 mg | Freq: Two times a day (BID) | INTRAMUSCULAR | Status: DC
  Filled 2024-05-25: qty 6

## 2024-05-25 MED ORDER — FUROSEMIDE 10 MG/ML IJ SOLN
60.0000 mg | Freq: Two times a day (BID) | INTRAMUSCULAR | Status: DC
  Administered 2024-05-25 – 2024-05-26 (×4): 60 mg via INTRAVENOUS
  Filled 2024-05-25 (×4): qty 6

## 2024-05-25 MED ORDER — ORAL CARE MOUTH RINSE: 15.0000 mL | OROMUCOSAL | Status: DC | PRN

## 2024-05-25 MED ORDER — ORAL CARE MOUTH RINSE
15.0000 mL | OROMUCOSAL | Status: DC
Start: 2024-05-25 — End: 2024-05-28
  Administered 2024-05-25 – 2024-05-28 (×7): 15 mL via OROMUCOSAL

## 2024-05-25 MED ORDER — POTASSIUM CHLORIDE 20 MEQ PO PACK
40.0000 meq | PACK | Freq: Once | ORAL | Status: AC
  Administered 2024-05-25: 40 meq via ORAL
  Filled 2024-05-25: qty 2

## 2024-05-25 NOTE — Progress Notes (Signed)
 At 0945 pt c/o 9/10 lt sided chest pain. No other symptoms. Pt lying bed, done with breakfast. Provider Fausto advised. Pt denies chest pain at this time. EKG ordered and done. Awaiting lab.

## 2024-05-25 NOTE — Telephone Encounter (Signed)
 Patient Product/process development scientist completed.    The patient is insured through [employer]. Patient has Medicare and is not eligible for a copay card, but may be able to apply for patient assistance or Medicare RX Payment Plan (Patient Must reach out to their plan, if eligible for payment plan), if available.    Ran test claim for sacubitril-valsartan 24-26 mg and the current 30 day co-pay is $0.00.  Ran test claim for Farxiga  10 mg and the current 30 day co-pay is $0.00.  Ran test claim for Jardiance 10 mg and the current 30 day co-pay is $0.00.  This test claim was processed through [facility]- copay amounts may vary at other pharmacies due to pharmacy/plan contracts, or as the patient moves through the different stages of their insurance plan.     Reyes Sharps, CPHT Pharmacy Technician III Certified Patient Advocate [facility] Patient Advocate Team Direct Number: [phone]  Fax: [phone]

## 2024-05-25 NOTE — Progress Notes (Signed)
 [facility] CLINIC CARDIOLOGY PROGRESS NOTE       [name] MRN: [id number] DOB/AGE: Apr 07, 1941 83 y.o.  Admit date: 05/21/2024 Referring Physician Dr. Lawence Primary Physician Donnie Handing, PA Primary Cardiologist None Reason for Consultation CHF  HPI: Kendra Clarke is a 83 y.o. female  with a past medical history of hypertension, type 2 diabetes, COPD, asthma, osteoarthritis who presented to the ED on 05/21/2024 for worsening dyspnea, lower extremity swelling and 4 pillow orthopnea.  Patient diagnosed with congestive heart failure in 2023 by [facility], however has not seen a cardiologist. Cardiology was consulted for further evaluation.   Interval History: -Patient seen and examined this AM and laying comfortably in hospital bed with son at bedside, patient at a slight incline. Patient states she feels a lot better, improvement to SOB and LEE and denies chest pain, headedness/dizziness.  Patient still with 3+ LEE. -Patients BP and HR stable this AM. Overnight Tele showed no significant events.  -Yesterday UOP 4.7L, with  stable renal function. - Patients urine is still light colored/clear this AM indicating pt is tolerating diuresis.  -Patient remains on room air with stable SpO2.     Review of systems complete and found to be negative unless listed above    Past Medical History:  Diagnosis Date   Arthritis    Asthma    COPD (chronic obstructive pulmonary disease) (HCC)    Diabetes mellitus without complication (HCC)    Hypertension    Liver disease    Umbilical hernia     Past Surgical History:  Procedure Laterality Date   ABDOMINAL HYSTERECTOMY     BLADDER SURGERY     CHOLECYSTECTOMY     ESOPHAGOGASTRODUODENOSCOPY N/A 09/03/2023   Procedure: ESOPHAGOGASTRODUODENOSCOPY (EGD);  Surgeon: Onita Elspeth Sharper, DO;  Location: [facility] ENDOSCOPY;  Service: Gastroenterology;  Laterality: N/A;   nose surgery      Medications Prior to Admission  Medication Sig  Dispense Refill Last Dose/Taking   amLODipine  (NORVASC ) 10 MG tablet Take 10 mg by mouth daily.   Taking   ATROVENT  HFA 17 MCG/ACT inhaler Inhale into the lungs.   Taking   benzonatate  (TESSALON ) 200 MG capsule Take 1 capsule (200 mg total) by mouth 3 (three) times daily as needed for cough. 20 capsule 0 Taking As Needed   [Paused] Dulaglutide (TRULICITY) 0.75 MG/0.5ML SOPN Inject 0.75 mg into the skin once a week.   Taking   ergocalciferol  (VITAMIN D2) 1.25 MG (50000 UT) capsule Take 50,000 Units by mouth once a week.   Taking   hydrochlorothiazide  (MICROZIDE ) 12.5 MG capsule Take 12.5 mg by mouth daily.   Taking   lisinopril  (ZESTRIL ) 20 MG tablet Take 20 mg by mouth daily.   Taking   meloxicam  (MOBIC ) 15 MG tablet Take 15 mg by mouth daily.   Taking   pantoprazole  (PROTONIX ) 40 MG tablet Take 1 tablet (40 mg total) by mouth 2 (two) times daily before a meal. 30 tablet 1 Taking   potassium chloride  (KLOR-CON  M) 10 MEQ tablet Take 10 mEq by mouth 2 (two) times daily.   Taking   SYMBICORT  160-4.5 MCG/ACT inhaler INHALE 2 PUFFS TWICE DAILY FOR ASTHMA   Taking   torsemide  (DEMADEX ) 20 MG tablet Take 40 mg by mouth daily.   Taking   gabapentin (NEURONTIN) 100 MG capsule Take 100 mg by mouth 2 (two) times daily. (Patient not taking: Reported on 05/21/2024)   Not Taking   metoprolol  tartrate (LOPRESSOR ) 50 MG tablet Take 50  mg by mouth 2 (two) times daily. (Patient not taking: Reported on 05/21/2024)   Not Taking   Social History   Socioeconomic History   Marital status: Widowed    Spouse name: Not on file   Number of children: Not on file   Years of education: Not on file   Highest education level: Not on file  Occupational History   Not on file  Tobacco Use   Smoking status: Never   Smokeless tobacco: Never  Vaping Use   Vaping status: Never Used  Substance and Sexual Activity   Alcohol use: No   Drug use: No   Sexual activity: Not on file  Other Topics Concern   Not on file  Social  History Narrative   Not on file   Social Drivers of Health   Financial Resource Strain: Low Risk  (09/09/2023)   Received from [facility]   Overall Financial Resource Strain (CARDIA)    Difficulty of Paying Living Expenses: Not very hard  Food Insecurity: No Food Insecurity (05/23/2024)   Hunger Vital Sign    Worried About Running Out of Food in the Last Year: Never true    Ran Out of Food in the Last Year: Never true  Transportation Needs: No Transportation Needs (05/23/2024)   PRAPARE - Administrator, Civil Service (Medical): No    Lack of Transportation (Non-Medical): No  Physical Activity: Not on file  Stress: Not on file  Social Connections: Unknown (05/23/2024)   Social Connection and Isolation Panel    Frequency of Communication with Friends and Family: More than three times a week    Frequency of Social Gatherings with Friends and Family: More than three times a week    Attends Religious Services: Not on file    Active Member of Clubs or Organizations: Not on file    Attends Banker Meetings: Not on file    Marital Status: Not on file  Intimate Partner Violence: Not At Risk (05/23/2024)   Humiliation, Afraid, Rape, and Kick questionnaire    Fear of Current or Ex-Partner: No    Emotionally Abused: No    Physically Abused: No    Sexually Abused: No    Family History  Problem Relation Age of Onset   Multiple sclerosis Mother    Hypertension Father    COPD Father    Varicose Veins Maternal Aunt    Stroke Nephew      Vitals:   05/24/24 1952 05/25/24 0014 05/25/24 0410 05/25/24 0500  BP: (!) 123/55 (!) 142/60 (!) 148/60   Pulse: 80 78 83   Resp: 19 19 18    Temp: 98.8 F (37.1 C) 98.3 F (36.8 C) 98.6 F (37 C)   TempSrc: Oral Oral Oral   SpO2: 94% 95% 95%   Weight:    82.2 kg  Height:        PHYSICAL EXAM General: Chronically ill-appearing elderly female, well nourished, in no acute distress. HEENT: Normocephalic and  atraumatic. Neck: No JVD.   Lungs: Normal respiratory effort on room air. Diminished breath sounds. Heart: HRR, elevated rate. Normal S1 and S2 without gallops or murmurs.  Abdomen: Non-distended appearing.  Msk: Normal strength and tone for age. Extremities: Warm and well perfused. No clubbing, cyanosis. 3+ pitting edema.  Neuro: Alert and oriented X 3. Psych: Answers questions appropriately.   Labs: Basic Metabolic Panel: Recent Labs    05/24/24 0515 05/25/24 0358  NA 128* 126*  K 3.2*  3.6  CL 91* 89*  CO2 29 29  GLUCOSE 108* 94  BUN 12 13  CREATININE 0.85 0.96  CALCIUM  7.8* 8.0*  MG 1.9 1.7   Liver Function Tests: No results for input(s): AST, ALT, ALKPHOS, BILITOT, PROT, ALBUMIN  in the last 72 hours.  No results for input(s): LIPASE, AMYLASE in the last 72 hours.  CBC: Recent Labs    05/24/24 0515 05/25/24 0358  WBC 5.5 4.8  HGB 8.4* 8.8*  HCT 26.2* 28.6*  MCV 75.9* 76.9*  PLT 228 139*   Cardiac Enzymes: No results for input(s): CKTOTAL, CKMB, CKMBINDEX, TROPONINIHS in the last 72 hours.  BNP: No results for input(s): BNP in the last 72 hours.  D-Dimer: No results for input(s): DDIMER in the last 72 hours. Hemoglobin A1C: No results for input(s): HGBA1C in the last 72 hours. Fasting Lipid Panel: Recent Labs    05/23/24 0545  CHOL 141  HDL 49  LDLCALC 80  TRIG 59  CHOLHDL 2.9   Thyroid Function Tests: No results for input(s): TSH, T4TOTAL, T3FREE, THYROIDAB in the last 72 hours.  Invalid input(s): FREET3 Anemia Panel: Recent Labs    05/23/24 0545  FERRITIN 14  TIBC 420  IRON  24*     Radiology: US  ARTERIAL ABI (SCREENING LOWER EXTREMITY) Result Date: 05/24/2024 CLINICAL DATA:  Lower extremity edema EXAM: NONINVASIVE PHYSIOLOGIC VASCULAR STUDY OF BILATERAL LOWER EXTREMITIES TECHNIQUE: Evaluation of both lower extremities were performed at rest, including calculation of ankle-brachial indices with  single level Doppler, pressure and pulse volume recording. COMPARISON:  None Available. FINDINGS: Right ABI: Unable to calculate due to elevated velocities at the ankle and the inability to perform a blood pressure in the right upper extremity due to indwelling IV. Left ABI:  Not calculated due to elevated velocities at the ankle. Right Lower Extremity:  Normal arterial waveforms at the ankle. Left Lower Extremity:  Normal arterial waveforms at the ankle. IMPRESSION: 1. Ankle brachial indices cannot be calculated due to noncompressible vessel calcifications (medial arterial sclerosis of Monckeberg), as well as the inability to perform a right upper extremity blood pressure due to indwelling IV. Electronically Signed   By: Ozell Daring M.D.   On: 05/24/2024 16:23   DG Chest Portable 1 View Result Date: 05/21/2024 CLINICAL DATA:  Shortness of breath EXAM: PORTABLE CHEST 1 VIEW COMPARISON:  Chest x-ray 08/27/2023 FINDINGS: The heart is enlarged. There are increased central interstitial markings bilaterally. There is a cluster of micro nodules in the right upper lobe. There is atelectatic change in both lung bases. There is no pleural effusion or pneumothorax. No acute fractures are seen. IMPRESSION: 1. Cardiomegaly with increased central interstitial markings bilaterally, likely pulmonary edema. 2. Cluster of micro nodules in the right upper lobe, likely infectious or inflammatory. Electronically Signed   By: Greig Pique M.D.   On: 05/21/2024 22:44    ECHO pending  TELEMETRY reviewed by me 05/25/2024: Sinus rhythm, rate 80s  EKG reviewed by me: sinus tachycardia, rate 113 bpm with RBBB, non-specific ST-T wave changes (similar to prior EKG).  Data reviewed by me 05/25/2024: last 24h vitals tele labs imaging I/O hospitalist progress notes.  Principal Problem:   Acute on chronic diastolic CHF (congestive heart failure) (HCC) Active Problems:   Essential hypertension   Type 2 diabetes mellitus with  peripheral neuropathy (HCC)   GERD without esophagitis    ASSESSMENT AND PLAN:  Kendra Clarke is a 83 y.o. female  with a past medical history of hypertension, type  2 diabetes, COPD, asthma, osteoarthritis who presented to the ED on 05/21/2024 for worsening dyspnea, lower extremity swelling and 4 pillow orthopnea.  Patient diagnosed with congestive heart failure in 2023 by [facility], however has not seen a cardiologist. Cardiology was consulted for further evaluation.   # Acute on chronic HFpEF # COPD BNP elevated at 1700. CXR with cardiomegaly and pulmonary vascular congestions. - Echo ordered.  Further recommendations pending results. - Monitor and replenish electrolytes for a goal K >4, Mag >2  - Decreased IV Lasix  to 60 mg twice daily due to worsening hyponatremia. (Home doses torsemide  40 mg daily.) - Closely monitor UOP and renal function. - Continue dapagliflozin  10 mg daily. (Copay $0) - Continue spironolactone  25 mg daily.  # Hypertension # Hyperlipidemia Patient without chest pain.  EKG with non-specific ST-T wave changes (similar to prior EKG). Troponins minimally elevated and flat 60 > 60.  Lipid panel this admission within normal limits, LDL 80.  - Continue losartan  50 mg daily. - Increase  metoprolol  succinate to 25 mg daily.  - Continue atorvastatin  40 mg daily. -Minimally elevated and flat is most consistent with demand/supply mismatch and not ACS.  This patient's plan of care was discussed and created with Dr. Ammon and he is in agreement.  Signed: Dorene Comfort, PA-C  05/25/2024, 8:11 AM [facility] Cardiology

## 2024-05-25 NOTE — Progress Notes (Signed)
 Progress Note   Patient: Kendra Clarke FMW:[id number] DOB: 07/25/1941 DOA: 05/21/2024     4 DOS: the patient was seen and examined on 05/25/2024   Brief hospital course: HPI on admission 05/21/2024: Kendra Clarke is a 83 y.o. female with medical history significant for COPD, asthma, osteoarthritis, type 2 diabetes mellitus and hypertension, presented to the emergency room with acute onset of worsening dyspnea with associated 4 pillow orthopnea and paroxysmal nocturnal dyspnea.  She admitted to worsening lower extremity edema as well as dyspnea on exertion lately.  No fever or chills.  She has been having dry cough and wheezing.  No dysuria, oliguria or hematuria or flank pain.  No fever or chills.  No nausea or vomiting or abdominal pain.  No bleeding diathesis.   Patient was admitted to medicine service with Cardiology consulted for further evaluation and management of acute on chronic diastolic CHF.    Patient is being treated with IV Lasix  and volume status is gradually improving.    Assessment and Plan:  * Acute on chronic diastolic CHF (congestive heart failure) (HCC) TTE 2024 LVEF of 55 to 60% with grade 2 diastolic dysfunction, mild aortic stenosis with mild calcification of the aortic valve and mild aortic regurgitation. Home meds: torsemide , lisinopril , and hydrochlorothiazide , and amlodipine  Patient reports she has difficulty getting her medications when she runs out.  Nephew who was previously helping her had a recent stroke.  Her son is in the [employer] per patient and can not help.  Dyspnea has improved since admission, continues to have orthopnea and BLE edema --Mgmt per Cardiology - appreciate recommendations --On IV Lasix  --Toprol -XL, Farxiga , and losartan  --Started low dose spironolactone   --Echo pending follow results --Strict I/Os, daily weights  --TED hose    Hyponatremia  - likely hypervolemic --Diuresis as above --Monitor BMP   Hypokalemia - In the setting of  diuresis.  Replaced --Monitor BMP, replace K PRN   Essential hypertension --mgmt as above --titrate regimen  --amlodipine  was stopped and low dose spironolactone  added for CHF  Type 2 diabetes mellitus with peripheral neuropathy Well controlled.  --Continue Neurontin   Microcytic anemia - Hemoglobin stable  --Continue iron  supplement   GERD without esophagitis - Will continue PPI therapy.  COPD Ashtma Not in exacerbation --Continue PRN ipratropium   Osteoarthritis --pain control PRN per orders      Subjective: Pt awake resting in bed this AM.  She denies shortness of breath.  Earlier AM had some left-sided chest discomfort that she attributes to the fluid, states it resolved on its own.  No other acute complaints.     Physical Exam: Vitals:   05/25/24 0500 05/25/24 0900 05/25/24 0949 05/25/24 1137  BP:  (!) 123/47 (!) 123/47 (!) 154/67  Pulse:  92  80  Resp:  18  20  Temp:  98.2 F (36.8 C)  98.4 F (36.9 C)  TempSrc:      SpO2:  95%  97%  Weight: 82.2 kg     Height:       General exam: awake, alert, no acute distress, obese HEENT: moist mucus membranes, hearing grossly normal  Respiratory system: CTAB diminished bases, no wheezes, rales or rhonchi, normal respiratory effort. Cardiovascular system: normal S1/S2, RRR, 2/6 systolic murmur, BLE edema with pedal edema improving as evidence by skin wrinkling, proximal BLE edema remains Gastrointestinal system: soft, NT, ND, no HSM felt, +bowel sounds. Central nervous system: A&O x 3. no gross focal neurologic deficits, normal speech Skin: dry, intact,  normal temperature Psychiatry: normal mood, congruent affect, judgement and insight appear normal    Data Reviewed:  Notable labs --  Na 128 >> 126 Cl 89 Ca 8.0 Hbg 8.8 stable   Family Communication: None present on rounds. Will attempt to call as time allows.  Disposition: Status is: Inpatient Remains inpatient appropriate because: remains on IV  diuresis   Planned Discharge Destination: HH vs SNF    Time spent: 45 minutes  Author: Burnard DELENA Cunning, DO 05/25/2024 2:35 PM  For on call review [email or web address].

## 2024-05-25 NOTE — TOC CM/SW Note (Signed)
 Transition of Care [facility]) - Inpatient Brief Assessment   Patient Details  Name: Kendra Clarke MRN: [id number] Date of Birth: December 10, 1940  Transition of Care [facility]) CM/SW Contact:    Lauraine JAYSON Carpen, LCSW Phone Number: 05/25/2024, 10:48 AM   Clinical Narrative: CSW reviewed chart. No TOC needs identified at this time. CSW will continue to follow progress. Please place [facility] consult if any needs arise.  Transition of Care Asessment: Insurance and Status: Insurance coverage has been reviewed Patient has primary care physician: Yes Home environment has been reviewed: Single family home Prior level of function:: Not documented Prior/Current Home Services: No current home services Social Drivers of Health Review: SDOH reviewed no interventions necessary Readmission risk has been reviewed: Yes Transition of care needs: no transition of care needs at this time

## 2024-05-25 NOTE — Plan of Care (Signed)
   Problem: Education: Goal: Knowledge of General Education information will improve Description Including pain rating scale, medication(s)/side effects and non-pharmacologic comfort measures Outcome: Progressing

## 2024-05-25 NOTE — Care Management Important Message (Signed)
 Important Message  Patient Details  Name: Kendra Clarke MRN: [id number] Date of Birth: January 10, 1941   Important Message Given:  Yes - Medicare IM     Rojelio SHAUNNA Rattler 05/25/2024, 3:07 PM

## 2024-05-25 NOTE — Evaluation (Signed)
 Physical Therapy Evaluation Patient Details Name: Kendra Clarke MRN: [id number] DOB: 08-18-1941 Today's Date: 05/25/2024  History of Present Illness  Pt is an 83 y.o. female presenting to hospital 05/21/24 with c/o SOB progressively worsening x48 hours and several weeks of increasing weight gain and LE edema.  Pt admitted with acute on chronic diastolic CHF.  PMH includes sepsis, DM, htn, HLD, DM, COPD, large ventral hernia, superficial thrombophlebitis, CHF diastolic.  Clinical Impression  Prior to recent medical concerns, pt reports being ambulatory (uses SPC outside); lives with her nephew (both help each other as needed); 5 STE B railings to 1 level home; no recent falls reported.  Currently pt is modified independent with bed mobility; min assist (hand hold assist) with transfer bed to/from [facility]; CGA sit to/from stand from bed with RW use; and CGA to ambulate 120 feet with RW use.  SOB noted with increased distance ambulating (SpO2 sats 97% or greater on room air during session; HR 75 bpm at rest and increased up to 92 bpm with activity).  Generalized weakness and decreased activity tolerance from baseline noted.  Pt would currently benefit from skilled PT to address noted impairments and functional limitations (see below for any additional details).  Upon hospital discharge, pt would benefit from ongoing therapy.     If plan is discharge home, recommend the following: A little help with walking and/or transfers;A little help with bathing/dressing/bathroom;Assistance with cooking/housework;Assist for transportation;Help with stairs or ramp for entrance   Can travel by private vehicle    Yes    Equipment Recommendations Rolling walker (2 wheels)  Recommendations for Other Services       Functional Status Assessment Patient has had a recent decline in their functional status and demonstrates the ability to make significant improvements in function in a reasonable and predictable amount of  time.     Precautions / Restrictions Precautions Precautions: Fall Recall of Precautions/Restrictions: Intact Restrictions Weight Bearing Restrictions Per Provider Order: No      Mobility  Bed Mobility Overal bed mobility: Needs Assistance Bed Mobility: Supine to Sit, Sit to Supine     Supine to sit: Modified independent (Device/Increase time), HOB elevated Sit to supine: Modified independent (Device/Increase time), HOB elevated   General bed mobility comments: mild increased effort to perform on own    Transfers Overall transfer level: Needs assistance Equipment used: None, Rolling walker (2 wheels) Transfers: Sit to/from Stand, Bed to chair/wheelchair/BSC Sit to Stand: Contact guard assist   Step pivot transfers: Min assist (hand hold assist)       General transfer comment: stand step turn bed to/from [facility] with hand hold assist; CGA to stand from bed with RW use; vc's for UE positioning    Ambulation/Gait Ambulation/Gait assistance: Contact guard assist Gait Distance (Feet): 120 Feet Assistive device: Rolling walker (2 wheels) Gait Pattern/deviations: Step-through pattern, Decreased step length - right, Decreased step length - left Gait velocity: decreased     General Gait Details: steady ambulation with RW use  Stairs            Wheelchair Mobility     Tilt Bed    Modified Rankin (Stroke Patients Only)       Balance Overall balance assessment: Needs assistance Sitting-balance support: No upper extremity supported, Feet supported Sitting balance-Leahy Scale: Good Sitting balance - Comments: steady reaching within BOS   Standing balance support: Single extremity supported Standing balance-Leahy Scale: Fair Standing balance comment: steady static standing with at least single UE support  Pertinent Vitals/Pain Pain Assessment Pain Assessment: No/denies pain    Home Living Family/patient expects to be  discharged to:: Private residence Living Arrangements: Other relatives (Pt's nephew) Available Help at Discharge: Family;Available 24 hours/day Type of Home: Mobile home Home Access: Stairs to enter Entrance Stairs-Rails: Right;Left;Can reach both Entrance Stairs-Number of Steps: 5   Home Layout: One level Home Equipment: Agricultural consultant (2 wheels);Standard Walker;Cane - single point;Crutches;BSC/3in1;Shower seat;Grab bars - toilet;Grab bars - tub/shower      Prior Function Prior Level of Function : Independent/Modified Independent             Mobility Comments: Pt reports she and her nephew help each other as needed; no recent falls reported; no AD use in home; use SPC outside.       Extremity/Trunk Assessment   Upper Extremity Assessment Upper Extremity Assessment: Generalized weakness    Lower Extremity Assessment Lower Extremity Assessment: Generalized weakness    Cervical / Trunk Assessment Cervical / Trunk Assessment: Other exceptions Cervical / Trunk Exceptions: forward head/shoulders  Communication   Communication Communication: No apparent difficulties    Cognition Arousal: Alert Behavior During Therapy: WFL for tasks assessed/performed   PT - Cognitive impairments: No apparent impairments                         Following commands: Intact       Cueing Cueing Techniques: Verbal cues     General Comments General comments (skin integrity, edema, etc.): L>R LE swelling noted.  Nursing cleared pt for participation in physical therapy.  Pt agreeable to PT session.    Exercises     Assessment/Plan    PT Assessment Patient needs continued PT services  PT Problem List Decreased strength;Decreased activity tolerance;Decreased balance;Decreased mobility;Decreased knowledge of use of DME;Decreased knowledge of precautions;Cardiopulmonary status limiting activity       PT Treatment Interventions DME instruction;Gait training;Stair  training;Functional mobility training;Therapeutic activities;Therapeutic exercise;Balance training;Patient/family education    PT Goals (Current goals can be found in the Care Plan section)  Acute Rehab PT Goals Patient Stated Goal: to improve strength and endurance PT Goal Formulation: With patient Time For Goal Achievement: 06/08/24 Potential to Achieve Goals: Good    Frequency Min 2X/week     Co-evaluation               AM-PAC PT 6 Clicks Mobility  Outcome Measure Help needed turning from your back to your side while in a flat bed without using bedrails?: None Help needed moving from lying on your back to sitting on the side of a flat bed without using bedrails?: None Help needed moving to and from a bed to a chair (including a wheelchair)?: A Little Help needed standing up from a chair using your arms (e.g., wheelchair or bedside chair)?: A Little Help needed to walk in hospital room?: A Little Help needed climbing 3-5 steps with a railing? : A Little 6 Click Score: 20    End of Session Equipment Utilized During Treatment: Gait belt Activity Tolerance: Patient limited by fatigue Patient left: in bed;with call bell/phone within reach;with bed alarm set Nurse Communication: Mobility status;Precautions PT Visit Diagnosis: Other abnormalities of gait and mobility (R26.89);Muscle weakness (generalized) (M62.81)    Time: 8460-8388 PT Time Calculation (min) (ACUTE ONLY): 32 min   Charges:   PT Evaluation $PT Eval Low Complexity: 1 Low PT Treatments $Gait Training: 8-22 mins PT General Charges $$ ACUTE PT VISIT: 1 Visit  Damien Caulk, PT 05/25/24, 4:27 PM

## 2024-05-25 NOTE — Plan of Care (Signed)

## 2024-05-26 DIAGNOSIS — I5033 Acute on chronic diastolic (congestive) heart failure: Secondary | ICD-10-CM | POA: Diagnosis not present

## 2024-05-26 LAB — CBC
HCT: 28.2 % — ABNORMAL LOW (ref 36.0–46.0)
Hemoglobin: 8.7 g/dL — ABNORMAL LOW (ref 12.0–15.0)
MCH: 23.6 pg — ABNORMAL LOW (ref 26.0–34.0)
MCHC: 30.9 g/dL (ref 30.0–36.0)
MCV: 76.6 fL — ABNORMAL LOW (ref 80.0–100.0)
Platelets: 154 K/uL (ref 150–400)
RBC: 3.68 MIL/uL — ABNORMAL LOW (ref 3.87–5.11)
RDW: 16.5 % — ABNORMAL HIGH (ref 11.5–15.5)
WBC: 4.6 K/uL (ref 4.0–10.5)
nRBC: 0 % (ref 0.0–0.2)

## 2024-05-26 LAB — BASIC METABOLIC PANEL WITH GFR
Anion gap: 10 (ref 5–15)
BUN: 13 mg/dL (ref 8–23)
CO2: 30 mmol/L (ref 22–32)
Calcium: 8 mg/dL — ABNORMAL LOW (ref 8.9–10.3)
Chloride: 90 mmol/L — ABNORMAL LOW (ref 98–111)
Creatinine, Ser: 0.95 mg/dL (ref 0.44–1.00)
GFR, Estimated: 60 mL/min — ABNORMAL LOW
Glucose, Bld: 94 mg/dL (ref 70–99)
Potassium: 3.3 mmol/L — ABNORMAL LOW (ref 3.5–5.1)
Sodium: 130 mmol/L — ABNORMAL LOW (ref 135–145)

## 2024-05-26 LAB — SODIUM: Sodium: 130 mmol/L — ABNORMAL LOW (ref 135–145)

## 2024-05-26 LAB — HEMOGLOBIN AND HEMATOCRIT, BLOOD
HCT: 29.8 % — ABNORMAL LOW (ref 36.0–46.0)
Hemoglobin: 9 g/dL — ABNORMAL LOW (ref 12.0–15.0)

## 2024-05-26 LAB — MAGNESIUM: Magnesium: 1.5 mg/dL — ABNORMAL LOW (ref 1.7–2.4)

## 2024-05-26 MED ORDER — POTASSIUM CHLORIDE CRYS ER 20 MEQ PO TBCR
40.0000 meq | EXTENDED_RELEASE_TABLET | Freq: Once | ORAL | Status: AC
  Administered 2024-05-26: 40 meq via ORAL
  Filled 2024-05-26: qty 2

## 2024-05-26 MED ORDER — MAGNESIUM SULFATE 2 GM/50ML IV SOLN
2.0000 g | Freq: Once | INTRAVENOUS | Status: AC
  Administered 2024-05-26: 2 g via INTRAVENOUS
  Filled 2024-05-26: qty 50

## 2024-05-26 MED ORDER — POTASSIUM CHLORIDE 20 MEQ PO PACK
40.0000 meq | PACK | Freq: Once | ORAL | Status: AC
  Administered 2024-05-26: 40 meq via ORAL
  Filled 2024-05-26: qty 2

## 2024-05-26 MED ORDER — POTASSIUM CHLORIDE 10 MEQ/100ML IV SOLN
10.0000 meq | INTRAVENOUS | Status: DC
  Administered 2024-05-26 (×2): 10 meq via INTRAVENOUS
  Filled 2024-05-26 (×4): qty 100

## 2024-05-26 MED ORDER — SODIUM CHLORIDE 0.9 % IV SOLN
12.5000 mg | Freq: Once | INTRAVENOUS | Status: AC
  Administered 2024-05-26: 12.5 mg via INTRAVENOUS
  Filled 2024-05-26: qty 12.5

## 2024-05-26 MED ORDER — METOPROLOL SUCCINATE ER 50 MG PO TB24
50.0000 mg | ORAL_TABLET | Freq: Every day | ORAL | Status: DC
  Administered 2024-05-26 – 2024-05-28 (×3): 50 mg via ORAL
  Filled 2024-05-26 (×3): qty 1

## 2024-05-26 NOTE — Progress Notes (Signed)
 While pt up ambulating to toilet she c/o dizziness. Pt on commode coughing, attempting to have BM, had 9 beat run vtach. Pt no longer dizzy at this time. Pt ambulated back to bed and sat on commode due to urgent BM. Pt c/o abdo pain, nausea, dizzy. Pt vomited around and became very lethargic. MD Fausto and cardiology advised. Pt states she is not done having a bm but continues being symptoimatic  Advised pt she may finish up in bed and we would help cleaning her up. Pt assisted back to bed x 2 staff. Pt able to stand and ambulate over. Greenish soft stool noted.

## 2024-05-26 NOTE — Evaluation (Signed)
 Occupational Therapy Evaluation Patient Details Name: Kendra Clarke MRN: [id number] DOB: June 21, 1941 Today's Date: 05/26/2024   History of Present Illness   Pt is an 83 y.o. female presenting to hospital 05/21/24 with c/o SOB progressively worsening x48 hours and several weeks of increasing weight gain and LE edema.  Pt admitted with acute on chronic diastolic CHF.  PMH includes sepsis, DM, htn, HLD, DM, COPD, large ventral hernia, superficial thrombophlebitis, CHF diastolic.     Clinical Impressions Pt was seen for OT evaluation this date. PTA, pt lives at home with her nephew and is able to manage ADLs and mobility on her own. Denies falls and reports use of SPC for outdoor mobility. Pt presents with deficits in strength and activity tolerance, affecting safe and optimal ADL completion. Pt currently requires MOD I for bed mobility and CGA for STS from EOB, ambulation to bathroom, and toilet transfer. Supervision to manage hygiene. CGA/SBA for LB dressing tasks to don mesh underwear. Ambulated ~115 ft with RW and CGA without LOB. Pt requesting to utilize bathroom again upon return to room, therefore left seated on [facility] with nurse present.  Pt would benefit from skilled OT services to address noted impairments and functional limitations to maximize safety and independence while minimizing future risk of falls, injury, and readmission. Do anticipate the need for follow up OT services upon acute hospital DC.      If plan is discharge home, recommend the following:   A little help with bathing/dressing/bathroom;A little help with walking and/or transfers;Assistance with cooking/housework;Help with stairs or ramp for entrance;Assist for transportation     Functional Status Assessment   Patient has had a recent decline in their functional status and demonstrates the ability to make significant improvements in function in a reasonable and predictable amount of time.     Equipment  Recommendations   None recommended by OT     Recommendations for Other Services         Precautions/Restrictions   Precautions Precautions: Fall Recall of Precautions/Restrictions: Intact Restrictions Weight Bearing Restrictions Per Provider Order: No     Mobility Bed Mobility Overal bed mobility: Needs Assistance Bed Mobility: Supine to Sit     Supine to sit: Modified independent (Device/Increase time), HOB elevated          Transfers Overall transfer level: Needs assistance Equipment used: Rolling walker (2 wheels) Transfers: Sit to/from Stand Sit to Stand: Contact guard assist           General transfer comment: able to stand from EOB, toilet and ambulate ~173ft with RW and CGA      Balance Overall balance assessment: Needs assistance Sitting-balance support: No upper extremity supported, Feet supported Sitting balance-Leahy Scale: Good     Standing balance support: Bilateral upper extremity supported, Reliant on assistive device for balance Standing balance-Leahy Scale: Good Standing balance comment: no LOB ambulating with RW use                           ADL either performed or assessed with clinical judgement   ADL Overall ADL's : Needs assistance/impaired     Grooming: Wash/dry hands;Standing;Contact guard assist;Supervision/safety               Lower Body Dressing: Supervision/safety;Sitting/lateral leans;Sit to/from stand;Contact guard assist   Toilet Transfer: Contact guard assist;Rolling walker (2 wheels);Regular Toilet;Grab bars   Toileting- Clothing Manipulation and Hygiene: Supervision/safety;Sitting/lateral lean       Functional mobility during  ADLs: Contact guard assist;Supervision/safety;Rolling walker (2 wheels)       Vision         Perception         Praxis         Pertinent Vitals/Pain Pain Assessment Pain Assessment: Faces Faces Pain Scale: Hurts a little bit Pain Location: abdominal  pain Pain Intervention(s): Monitored during session, Repositioned     Extremity/Trunk Assessment Upper Extremity Assessment Upper Extremity Assessment: Generalized weakness   Lower Extremity Assessment Lower Extremity Assessment: Generalized weakness   Cervical / Trunk Assessment Cervical / Trunk Assessment: Other exceptions Cervical / Trunk Exceptions: forward head/shoulders   Communication Communication Communication: No apparent difficulties   Cognition Arousal: Alert Behavior During Therapy: WFL for tasks assessed/performed                                 Following commands: Intact       Cueing  General Comments   Cueing Techniques: Verbal cues  c/o needing to have a BM and abdominal discomfort   Exercises Other Exercises Other Exercises: Edu on role of OT in acute setting   Shoulder Instructions      Home Living Family/patient expects to be discharged to:: Private residence Living Arrangements: Other relatives (Pt's nephew) Available Help at Discharge: Family;Available 24 hours/day Type of Home: Mobile home Home Access: Stairs to enter Entrance Stairs-Number of Steps: 5 Entrance Stairs-Rails: Right;Left;Can reach both Home Layout: One level     Bathroom Shower/Tub: Tub/shower unit;Sponge bathes at baseline   [employer]: Standard     Home Equipment: Agricultural consultant (2 wheels);Standard Walker;Cane - single point;Crutches;BSC/3in1;Shower seat;Grab bars - toilet;Grab bars - tub/shower          Prior Functioning/Environment Prior Level of Function : Independent/Modified Independent             Mobility Comments: Pt reports she and her nephew help each other as needed; no recent falls reported; no AD use in home; use SPC outside.      OT Problem List: Decreased strength;Decreased activity tolerance;Impaired balance (sitting and/or standing)   OT Treatment/Interventions: Self-care/ADL training;Therapeutic exercise;Patient/family  education;Balance training;Energy conservation;Therapeutic activities;DME and/or AE instruction      OT Goals(Current goals can be found in the care plan section)   Acute Rehab OT Goals Patient Stated Goal: go home OT Goal Formulation: With patient Time For Goal Achievement: 06/09/24 Potential to Achieve Goals: Good ADL Goals Pt Will Perform Lower Body Dressing: with modified independence;Independently;sit to/from stand;sitting/lateral leans Pt Will Transfer to Toilet: with modified independence;ambulating;Independently Additional ADL Goal #1: Pt will demo implementation of 1 learned ECS into ADL performance 2/2 trials to maximize safety and IND.   OT Frequency:  Min 2X/week    Co-evaluation              AM-PAC OT 6 Clicks Daily Activity     Outcome Measure Help from another person eating meals?: None Help from another person taking care of personal grooming?: None Help from another person toileting, which includes using toliet, bedpan, or urinal?: A Little Help from another person bathing (including washing, rinsing, drying)?: A Little Help from another person to put on and taking off regular upper body clothing?: None Help from another person to put on and taking off regular lower body clothing?: A Little 6 Click Score: 21   End of Session Equipment Utilized During Treatment: Gait belt;Rolling walker (2 wheels) Nurse Communication: Mobility status  Activity Tolerance: Patient tolerated treatment well Patient left: with nursing/sitter in room (seated on [facility])  OT Visit Diagnosis: Other abnormalities of gait and mobility (R26.89)                Time: 9059-9043 OT Time Calculation (min): 16 min Charges:  OT General Charges $OT Visit: 1 Visit OT Evaluation $OT Eval Low Complexity: 1 Low Aadith Raudenbush, OTR/L  05/26/24, 1:05 PM  Reha Martinovich E Kaneesha Constantino 05/26/2024, 1:00 PM

## 2024-05-26 NOTE — Progress Notes (Signed)
 Physical Therapy Treatment Patient Details Name: Kendra Clarke MRN: [id number] DOB: January 05, 1941 Today's Date: 05/26/2024   History of Present Illness Pt is an 83 y.o. female presenting to hospital 05/21/24 with c/o SOB progressively worsening x48 hours and several weeks of increasing weight gain and LE edema.  Pt admitted with acute on chronic diastolic CHF.  PMH includes sepsis, DM, htn, HLD, DM, COPD, large ventral hernia, superficial thrombophlebitis, CHF diastolic.    PT Comments  Therapist initially held therapy per nursing request (d/t medical concerns today). Pt's bed alarm was going off while therapist was nearby in hallway and pt noted to be sitting on end of bed trying to get up (pt reporting needing to walk to bathroom to toilet); BSC brought next to bed d/t pt reporting needing to toilet urgently and pt assisted to [facility] and (once finished toileting) then back to bed.  Pt's HR in upper 70's bpm during activities and SpO2 sats 98% on 2 L O2 via nasal cannula end of session at rest; nurse updated on pt's status and that pt went back to sleep shortly after laying back down in bed.  Bed alarm turned on and pt given phone and call light.   If plan is discharge home, recommend the following: A little help with walking and/or transfers;A little help with bathing/dressing/bathroom;Assistance with cooking/housework;Assist for transportation;Help with stairs or ramp for entrance   Can travel by private vehicle        Equipment Recommendations  Rolling walker (2 wheels)    Recommendations for Other Services       Precautions / Restrictions Precautions Precautions: Fall Recall of Precautions/Restrictions: Intact Restrictions Weight Bearing Restrictions Per Provider Order: No     Mobility  Bed Mobility Overal bed mobility: Needs Assistance Bed Mobility: Supine to Sit     Supine to sit: Modified independent (Device/Increase time), HOB elevated (not observed by therapist--pt already  sitting up on EOB upon therapist arrival to answer bed alarm) Sit to supine: Min assist   General bed mobility comments: assist for B LE's to lay back in bed; 2 assist to boost pt up in bed via bed pad    Transfers Overall transfer level: Needs assistance Equipment used: None Transfers: Bed to chair/wheelchair/BSC       Squat pivot transfers: Min assist     General transfer comment: min assist squat pivot bed to/from BSC; vc's for technique    Ambulation/Gait                   Stairs             Wheelchair Mobility     Tilt Bed    Modified Rankin (Stroke Patients Only)       Balance Overall balance assessment: Needs assistance Sitting-balance support: No upper extremity supported, Feet supported Sitting balance-Leahy Scale: Good Sitting balance - Comments: steady reaching within BOS                                    Communication Communication Communication: No apparent difficulties  Cognition Arousal: Alert (Quickly went back to bed (sleeping) once laying back in bed) Behavior During Therapy: [facility] for tasks assessed/performed   PT - Cognitive impairments: No apparent impairments                         Following commands: Intact  Cueing Cueing Techniques: Verbal cues  Exercises      General Comments General comments (skin integrity, edema, etc.): c/o needing to have a BM and abdominal discomfort.      Pertinent Vitals/Pain Pain Assessment Pain Assessment: Faces Faces Pain Scale: Hurts a little bit Pain Location: abdominal pain Pain Descriptors / Indicators: Discomfort Pain Intervention(s): Limited activity within patient's tolerance, Monitored during session, Repositioned    Home Living                          Prior Function            PT Goals (current goals can now be found in the care plan section) Acute Rehab PT Goals Patient Stated Goal: to improve strength and endurance PT Goal  Formulation: With patient Time For Goal Achievement: 06/08/24 Potential to Achieve Goals: Good Progress towards PT goals: Progressing toward goals    Frequency    Min 2X/week      PT Plan      Co-evaluation              AM-PAC PT 6 Clicks Mobility   Outcome Measure  Help needed turning from your back to your side while in a flat bed without using bedrails?: None Help needed moving from lying on your back to sitting on the side of a flat bed without using bedrails?: None Help needed moving to and from a bed to a chair (including a wheelchair)?: A Little Help needed standing up from a chair using your arms (e.g., wheelchair or bedside chair)?: A Little Help needed to walk in hospital room?: A Little Help needed climbing 3-5 steps with a railing? : A Little 6 Click Score: 20    End of Session Equipment Utilized During Treatment: Oxygen (2 L via nasal cannula) Activity Tolerance: Patient limited by fatigue Patient left: in bed;with call bell/phone within reach;with bed alarm set Nurse Communication: Mobility status;Precautions PT Visit Diagnosis: Other abnormalities of gait and mobility (R26.89);Muscle weakness (generalized) (M62.81)     Time: 8379-8366 PT Time Calculation (min) (ACUTE ONLY): 13 min  Charges:    $Therapeutic Activity: 8-22 mins PT General Charges $$ ACUTE PT VISIT: 1 Visit                     Damien Caulk, PT 05/26/24, 5:03 PM

## 2024-05-26 NOTE — Progress Notes (Signed)
 Progress Note   Patient: Kendra Clarke FMW:[id number] DOB: Jun 22, 1941 DOA: 05/21/2024     5 DOS: the patient was seen and examined on 05/26/2024   Brief hospital course: HPI on admission 05/21/2024: CARROLL RANNEY is a 83 y.o. female with medical history significant for COPD, asthma, osteoarthritis, type 2 diabetes mellitus and hypertension, presented to the emergency room with acute onset of worsening dyspnea with associated 4 pillow orthopnea and paroxysmal nocturnal dyspnea.  She admitted to worsening lower extremity edema as well as dyspnea on exertion lately.  No fever or chills.  She has been having dry cough and wheezing.  No dysuria, oliguria or hematuria or flank pain.  No fever or chills.  No nausea or vomiting or abdominal pain.  No bleeding diathesis.   Patient was admitted to medicine service with Cardiology consulted for further evaluation and management of acute on chronic diastolic CHF.    Patient is being treated with IV Lasix  and volume status is gradually improving.    Assessment and Plan:  * Acute on chronic diastolic CHF (congestive heart failure) (HCC) TTE 2024 LVEF of 55 to 60% with grade 2 diastolic dysfunction, mild aortic stenosis with mild calcification of the aortic valve and mild aortic regurgitation. Home meds: torsemide , lisinopril , and hydrochlorothiazide , and amlodipine  Patient reports she has difficulty getting her medications when she runs out.  Nephew who was previously helping her had a recent stroke.  Her son is in the [employer] per patient and can not help.  Dyspnea has improved since admission, continues to have orthopnea and BLE edema --Mgmt per Cardiology - appreciate recommendations --Continuing IV Lasix  per Cardiology --Toprol -XL, Farxiga , and losartan  --Started low dose spironolactone   --Echo pending since 9/5 -- report not in yet --Strict I/Os, daily weights  --TED hose   NSVT - 9 beat run of VT this AM on telemetry --Cardiology increased  metoprolol  --Maintain K>4, Mg>2 --Telemetry monitoring   Hyponatremia  - likely hypervolemic. Na improved 126 >> 130 today. --Diuresis as above --Monitor BMP   Hypokalemia - In the setting of diuresis.  Replaced --Monitor BMP, replace K PRN  Hypomagnesemia - Mg 1.5 --IV replacement ordered --Monitor & replace Mg PRN, goal > 2   Essential hypertension --mgmt as above --titrate regimen  --amlodipine  was stopped and low dose spironolactone  added for CHF  Type 2 diabetes mellitus with peripheral neuropathy Well controlled.  --Continue Neurontin   Microcytic anemia - Hemoglobin stable  --Continue iron  supplement   GERD without esophagitis - Will continue PPI therapy.  COPD Ashtma Not in exacerbation --Continue PRN ipratropium   Osteoarthritis --pain control PRN per orders      Subjective: Pt was sleeping but responded to voice this AM on rounds.  She had nausea and vomited earlier after meds were given, states nausea is better now.  No other acute complaints but felt short of breath earlier as well.     Physical Exam: Vitals:   05/26/24 0754 05/26/24 1027 05/26/24 1055 05/26/24 1116  BP: (!) 157/60 (!) 126/55 (!) 106/46 (!) 120/51  Pulse: 80 70 65 61  Resp: 18  14 (!) 21  Temp: 98.4 F (36.9 C)   97.6 F (36.4 C)  TempSrc:      SpO2: 95% 93% 92% 100%  Weight:      Height:       General exam: sleeping, responds to voice, no acute distress, obese HEENT: moist mucus membranes, hearing grossly normal  Respiratory system: CTAB diminished bases, no wheezes, rales  or rhonchi, normal respiratory effort. Cardiovascular system: normal S1/S2, RRR, 2/6 systolic murmur, BLE edema is improving (skin wrinkling noted to feet) Gastrointestinal system: soft, NT, ND Central nervous system: no gross focal neurologic deficits, normal speech Skin: dry, intact, normal temperature Psychiatry: normal mood, congruent affect, judgement and insight appear normal    Data  Reviewed:  Notable labs --  Na 128 >> 126 >> 130 K 3.3 Cl 90 Ca 8.0 Mg 1.5  Hbg 8.8 >> 8.7 stable   Family Communication: None present on rounds. Will attempt to call as time allows.  Disposition: Status is: Inpatient Remains inpatient appropriate because: remains on IV diuresis, electrolyte derangements, new onset vomiting this AM    Planned Discharge Destination: HH vs SNF    Time spent: 52 minutes including time at bedside and in coordination of care with staff and consultant/s.   Author: Burnard DELENA Cunning, DO 05/26/2024 2:44 PM  For on call review [email or web address].

## 2024-05-26 NOTE — Progress Notes (Signed)
 PT Cancellation Note  Patient Details Name: Kendra Clarke MRN: [id number] DOB: 11-10-1940   Cancelled Treatment:    Reason Eval/Treat Not Completed: Other (comment).  Chart reviewed.  Nurse recommending holding therapy at this time d/t medical concerns today (see nursing notes from today for details).  Will re-attempt PT session at a later date/time.  Damien Caulk, PT 05/26/24, 4:17 PM

## 2024-05-26 NOTE — Plan of Care (Signed)

## 2024-05-26 NOTE — Plan of Care (Signed)
°  Problem: Education: °Goal: Knowledge of General Education information will improve °Description: Including pain rating scale, medication(s)/side effects and non-pharmacologic comfort measures °Outcome: Progressing °  °Problem: Clinical Measurements: °Goal: Cardiovascular complication will be avoided °Outcome: Progressing °  °Problem: Activity: °Goal: Risk for activity intolerance will decrease °Outcome: Progressing °  °

## 2024-05-26 NOTE — Progress Notes (Cosign Needed Addendum)
 [facility] CLINIC CARDIOLOGY PROGRESS NOTE       [name] MRN: [id number] DOB/AGE: Mar 26, 1941 83 y.o.  Admit date: 05/21/2024 Referring Physician Dr. Lawence Primary Physician Donnie Handing, PA Primary Cardiologist None Reason for Consultation CHF  HPI: Kendra Clarke is a 83 y.o. female  with a past medical history of hypertension, type 2 diabetes, COPD, asthma, osteoarthritis who presented to the ED on 05/21/2024 for worsening dyspnea, lower extremity swelling and 4 pillow orthopnea.  Patient diagnosed with congestive heart failure in 2023 by [facility], however has not seen a cardiologist. Cardiology was consulted for further evaluation.   Interval History: -Patient seen and examined this AM and sitting comfortably in bedside chair with son at bedside. Patient states she continues to feels a lot better, improvement to SOB and LEE and denies chest pain, headedness/dizziness.  LEE improving overall, but 2+ LEE. -Patients BP elevated and HR stable this AM. Per tele had short 1x run NSVT.  -Yesterday UOP 3.35L, with  stable renal function. -Patient remains on room air with stable SpO2.    Review of systems complete and found to be negative unless listed above    Past Medical History:  Diagnosis Date   Arthritis    Asthma    COPD (chronic obstructive pulmonary disease) (HCC)    Diabetes mellitus without complication (HCC)    Hypertension    Liver disease    Umbilical hernia     Past Surgical History:  Procedure Laterality Date   ABDOMINAL HYSTERECTOMY     BLADDER SURGERY     CHOLECYSTECTOMY     ESOPHAGOGASTRODUODENOSCOPY N/A 09/03/2023   Procedure: ESOPHAGOGASTRODUODENOSCOPY (EGD);  Surgeon: Onita Elspeth Sharper, DO;  Location: [facility];  Service: Gastroenterology;  Laterality: N/A;   nose surgery      Medications Prior to Admission  Medication Sig Dispense Refill Last Dose/Taking   amLODipine  (NORVASC ) 10 MG tablet Take 10 mg by mouth daily.   Taking    ATROVENT  HFA 17 MCG/ACT inhaler Inhale into the lungs.   Taking   benzonatate  (TESSALON ) 200 MG capsule Take 1 capsule (200 mg total) by mouth 3 (three) times daily as needed for cough. 20 capsule 0 Taking As Needed   [Paused] Dulaglutide (TRULICITY) 0.75 MG/0.5ML SOPN Inject 0.75 mg into the skin once a week.   Taking   ergocalciferol  (VITAMIN D2) 1.25 MG (50000 UT) capsule Take 50,000 Units by mouth once a week.   Taking   hydrochlorothiazide  (MICROZIDE ) 12.5 MG capsule Take 12.5 mg by mouth daily.   Taking   lisinopril  (ZESTRIL ) 20 MG tablet Take 20 mg by mouth daily.   Taking   meloxicam  (MOBIC ) 15 MG tablet Take 15 mg by mouth daily.   Taking   pantoprazole  (PROTONIX ) 40 MG tablet Take 1 tablet (40 mg total) by mouth 2 (two) times daily before a meal. 30 tablet 1 Taking   potassium chloride  (KLOR-CON  M) 10 MEQ tablet Take 10 mEq by mouth 2 (two) times daily.   Taking   SYMBICORT  160-4.5 MCG/ACT inhaler INHALE 2 PUFFS TWICE DAILY FOR ASTHMA   Taking   torsemide  (DEMADEX ) 20 MG tablet Take 40 mg by mouth daily.   Taking   gabapentin (NEURONTIN) 100 MG capsule Take 100 mg by mouth 2 (two) times daily. (Patient not taking: Reported on 05/21/2024)   Not Taking   metoprolol  tartrate (LOPRESSOR ) 50 MG tablet Take 50 mg by mouth 2 (two) times daily. (Patient not taking: Reported on 05/21/2024)   Not  Taking   Social History   Socioeconomic History   Marital status: Widowed    Spouse name: Not on file   Number of children: Not on file   Years of education: Not on file   Highest education level: Not on file  Occupational History   Not on file  Tobacco Use   Smoking status: Never   Smokeless tobacco: Never  Vaping Use   Vaping status: Never Used  Substance and Sexual Activity   Alcohol use: No   Drug use: No   Sexual activity: Not on file  Other Topics Concern   Not on file  Social History Narrative   Not on file   Social Drivers of Health   Financial Resource Strain: Low Risk   (09/09/2023)   Received from [facility]   Overall Financial Resource Strain (CARDIA)    Difficulty of Paying Living Expenses: Not very hard  Food Insecurity: No Food Insecurity (05/23/2024)   Hunger Vital Sign    Worried About Running Out of Food in the Last Year: Never true    Ran Out of Food in the Last Year: Never true  Transportation Needs: No Transportation Needs (05/23/2024)   PRAPARE - Administrator, Civil Service (Medical): No    Lack of Transportation (Non-Medical): No  Physical Activity: Not on file  Stress: Not on file  Social Connections: Unknown (05/23/2024)   Social Connection and Isolation Panel    Frequency of Communication with Friends and Family: More than three times a week    Frequency of Social Gatherings with Friends and Family: More than three times a week    Attends Religious Services: Not on file    Active Member of Clubs or Organizations: Not on file    Attends Banker Meetings: Not on file    Marital Status: Not on file  Intimate Partner Violence: Not At Risk (05/23/2024)   Humiliation, Afraid, Rape, and Kick questionnaire    Fear of Current or Ex-Partner: No    Emotionally Abused: No    Physically Abused: No    Sexually Abused: No    Family History  Problem Relation Age of Onset   Multiple sclerosis Mother    Hypertension Father    COPD Father    Varicose Veins Maternal Aunt    Stroke Nephew      Vitals:   05/25/24 2348 05/26/24 0437 05/26/24 0517 05/26/24 0754  BP: 138/60 (!) 158/65  (!) 157/60  Pulse: 77 81  80  Resp: 20 18  18   Temp: 98.4 F (36.9 C) 98.5 F (36.9 C)  98.4 F (36.9 C)  TempSrc:      SpO2: 95% 97%  95%  Weight:   74.5 kg   Height:        PHYSICAL EXAM General: Chronically ill-appearing elderly female, well nourished, in no acute distress. HEENT: Normocephalic and atraumatic. Neck: No JVD.   Lungs: Normal respiratory effort on room air. CTAB Heart: HRR, elevated rate. Normal S1  and S2 without gallops or murmurs.  Abdomen: Non-distended appearing.  Msk: Normal strength and tone for age. Extremities: Warm and well perfused. No clubbing, cyanosis. 2+ pitting edema.  Neuro: Alert and oriented X 3. Psych: Answers questions appropriately.   Labs: Basic Metabolic Panel: Recent Labs    05/25/24 0358 05/26/24 0453  NA 126* 130*  K 3.6 3.3*  CL 89* 90*  CO2 29 30  GLUCOSE 94 94  BUN 13 13  CREATININE  0.96 0.95  CALCIUM  8.0* 8.0*  MG 1.7 1.5*   Liver Function Tests: No results for input(s): AST, ALT, ALKPHOS, BILITOT, PROT, ALBUMIN  in the last 72 hours.  No results for input(s): LIPASE, AMYLASE in the last 72 hours.  CBC: Recent Labs    05/25/24 0358 05/26/24 0453  WBC 4.8 4.6  HGB 8.8* 8.7*  HCT 28.6* 28.2*  MCV 76.9* 76.6*  PLT 139* 154   Cardiac Enzymes: Recent Labs    05/25/24 1130  TROPONINIHS 22*    BNP: No results for input(s): BNP in the last 72 hours.  D-Dimer: No results for input(s): DDIMER in the last 72 hours. Hemoglobin A1C: No results for input(s): HGBA1C in the last 72 hours. Fasting Lipid Panel: No results for input(s): CHOL, HDL, LDLCALC, TRIG, CHOLHDL, LDLDIRECT in the last 72 hours.  Thyroid Function Tests: No results for input(s): TSH, T4TOTAL, T3FREE, THYROIDAB in the last 72 hours.  Invalid input(s): FREET3 Anemia Panel: No results for input(s): VITAMINB12, FOLATE, FERRITIN, TIBC, IRON , RETICCTPCT in the last 72 hours.    Radiology: US  ARTERIAL ABI (SCREENING LOWER EXTREMITY) Result Date: 05/24/2024 CLINICAL DATA:  Lower extremity edema EXAM: NONINVASIVE PHYSIOLOGIC VASCULAR STUDY OF BILATERAL LOWER EXTREMITIES TECHNIQUE: Evaluation of both lower extremities were performed at rest, including calculation of ankle-brachial indices with single level Doppler, pressure and pulse volume recording. COMPARISON:  None Available. FINDINGS: Right ABI: Unable to calculate  due to elevated velocities at the ankle and the inability to perform a blood pressure in the right upper extremity due to indwelling IV. Left ABI:  Not calculated due to elevated velocities at the ankle. Right Lower Extremity:  Normal arterial waveforms at the ankle. Left Lower Extremity:  Normal arterial waveforms at the ankle. IMPRESSION: 1. Ankle brachial indices cannot be calculated due to noncompressible vessel calcifications (medial arterial sclerosis of Monckeberg), as well as the inability to perform a right upper extremity blood pressure due to indwelling IV. Electronically Signed   By: Ozell Daring M.D.   On: 05/24/2024 16:23   DG Chest Portable 1 View Result Date: 05/21/2024 CLINICAL DATA:  Shortness of breath EXAM: PORTABLE CHEST 1 VIEW COMPARISON:  Chest x-ray 08/27/2023 FINDINGS: The heart is enlarged. There are increased central interstitial markings bilaterally. There is a cluster of micro nodules in the right upper lobe. There is atelectatic change in both lung bases. There is no pleural effusion or pneumothorax. No acute fractures are seen. IMPRESSION: 1. Cardiomegaly with increased central interstitial markings bilaterally, likely pulmonary edema. 2. Cluster of micro nodules in the right upper lobe, likely infectious or inflammatory. Electronically Signed   By: Greig Pique M.D.   On: 05/21/2024 22:44    ECHO pending  TELEMETRY reviewed by me 05/26/2024: Sinus rhythm, rate 80s  EKG reviewed by me: sinus tachycardia, rate 113 bpm with RBBB, non-specific ST-T wave changes (similar to prior EKG).  Data reviewed by me 05/26/2024: last 24h vitals tele labs imaging I/O hospitalist progress notes.  Principal Problem:   Acute on chronic diastolic CHF (congestive heart failure) (HCC) Active Problems:   Essential hypertension   Type 2 diabetes mellitus with peripheral neuropathy (HCC)   GERD without esophagitis    ASSESSMENT AND PLAN:  Kendra Clarke is a 83 y.o. female  with a past  medical history of hypertension, type 2 diabetes, COPD, asthma, osteoarthritis who presented to the ED on 05/21/2024 for worsening dyspnea, lower extremity swelling and 4 pillow orthopnea.  Patient diagnosed with congestive heart failure in  2023 by [facility], however has not seen a cardiologist. Cardiology was consulted for further evaluation.   # Acute on chronic HFpEF # COPD BNP elevated at 1700. CXR with cardiomegaly and pulmonary vascular congestions. - Echo ordered.  Further recommendations pending results. - Continue IV Lasix  to 60 mg twice daily. Likely transition to PO tomorrow. (Home doses torsemide  40 mg daily.) - Closely monitor UOP and renal function. - Continue dapagliflozin  10 mg daily. (Copay $0) - Continue spironolactone  25 mg daily.  # Ventricular Ectopy Per tele on 09/09, had 9 beat run NSVTx1.  - Monitor and replenish electrolytes for a goal K >4, Mag >2  - Increased metoprolol  succinate to 50 mg daily.  - Continue to closely monitor tele.   # Hypertension # Hyperlipidemia Patient without chest pain.  EKG with non-specific ST-T wave changes (similar to prior EKG). Troponins minimally elevated and flat 60 > 60.  Lipid panel this admission within normal limits, LDL 80.  - Continue losartan  50 mg daily. - Continue metoprolol  as stated above.  - Continue atorvastatin  40 mg daily. - Minimally elevated and flat is most consistent with demand/supply mismatch and not ACS.  This patient's plan of care was discussed and created with Dr. Ammon and he is in agreement.  Signed: Dorene Comfort, PA-C  05/26/2024, 8:47 AM [facility]

## 2024-05-26 NOTE — Progress Notes (Signed)
 Pt c/o nausea attempting to vomit, co dizziness, abdo pain. Cardiology and Dr. Fausto advised. Phenergan  IVPB and NA level ordered

## 2024-05-26 NOTE — Progress Notes (Signed)
 Pt c/o shortness of breath placed on 2lnc by charge. Cardiologist and Dr. Fausto aware

## 2024-05-27 DIAGNOSIS — I5033 Acute on chronic diastolic (congestive) heart failure: Secondary | ICD-10-CM | POA: Diagnosis not present

## 2024-05-27 LAB — CBC
HCT: 28.7 % — ABNORMAL LOW (ref 36.0–46.0)
Hemoglobin: 8.9 g/dL — ABNORMAL LOW (ref 12.0–15.0)
MCH: 24.4 pg — ABNORMAL LOW (ref 26.0–34.0)
MCHC: 31 g/dL (ref 30.0–36.0)
MCV: 78.6 fL — ABNORMAL LOW (ref 80.0–100.0)
Platelets: 164 K/uL (ref 150–400)
RBC: 3.65 MIL/uL — ABNORMAL LOW (ref 3.87–5.11)
RDW: 16.7 % — ABNORMAL HIGH (ref 11.5–15.5)
WBC: 4.6 K/uL (ref 4.0–10.5)
nRBC: 0 % (ref 0.0–0.2)

## 2024-05-27 LAB — MAGNESIUM: Magnesium: 1.9 mg/dL (ref 1.7–2.4)

## 2024-05-27 LAB — ECHOCARDIOGRAM COMPLETE
AR max vel: 1.53 cm2
AV Area VTI: 1.53 cm2
AV Area mean vel: 1.49 cm2
AV Mean grad: 12.5 mmHg
AV Peak grad: 22.3 mmHg
Ao pk vel: 2.36 m/s
Area-P 1/2: 5.5 cm2
Height: 64 in
MV VTI: 2.14 cm2
S' Lateral: 3.48 cm
Weight: 3506.2 [oz_av]

## 2024-05-27 LAB — BASIC METABOLIC PANEL WITH GFR
Anion gap: 8 (ref 5–15)
BUN: 13 mg/dL (ref 8–23)
CO2: 30 mmol/L (ref 22–32)
Calcium: 8.4 mg/dL — ABNORMAL LOW (ref 8.9–10.3)
Chloride: 96 mmol/L — ABNORMAL LOW (ref 98–111)
Creatinine, Ser: 1.09 mg/dL — ABNORMAL HIGH (ref 0.44–1.00)
GFR, Estimated: 51 mL/min — ABNORMAL LOW (ref >60)
Glucose, Bld: 87 mg/dL (ref 70–99)
Potassium: 3.7 mmol/L (ref 3.5–5.1)
Sodium: 134 mmol/L — ABNORMAL LOW (ref 135–145)

## 2024-05-27 MED ORDER — ENOXAPARIN SODIUM 40 MG/0.4ML IJ SOSY
40.0000 mg | PREFILLED_SYRINGE | INTRAMUSCULAR | Status: DC
  Administered 2024-05-28: 40 mg via SUBCUTANEOUS
  Filled 2024-05-27: qty 0.4

## 2024-05-27 MED ORDER — BISACODYL 10 MG RE SUPP
10.0000 mg | Freq: Once | RECTAL | Status: AC
  Administered 2024-05-27: 10 mg via RECTAL
  Filled 2024-05-27: qty 1

## 2024-05-27 MED ORDER — TORSEMIDE 20 MG PO TABS
40.0000 mg | ORAL_TABLET | Freq: Every day | ORAL | Status: DC
  Administered 2024-05-27: 40 mg via ORAL
  Filled 2024-05-27: qty 2

## 2024-05-27 MED ORDER — MAGNESIUM SULFATE 2 GM/50ML IV SOLN
2.0000 g | Freq: Once | INTRAVENOUS | Status: AC
Start: 2024-05-27 — End: 2024-05-28
  Administered 2024-05-27: 2 g via INTRAVENOUS
  Filled 2024-05-27: qty 50

## 2024-05-27 NOTE — Plan of Care (Signed)
  Problem: Education: Goal: Knowledge of General Education information will improve Description: Including pain rating scale, medication(s)/side effects and non-pharmacologic comfort measures Outcome: Progressing   Problem: Clinical Measurements: Goal: Ability to maintain clinical measurements within normal limits will improve Outcome: Progressing Goal: Diagnostic test results will improve Outcome: Progressing Goal: Respiratory complications will improve Outcome: Progressing Goal: Cardiovascular complication will be avoided Outcome: Progressing   Problem: Activity: Goal: Risk for activity intolerance will decrease Outcome: Progressing   Problem: Nutrition: Goal: Adequate nutrition will be maintained Outcome: Progressing   Problem: Coping: Goal: Level of anxiety will decrease Outcome: Progressing   Problem: Elimination: Goal: Will not experience complications related to bowel motility Outcome: Progressing Goal: Will not experience complications related to urinary retention Outcome: Progressing   Problem: Safety: Goal: Ability to remain free from injury will improve Outcome: Progressing

## 2024-05-27 NOTE — Plan of Care (Signed)

## 2024-05-27 NOTE — Progress Notes (Signed)
 Progress Note Patient: Kendra Clarke FMW:[id number] DOB: Sep 15, 1941 DOA: 05/21/2024     6 DOS: the patient was seen and examined on 05/27/2024   Brief hospital course: Kendra Clarke is a 83 y.o. female with medical history significant for COPD, asthma, osteoarthritis, type 2 diabetes mellitus and hypertension, presented to the emergency room with acute onset of worsening dyspnea with associated 4 pillow orthopnea and paroxysmal nocturnal dyspnea.  Patient was admitted to medicine service with Cardiology consulted for further evaluation and management of acute on chronic diastolic CHF.    Patient is being treated with IV Lasix  and volume status is gradually improving.  Assessment and Plan: Acute on chronic diastolic CHF (congestive heart failure) (HCC) TTE 9/5: EF 60-65%, normal LV function and RV. Mild to mod MVR.  Home meds: torsemide , lisinopril , and hydrochlorothiazide , and amlodipine . Poor compliance Dyspnea has improved since admission, continues to have orthopnea and BLE edema --Mgmt per Cardiology - appreciate recommendations. Transitioning to PO diuretics today.  --Toprol -XL, Farxiga , and losartan  --Started low dose spironolactone   --Echo pending since 9/5 -- report not in yet --Strict I/Os, daily weights  --TED hose   NSVT - no further episodes. Asymptomatic  --Cardiology increased metoprolol  --Maintain K>4, Mg>2 --Telemetry monitoring   Hyponatremia  - likely hypervolemic. Na improved 126 >> 134 today. --Diuresis as above --Monitor BMP   Hypokalemia - resolved --Monitor BMP, replace K PRN  Hypomagnesemia - Mg 1.9 --IV replacement ordered --Monitor & replace Mg PRN, goal > 2   Essential hypertension --mgmt as above --titrate regimen  --amlodipine  was stopped and low dose spironolactone  added for CHF  Type 2 diabetes mellitus with peripheral neuropathy Well controlled.  --Continue Neurontin   Microcytic anemia - Hemoglobin stable  --Continue iron  supplement    GERD without esophagitis - Will continue PPI therapy.  COPD Ashtma Not in exacerbation --Continue PRN ipratropium   Osteoarthritis --pain control PRN per orders      Subjective: Kendra Clarke reports great improvement in her overall status. She still has LE swelling but thinks this is close to her baseline swelling. Has compression stockings at home but says they are too small to get on.  Physical Exam: Vitals:   05/27/24 0016 05/27/24 0059 05/27/24 0343 05/27/24 0535  BP: (!) 164/74 136/72 (!) 149/59   Pulse: 82  79   Resp: 20  18   Temp: 98.4 F (36.9 C)  97.9 F (36.6 C)   TempSrc:      SpO2: 94%  94%   Weight:    72.4 kg  Height:       General exam: alert, NAD HEENT: moist mucus membranes, hearing grossly normal  Respiratory system: CTAB diminished bases, no wheezes, rales or rhonchi, normal respiratory effort. Cardiovascular system: normal S1/S2, RRR, 2/6 systolic murmur, BLE edema still 2+ pitting on bilateral ankles and tender to palpation. No tightness to skin  Gastrointestinal system: soft, NT, ND Central nervous system: no gross focal neurologic deficits, normal speech Skin: dry, intact, normal temperature Psychiatry: normal mood, congruent affect, judgement and insight appear normal  Data Reviewed:    Latest Ref Rng & Units 05/27/2024    4:39 AM 05/26/2024    4:21 PM 05/26/2024    4:53 AM  BMP  Glucose 70 - 99 mg/dL 87   94   BUN 8 - 23 mg/dL 13   13   Creatinine 9.55 - 1.00 mg/dL 8.90   9.04   Sodium 864 - 145 mmol/L 134  130  130  Potassium 3.5 - 5.1 mmol/L 3.7   3.3   Chloride 98 - 111 mmol/L 96   90   CO2 22 - 32 mmol/L 30   30   Calcium  8.9 - 10.3 mg/dL 8.4   8.0       Latest Ref Rng & Units 05/27/2024    4:39 AM 05/26/2024    4:21 PM 05/26/2024    4:53 AM  CBC  WBC 4.0 - 10.5 K/uL 4.6   4.6   Hemoglobin 12.0 - 15.0 g/dL 8.9  9.0  8.7   Hematocrit 36.0 - 46.0 % 28.7  29.8  28.2   Platelets 150 - 400 K/uL 164   154    Family Communication: None present  on rounds. Will attempt to call as time allows.  Disposition: Status is: Inpatient Remains inpatient appropriate because: remains on IV diuresis, electrolyte derangements, cardiology following    Planned Discharge Destination: HH vs SNF    Time spent: 50 minutes including time at bedside and in coordination of care with staff and consultant/s.   Author: Marien LITTIE Piety, MD 05/27/2024 7:50 AM  For on call review [email or web address].

## 2024-05-27 NOTE — Progress Notes (Signed)
 Physical Therapy Treatment Patient Details Name: Kendra Clarke MRN: [id number] DOB: [birth year] Today's Date: 05/27/2024   History of Present Illness Pt is an 83 y.o. female presenting to hospital 05/21/24 with c/o SOB progressively worsening x48 hours and several weeks of increasing weight gain and LE edema.  Pt admitted with acute on chronic diastolic CHF.  PMH includes sepsis, DM, htn, HLD, DM, COPD, large ventral hernia, superficial thrombophlebitis, CHF diastolic.    PT Comments  Pt pleasant and willing to work with PT despite endorsing some fatigue; wanting to get back in bed post session for nap, rather than up in recliner for lunch.  Pt was able to ambulate ~350 ft with light walker use, up/down steps with good safety and confidence.  Pt will benefit from ongoing PT, continue with POC.    If plan is discharge home, recommend the following: A little help with walking and/or transfers;A little help with bathing/dressing/bathroom;Assistance with cooking/housework;Assist for transportation;Help with stairs or ramp for entrance   Can travel by private vehicle        Equipment Recommendations  Rolling walker (2 wheels)    Recommendations for Other Services       Precautions / Restrictions Precautions Precautions: Fall (mod) Recall of Precautions/Restrictions: Intact Restrictions Weight Bearing Restrictions Per Provider Order: No     Mobility  Bed Mobility Overal bed mobility: Needs Assistance Bed Mobility: Supine to Sit     Supine to sit: HOB elevated, Supervision Sit to supine: Supervision   General bed mobility comments: slow to get toward EOB, but did so w/o assist, similar getting back up into bed/supine    Transfers Overall transfer level: Modified independent Equipment used: Rolling walker (2 wheels) Transfers: Sit to/from Stand Sit to Stand: Contact guard assist           General transfer comment: able to rise to standing w/o assist, good confidence with  light UE use    Ambulation/Gait Ambulation/Gait assistance: Contact guard assist Gait Distance (Feet): 350 Feet Assistive device: Rolling walker (2 wheels)         General Gait Details: steady ambulation with RW use, able to maintain confidence and consistent cadence, minimal fatigue.   Stairs Stairs: Yes Stairs assistance: Supervision Stair Management: Two rails Number of Stairs: 6 General stair comments: Pt was able to negotiate steps w/o issue, showed good confidence and safety   Wheelchair Mobility     Tilt Bed    Modified Rankin (Stroke Patients Only)       Balance Overall balance assessment: Needs assistance Sitting-balance support: No upper extremity supported, Feet supported Sitting balance-Leahy Scale: Good     Standing balance support: Bilateral upper extremity supported, Reliant on assistive device for balance Standing balance-Leahy Scale: Good Standing balance comment: no LOB ambulating with RW use                            Communication Communication Communication: No apparent difficulties  Cognition Arousal: Alert Behavior During Therapy: WFL for tasks assessed/performed   PT - Cognitive impairments: No apparent impairments                         Following commands: Intact      Cueing Cueing Techniques: Verbal cues  Exercises      General Comments        Pertinent Vitals/Pain Pain Assessment Pain Assessment: Faces Faces Pain Scale: Hurts a little bit Pain Location:  back is sore from being in bed    Home Living                          Prior Function            PT Goals (current goals can now be found in the care plan section) Progress towards PT goals: Progressing toward goals    Frequency    Min 2X/week      PT Plan      Co-evaluation              AM-PAC PT 6 Clicks Mobility   Outcome Measure  Help needed turning from your back to your side while in a flat bed without  using bedrails?: None Help needed moving from lying on your back to sitting on the side of a flat bed without using bedrails?: None Help needed moving to and from a bed to a chair (including a wheelchair)?: None Help needed standing up from a chair using your arms (e.g., wheelchair or bedside chair)?: None Help needed to walk in hospital room?: A Little Help needed climbing 3-5 steps with a railing? : A Little 6 Click Score: 22    End of Session   Activity Tolerance: Patient tolerated treatment well Patient left: in bed;with call bell/phone within reach;with bed alarm set Nurse Communication: Mobility status PT Visit Diagnosis: Other abnormalities of gait and mobility (R26.89);Muscle weakness (generalized) (M62.81)     Time: 8883-8864 PT Time Calculation (min) (ACUTE ONLY): 19 min  Charges:    $Gait Training: 8-22 mins PT General Charges $$ ACUTE PT VISIT: 1 Visit                     Carmin JONELLE Deed, DPT 05/27/2024, 1:19 PM

## 2024-05-27 NOTE — Progress Notes (Signed)
 [facility] C[facility] PROGRESS NOTE       [name] MRN: [id number] DOB/AGE: 10-28-1940 83 y.o.  Admit date: 05/21/2024 Referring Physician Dr. Lawence Primary Physician Donnie Handing, PA Primary Cardiologist None Reason for Consultation CHF  HPI: Kendra Clarke is a 83 y.o. female  with a past medical history of hypertension, type 2 diabetes, COPD, asthma, osteoarthritis who presented to the ED on 05/21/2024 for worsening dyspnea, lower extremity swelling and 4 pillow orthopnea.  [name], however has not seen a cardiologist. Cardiology was consulted for further evaluation.   Interval History: -Patient seen and examined this AM and laying comfortably in hospital bed. Patient is very tired but arousable.  [name], [name]. Recommend wrapping legs. Lungs CTAB.  -Patients BP and HR stable this AM. Per tele no more recurrence of NSVT. -Yesterday UOP decreased 1.1L, with slight bump in Cr. Na level improved 130 > 134. -Patient remains on room air with stable SpO2.    Review of systems complete and found to be negative unless listed above    Past Medical History:  Diagnosis Date   Arthritis    Asthma    COPD (chronic obstructive pulmonary disease) (HCC)    Diabetes mellitus without complication (HCC)    Hypertension    Liver disease    Umbilical hernia     Past Surgical History:  Procedure Laterality Date   ABDOMINAL HYSTERECTOMY     BLADDER SURGERY     CHOLECYSTECTOMY     ESOPHAGOGASTRODUODENOSCOPY N/A 09/03/2023   Procedure: ESOPHAGOGASTRODUODENOSCOPY (EGD);  Surgeon: Onita Elspeth Sharper, DO;  Location: [facility] ENDOSCOPY;  Service: Gastroenterology;  Laterality: N/A;   nose surgery      Medications Prior to Admission  Medication Sig Dispense Refill Last Dose/Taking   amLODipine  (NORVASC ) 10 MG tablet Take 10 mg by mouth daily.   Taking   ATROVENT  HFA 17 MCG/ACT inhaler Inhale into  the lungs.   Taking   benzonatate  (TESSALON ) 200 MG capsule Take 1 capsule (200 mg total) by mouth 3 (three) times daily as needed for cough. 20 capsule 0 Taking As Needed   [Paused] Dulaglutide (TRULICITY) 0.75 MG/0.5ML SOPN Inject 0.75 mg into the skin once a week.   Taking   ergocalciferol  (VITAMIN D2) 1.25 MG (50000 UT) capsule Take 50,000 Units by mouth once a week.   Taking   hydrochlorothiazide  (MICROZIDE ) 12.5 MG capsule Take 12.5 mg by mouth daily.   Taking   lisinopril  (ZESTRIL ) 20 MG tablet Take 20 mg by mouth daily.   Taking   meloxicam  (MOBIC ) 15 MG tablet Take 15 mg by mouth daily.   Taking   pantoprazole  (PROTONIX ) 40 MG tablet Take 1 tablet (40 mg total) by mouth 2 (two) times daily before a meal. 30 tablet 1 Taking   potassium chloride  (KLOR-CON  M) 10 MEQ tablet Take 10 mEq by mouth 2 (two) times daily.   Taking   SYMBICORT  160-4.5 MCG/ACT inhaler INHALE 2 PUFFS TWICE DAILY FOR ASTHMA   Taking   torsemide  (DEMADEX ) 20 MG tablet Take 40 mg by mouth daily.   Taking   gabapentin (NEURONTIN) 100 MG capsule Take 100 mg by mouth 2 (two) times daily. (Patient not taking: Reported on 05/21/2024)   Not Taking   metoprolol  tartrate (LOPRESSOR ) 50 MG tablet Take 50 mg by mouth 2 (two) times daily. (Patient not taking: Reported on 05/21/2024)   Not Taking   Social History  Socioeconomic History   Marital status: Widowed    Spouse name: Not on file   Number of children: Not on file   Years of education: Not on file   Highest education level: Not on file  Occupational History   Not on file  Tobacco Use   Smoking status: Never   Smokeless tobacco: Never  Vaping Use   Vaping status: Never Used  Substance and Sexual Activity   Alcohol use: No   Drug use: No   Sexual activity: Not on file  Other Topics Concern   Not on file  Social History Narrative   Not on file   Social Drivers of Health   Financial Resource Strain: Low Risk  (09/09/2023)   Received from [facility]   Overall Financial Resource Strain (CARDIA)    Difficulty of Paying Living Expenses: Not very hard  Food Insecurity: No Food Insecurity (05/23/2024)   Hunger Vital Sign    Worried About Running Out of Food in the Last Year: Never true    Ran Out of Food in the Last Year: Never true  Transportation Needs: No Transportation Needs (05/23/2024)   PRAPARE - Administrator, Civil Service (Medical): No    Lack of Transportation (Non-Medical): No  Physical Activity: Not on file  Stress: Not on file  Social Connections: Unknown (05/23/2024)   Social Connection and Isolation Panel    Frequency of Communication with Friends and Family: More than three times a week    Frequency of Social Gatherings with Friends and Family: More than three times a week    Attends Religious Services: Not on file    Active Member of Clubs or Organizations: Not on file    Attends Banker Meetings: Not on file    Marital Status: Not on file  Intimate Partner Violence: Not At Risk (05/23/2024)   Humiliation, Afraid, Rape, and Kick questionnaire    Fear of Current or Ex-Partner: No    Emotionally Abused: No    Physically Abused: No    Sexually Abused: No    Family History  Problem Relation Age of Onset   Multiple sclerosis Mother    Hypertension Father    COPD Father    Varicose Veins Maternal Aunt    Stroke Nephew      Vitals:   05/27/24 0059 05/27/24 0343 05/27/24 0535 05/27/24 0800  BP: 136/72 (!) 149/59  (!) 141/59  Pulse:  79  74  Resp:  18  18  Temp:  97.9 F (36.6 C)  (!) 97.5 F (36.4 C)  TempSrc:      SpO2:  94%  91%  Weight:   72.4 kg   Height:        PHYSICAL EXAM General: Chronically ill-appearing elderly female, well nourished, in no acute distress. HEENT: Normocephalic and atraumatic. Neck: No JVD.   Lungs: Normal respiratory effort on room air. CTAB Heart: HRR, elevated rate. Normal S1 and S2 without gallops or murmurs.  Abdomen: Non-distended appearing.   Msk: Normal strength and tone for age. Extremities: Warm and well perfused. No clubbing, cyanosis. 1+ pitting edema.  Neuro: Alert and oriented X 3. Psych: Answers questions appropriately.   Labs: Basic Metabolic Panel: Recent Labs    05/26/24 0453 05/26/24 1621 05/27/24 0439  NA 130* 130* 134*  K 3.3*  --  3.7  CL 90*  --  96*  CO2 30  --  30  GLUCOSE 94  --  87  BUN 13  --  13  CREATININE 0.95  --  1.09*  CALCIUM  8.0*  --  8.4*  MG 1.5*  --  1.9   Liver Function Tests: No results for input(s): AST, ALT, ALKPHOS, BILITOT, PROT, ALBUMIN  in the last 72 hours.  No results for input(s): LIPASE, AMYLASE in the last 72 hours.  CBC: Recent Labs    05/26/24 0453 05/26/24 1621 05/27/24 0439  WBC 4.6  --  4.6  HGB 8.7* 9.0* 8.9*  HCT 28.2* 29.8* 28.7*  MCV 76.6*  --  78.6*  PLT 154  --  164   Cardiac Enzymes: Recent Labs    05/25/24 1130  TROPONINIHS 22*    BNP: No results for input(s): BNP in the last 72 hours.  D-Dimer: No results for input(s): DDIMER in the last 72 hours. Hemoglobin A1C: No results for input(s): HGBA1C in the last 72 hours. Fasting Lipid Panel: No results for input(s): CHOL, HDL, LDLCALC, TRIG, CHOLHDL, LDLDIRECT in the last 72 hours.  Thyroid Function Tests: No results for input(s): TSH, T4TOTAL, T3FREE, THYROIDAB in the last 72 hours.  Invalid input(s): FREET3 Anemia Panel: No results for input(s): VITAMINB12, FOLATE, FERRITIN, TIBC, IRON , RETICCTPCT in the last 72 hours.    Radiology: US  ARTERIAL ABI (SCREENING LOWER EXTREMITY) Result Date: 05/24/2024 CLINICAL DATA:  Lower extremity edema EXAM: NONINVASIVE PHYSIOLOGIC VASCULAR STUDY OF BILATERAL LOWER EXTREMITIES TECHNIQUE: Evaluation of both lower extremities were performed at rest, including calculation of ankle-brachial indices with single level Doppler, pressure and pulse volume recording. COMPARISON:  None Available. FINDINGS:  Right ABI: Unable to calculate due to elevated velocities at the ankle and the inability to perform a blood pressure in the right upper extremity due to indwelling IV. Left ABI:  Not calculated due to elevated velocities at the ankle. Right Lower Extremity:  Normal arterial waveforms at the ankle. Left Lower Extremity:  Normal arterial waveforms at the ankle. IMPRESSION: 1. Ankle brachial indices cannot be calculated due to noncompressible vessel calcifications (medial arterial sclerosis of Monckeberg), as well as the inability to perform a right upper extremity blood pressure due to indwelling IV. Electronically Signed   By: Ozell Daring M.D.   On: 05/24/2024 16:23   DG Chest Portable 1 View Result Date: 05/21/2024 CLINICAL DATA:  Shortness of breath EXAM: PORTABLE CHEST 1 VIEW COMPARISON:  Chest x-ray 08/27/2023 FINDINGS: The heart is enlarged. There are increased central interstitial markings bilaterally. There is a cluster of micro nodules in the right upper lobe. There is atelectatic change in both lung bases. There is no pleural effusion or pneumothorax. No acute fractures are seen. IMPRESSION: 1. Cardiomegaly with increased central interstitial markings bilaterally, likely pulmonary edema. 2. Cluster of micro nodules in the right upper lobe, likely infectious or inflammatory. Electronically Signed   By: Greig Pique M.D.   On: 05/21/2024 22:44    ECHO pending  TELEMETRY reviewed by me 05/27/2024: Sinus rhythm, rate 70s  EKG reviewed by me: sinus tachycardia, rate 113 bpm with RBBB, non-specific ST-T wave changes (similar to prior EKG).  Data reviewed by me 05/27/2024: last 24h vitals tele labs imaging I/O hospitalist progress notes.  Principal Problem:   Acute on chronic diastolic CHF (congestive heart failure) (HCC) Active Problems:   Essential hypertension   Type 2 diabetes mellitus with peripheral neuropathy (HCC)   GERD without esophagitis    ASSESSMENT AND PLAN:  DETRICE CALES is  a 83 y.o. female  with a past medical history of hypertension, type  2 diabetes, COPD, asthma, osteoarthritis who presented to the ED on 05/21/2024 for worsening dyspnea, lower extremity swelling and 4 pillow orthopnea.  Patient diagnosed with congestive heart failure in 2023 by [facility], however has not seen a cardiologist. Cardiology was consulted for further evaluation.   # Acute on chronic HFpEF # COPD BNP elevated at 1700. CXR with cardiomegaly and pulmonary vascular congestions. - Echo ordered.  Further recommendations pending results. - Transition IV Lasix  to home PO torsemide  40 mg daily.  - Closely monitor UOP and renal function. - Continue dapagliflozin  10 mg daily. (Copay $0) - Continue spironolactone  25 mg daily.  # Ventricular Ectopy Per tele on 09/09, had 9 beat run NSVTx1. No further recurrence of NSVT. - Monitor and replenish electrolytes for a goal K >4, Mag >2  - Continue metoprolol  succinate to 50 mg daily.  - Continue to closely monitor tele.   # Hypertension # Hyperlipidemia Patient without chest pain.  EKG with non-specific ST-T wave changes (similar to prior EKG). Troponins minimally elevated and flat 60 > 60.  Lipid panel this admission within normal limits, LDL 80.  - Continue losartan  50 mg daily. - Continue metoprolol  as stated above.  - Continue atorvastatin  40 mg daily. - Minimally elevated and flat is most consistent with demand/supply mismatch and not ACS.  This patient's plan of care was discussed and created with Dr. Ammon and he is in agreement.  Signed: Dorene Comfort, PA-C  05/27/2024, 8:22 AM [facility]

## 2024-05-28 DIAGNOSIS — I5033 Acute on chronic diastolic (congestive) heart failure: Secondary | ICD-10-CM | POA: Diagnosis not present

## 2024-05-28 DIAGNOSIS — E8770 Fluid overload, unspecified: Secondary | ICD-10-CM

## 2024-05-28 LAB — BASIC METABOLIC PANEL WITH GFR
Anion gap: 7 (ref 5–15)
BUN: 15 mg/dL (ref 8–23)
CO2: 29 mmol/L (ref 22–32)
Calcium: 7.9 mg/dL — ABNORMAL LOW (ref 8.9–10.3)
Chloride: 94 mmol/L — ABNORMAL LOW (ref 98–111)
Creatinine, Ser: 1.16 mg/dL — ABNORMAL HIGH (ref 0.44–1.00)
GFR, Estimated: 47 mL/min — ABNORMAL LOW (ref >60)
Glucose, Bld: 141 mg/dL — ABNORMAL HIGH (ref 70–99)
Potassium: 3.8 mmol/L (ref 3.5–5.1)
Sodium: 130 mmol/L — ABNORMAL LOW (ref 135–145)

## 2024-05-28 MED ORDER — LOSARTAN POTASSIUM 50 MG PO TABS: 50.0000 mg | ORAL_TABLET | Freq: Every day | ORAL | 0 refills | Status: AC

## 2024-05-28 MED ORDER — DAPAGLIFLOZIN PROPANEDIOL 10 MG PO TABS: 10.0000 mg | ORAL_TABLET | Freq: Every day | ORAL | 0 refills | Status: AC

## 2024-05-28 MED ORDER — MELOXICAM 15 MG PO TABS: 15.0000 mg | ORAL_TABLET | Freq: Every day | ORAL | Status: AC | PRN

## 2024-05-28 MED ORDER — SPIRONOLACTONE 25 MG PO TABS: 25.0000 mg | ORAL_TABLET | Freq: Every day | ORAL | 0 refills | Status: AC

## 2024-05-28 MED ORDER — ATORVASTATIN CALCIUM 40 MG PO TABS: 40.0000 mg | ORAL_TABLET | Freq: Every day | ORAL | 0 refills | Status: AC

## 2024-05-28 MED ORDER — TORSEMIDE 20 MG PO TABS: 20.0000 mg | ORAL_TABLET | Freq: Every day | ORAL | 0 refills | Status: AC

## 2024-05-28 MED ORDER — TORSEMIDE 20 MG PO TABS
20.0000 mg | ORAL_TABLET | Freq: Every day | ORAL | Status: DC
  Administered 2024-05-28: 20 mg via ORAL
  Filled 2024-05-28: qty 1

## 2024-05-28 MED ORDER — METOPROLOL SUCCINATE ER 50 MG PO TB24: 50.0000 mg | ORAL_TABLET | Freq: Every day | ORAL | 0 refills | Status: AC

## 2024-05-28 NOTE — Plan of Care (Signed)

## 2024-05-28 NOTE — Progress Notes (Incomplete)
 Progress Note Patient: Kendra Clarke FMW:[id number] DOB: [birth year] DOA: 05/21/2024     7 DOS: the patient was seen and examined on 05/28/2024   Brief hospital course: Kendra Clarke is a 83 y.o. female with medical history significant for COPD, asthma, osteoarthritis, type 2 diabetes mellitus and hypertension, presented to the emergency room with acute onset of worsening dyspnea with associated 4 pillow orthopnea and paroxysmal nocturnal dyspnea.  Patient was admitted to medicine service with Cardiology consulted for further evaluation and management of acute on chronic diastolic CHF.    Patient is being treated with IV Lasix  and volume status is gradually improving.  Assessment and Plan: Acute on chronic diastolic CHF (congestive heart failure) (HCC) TTE 9/5: EF 60-65%, normal LV function and RV. Mild to mod MVR.  Home meds: torsemide , lisinopril , and hydrochlorothiazide , and amlodipine . Poor compliance Dyspnea has improved since admission, continues to have orthopnea and BLE edema --Mgmt per Cardiology - appreciate recommendations. Transitioning to PO diuretics today.  --Toprol -XL, Farxiga , and losartan  --Started low dose spironolactone   --Echo pending since 9/5 -- report not in yet --Strict I/Os, daily weights  --TED hose   NSVT - no further episodes. Asymptomatic  --Cardiology increased metoprolol  --Maintain K>4, Mg>2 --Telemetry monitoring   Hyponatremia  - likely hypervolemic. Na improved 126 >> 134 today. --Diuresis as above --Monitor BMP   Hypokalemia - resolved --Monitor BMP, replace K PRN  Hypomagnesemia - Mg 1.9 --IV replacement ordered --Monitor & replace Mg PRN, goal > 2   Essential hypertension --mgmt as above --titrate regimen  --amlodipine  was stopped and low dose spironolactone  added for CHF  Type 2 diabetes mellitus with peripheral neuropathy Well controlled.  --Continue Neurontin   Microcytic anemia - Hemoglobin stable  --Continue iron  supplement    GERD without esophagitis - Will continue PPI therapy.  COPD Ashtma Not in exacerbation --Continue PRN ipratropium   Osteoarthritis --pain control PRN per orders    {Tip this will not be part of the note when signed Body mass index is 28.61 kg/m. , ,  (Optional):26781}  Subjective: Pt reports great improvement in her overall status. She still has LE swelling but thinks this is close to her baseline swelling. Has compression stockings at home but says they are too small to get on.  Physical Exam: Vitals:   05/27/24 2108 05/27/24 2253 05/28/24 0357 05/28/24 0513  BP: (!) 131/53 (!) 118/53 139/60   Pulse:  66 65   Resp:  20 18   Temp:  98.5 F (36.9 C) 97.6 F (36.4 C)   TempSrc:      SpO2:  95% 95%   Weight:    75.6 kg  Height:       General exam: alert, NAD HEENT: moist mucus membranes, hearing grossly normal  Respiratory system: CTAB diminished bases, no wheezes, rales or rhonchi, normal respiratory effort. Cardiovascular system: normal S1/S2, RRR, 2/6 systolic murmur, BLE edema still 2+ pitting on bilateral ankles and tender to palpation. No tightness to skin  Gastrointestinal system: soft, NT, ND Central nervous system: no gross focal neurologic deficits, normal speech Skin: dry, intact, normal temperature Psychiatry: normal mood, congruent affect, judgement and insight appear normal  Data Reviewed:    Latest Ref Rng & Units 05/28/2024    3:29 AM 05/27/2024    4:39 AM 05/26/2024    4:21 PM  BMP  Glucose 70 - 99 mg/dL 858  87    BUN 8 - 23 mg/dL 15  13    Creatinine 9.55 -  1.00 mg/dL 8.83  8.90    Sodium 864 - 145 mmol/L 130  134  130   Potassium 3.5 - 5.1 mmol/L 3.8  3.7    Chloride 98 - 111 mmol/L 94  96    CO2 22 - 32 mmol/L 29  30    Calcium  8.9 - 10.3 mg/dL 7.9  8.4        Latest Ref Rng & Units 05/27/2024    4:39 AM 05/26/2024    4:21 PM 05/26/2024    4:53 AM  CBC  WBC 4.0 - 10.5 K/uL 4.6   4.6   Hemoglobin 12.0 - 15.0 g/dL 8.9  9.0  8.7   Hematocrit  36.0 - 46.0 % 28.7  29.8  28.2   Platelets 150 - 400 K/uL 164   154    Family Communication: None present on rounds. Will attempt to call as time allows.  Disposition: Status is: Inpatient Remains inpatient appropriate because: remains on IV diuresis, electrolyte derangements, cardiology following    Planned Discharge Destination: HH vs SNF {Tip this will not be part of the note when signed  DVT Prophylaxis  ., Place ted hose  Enoxaparin  (lovenox ) injection 40 mg  (Optional):26781}   Time spent: 50 minutes including time at bedside and in coordination of care with staff and consultant/s.   Author: Marien LITTIE Piety, MD 05/28/2024 7:17 AM  For on call review [email or web address].

## 2024-05-28 NOTE — TOC Transition Note (Signed)
 Transition of Care [facility]) - Discharge Note   Patient Details  Name: Kendra [facility] MRN: [id number] Date of Birth: [birth year]  [facility] CM/SW Contact:  Victory Jackquline RAMAN, RN Phone Number: 05/28/2024, 10:12 AM   Clinical Narrative:   Patient has orders to discharge home today. RNCM met with patient, No family at bedside. RNCM introduced role and explained that therapy recommendations would be discussed. Pt is agreeable to Home Health, no agency preference. [name] has accepted for PT/OT/Aide. Pt declined RW states that she has a standard walker at home. No further concerns. Son will transport her once he gets off work. RNCM signing off.    Final next level of care: Home w Home Health Services Barriers to Discharge: No Barriers Identified   Patient Goals and CMS Choice   CMS Medicare.gov Compare Post Acute Care list provided to:: Patient        Discharge Placement                Patient to be transferred to facility by: Son   Patient and family notified of of transfer: 05/28/24  Discharge Plan and Services Additional resources added to the After Visit Summary for                            [facility] Arranged: PT, OT, Nurse's Aide [facility] Agency: [facility] Date [facility] Agency Contacted: 05/28/24   Representative spoke with at [facility] Agency: Darleene  Social Drivers of Health (SDOH) Interventions SDOH Screenings   Food Insecurity: No Food Insecurity (05/23/2024)  Housing: Low Risk  (05/23/2024)  Transportation Needs: No Transportation Needs (05/23/2024)  Utilities: Not At Risk (05/23/2024)  Financial Resource Strain: Low Risk  (09/09/2023)   Received from [facility]  Social Connections: Unknown (05/23/2024)  Tobacco Use: Low Risk  (05/21/2024)     Readmission Risk Interventions     No data to display

## 2024-05-28 NOTE — Discharge Instructions (Signed)
 Please review your medications carefully.  The heart team and I have made several changes to your blood pressure medications and diuretics so the new medication list should replace your old list.   Follow up with the heart team in 1 week in their office

## 2024-05-28 NOTE — Discharge Summary (Signed)
 Physician Discharge Summary  Patient: Kendra Clarke FMW:[id number] DOB: 10-Jul-1941   Code Status: Limited: Do not attempt resuscitation (DNR) -DNR-LIMITED -Do Not Intubate/DNI  Admit date: 05/21/2024 Discharge date: 05/28/2024 Disposition: Home health, PT, OT, and nurse aid PCP: Donnie Handing, PA  Recommendations for Outpatient Follow-up:  Follow up with PCP within 1-2 weeks Regarding general hospital follow up and preventative care Recommend  Follow up with cardiology   Discharge Diagnoses:  Principal Problem:   Acute on chronic diastolic CHF (congestive heart failure) (HCC) Active Problems:   Essential hypertension   Type 2 diabetes mellitus with peripheral neuropathy (HCC)   GERD without esophagitis   Hypervolemia  Brief Hospital Course Summary: Kendra Clarke is a 83 y.o. female with medical history significant for COPD, asthma, osteoarthritis, type 2 diabetes mellitus and hypertension, presented to the emergency room with acute onset of worsening dyspnea with associated 4 pillow orthopnea and paroxysmal nocturnal dyspnea.  Patient was admitted to medicine service with Cardiology consulted for further evaluation and management of acute on chronic diastolic CHF.   She was treated with IV lasix  and showed vast improvement in volume status. Her breathing improved to ability to ambulate without dyspnea. She did still have some LE swelling but endorsed that it was her baseline appearance.  Home medication regimen changed significantly in setting of bradycardia and hypertension as listed below.   PT/OT evaluated and recommended HH at dc.   All other chronic conditions were treated with home medications.    Discharge Condition: Good, improved Recommended discharge diet: Cardiac diet  Consultations: Cardiology   Procedures/Studies: Echo   Allergies as of 05/28/2024       Reactions   Albuterol  Anaphylaxis, Rash   Other reaction(s): Other (See Comments) Patient states it  gives her an asthma attack Other reaction(s): Other (See Comments), Respiratory Distress Patient states it gives her an asthma attack Other reaction(s): Other (See Comments) Patient states it gives her an asthma attack   Codeine Anxiety, Shortness Of Breath   Tramadol Hives   Sulfa Antibiotics Rash   Tylenol  [acetaminophen ] Other (See Comments)   AMS per pt report        Medication List     STOP taking these medications    amLODipine  10 MG tablet Commonly known as: NORVASC    gabapentin 100 MG capsule Commonly known as: NEURONTIN   hydrochlorothiazide  12.5 MG capsule Commonly known as: MICROZIDE    lisinopril  20 MG tablet Commonly known as: ZESTRIL    metoprolol  tartrate 50 MG tablet Commonly known as: LOPRESSOR    Trulicity 0.75 MG/0.5ML Soaj Generic drug: Dulaglutide       TAKE these medications    atorvastatin  40 MG tablet Commonly known as: LIPITOR Take 1 tablet (40 mg total) by mouth daily.   Atrovent  HFA 17 MCG/ACT inhaler Generic drug: ipratropium Inhale into the lungs.   benzonatate  200 MG capsule Commonly known as: TESSALON  Take 1 capsule (200 mg total) by mouth 3 (three) times daily as needed for cough.   dapagliflozin  propanediol 10 MG Tabs tablet Commonly known as: FARXIGA  Take 1 tablet (10 mg total) by mouth daily.   ergocalciferol  1.25 MG (50000 UT) capsule Commonly known as: VITAMIN D2 Take 50,000 Units by mouth once a week.   losartan  50 MG tablet Commonly known as: COZAAR  Take 1 tablet (50 mg total) by mouth daily.   meloxicam  15 MG tablet Commonly known as: MOBIC  Take 1 tablet (15 mg total) by mouth daily as needed for pain. What changed:  when to take this reasons to take this   metoprolol  succinate 50 MG 24 hr tablet Commonly known as: TOPROL -XL Take 1 tablet (50 mg total) by mouth daily. Take with or immediately following a meal.   pantoprazole  40 MG tablet Commonly known as: Protonix  Take 1 tablet (40 mg total) by mouth 2  (two) times daily before a meal.   potassium chloride  10 MEQ tablet Commonly known as: KLOR-CON  M Take 10 mEq by mouth 2 (two) times daily.   spironolactone  25 MG tablet Commonly known as: ALDACTONE  Take 1 tablet (25 mg total) by mouth daily.   Symbicort  160-4.5 MCG/ACT inhaler Generic drug: budesonide -formoterol  INHALE 2 PUFFS TWICE DAILY FOR ASTHMA   torsemide  20 MG tablet Commonly known as: DEMADEX  Take 1 tablet (20 mg total) by mouth daily. What changed: how much to take               Durable Medical Equipment  (From admission, onward)           Start     Ordered   05/27/24 1012  For home use only DME Other see comment  Once       Comments: TED hose, compression stockings or similar  Question:  Length of Need  Answer:  6 Months   05/27/24 1011            Follow-up Information     Custovic, Sabina, DO. Go in 2 week(s).   Specialty: Cardiology Contact information: [address] [place] [address] [phone]         Hudson, Caralyn, PA-C. Go in 1 week(s).   Specialty: Cardiology Why: HF Contact information: [address] [place] [address] [phone]                Subjective   Pt reports feeling well. Has some tenderness to palpation of her ankles but no different than her baseline. Denies SOB. No chest pain.   All questions and concerns were addressed at time of discharge.  Objective  Blood pressure (!) 145/60, pulse 70, temperature 98 F (36.7 C), resp. rate 17, height 5' 4 (1.626 m), weight 75.6 kg, SpO2 95%.   General: Pt is alert, awake, not in acute distress Cardiovascular: RRR, S1/S2 +, no rubs, no gallops Respiratory: CTA bilaterally, no wheezing, no rhonchi Abdominal: Soft, NT, ND, bowel sounds + Extremities: 1+ edema to ankles, no cyanosis  The results of significant diagnostics from this hospitalization (including imaging, microbiology, ancillary and laboratory) are listed below for reference.    Imaging studies: ECHOCARDIOGRAM COMPLETE Result Date: 05/27/2024    ECHOCARDIOGRAM REPORT   Patient Name:   Kendra Clarke Date of Exam: 05/22/2024 Medical Rec #:  [id number]       Height:       64.0 in Accession #:    [id number]      Weight:       219.1 lb Date of Birth:  06-06-1941      BSA:          2.034 m Patient Age:    82 years        BP:           117/86 mmHg Patient Gender: F               HR:           105 bpm. Exam Location:  [facility] Procedure: 2D Echo, Cardiac Doppler and Color Doppler (Both Spectral and Color  Flow Doppler were utilized during procedure). Indications:     CHF  History:         Patient has prior history of Echocardiogram examinations, most                  recent 08/28/2023. CHF, COPD; Risk Factors:Hypertension,                  Diabetes and Dyslipidemia. CKD STAGE 3.  Sonographer:     Philomena Daring Referring Phys:  [id number] HJAMPZOOJ DECOSTE Diagnosing Phys: Marsa Dooms MD IMPRESSIONS  1. Left ventricular ejection fraction, by estimation, is 60 to 65%. The left ventricle has normal function. The left ventricle has no regional wall motion abnormalities. Left ventricular diastolic parameters were normal.  2. Right ventricular systolic function is normal. The right ventricular size is normal.  3. The mitral valve is normal in structure. Mild to moderate mitral valve regurgitation. No evidence of mitral stenosis.  4. The aortic valve is normal in structure. Aortic valve regurgitation is not visualized. Mild to moderate aortic valve stenosis.  5. The inferior vena cava is normal in size with greater than 50% respiratory variability, suggesting right atrial pressure of 3 mmHg. FINDINGS  Left Ventricle: Left ventricular ejection fraction, by estimation, is 60 to 65%. The left ventricle has normal function. The left ventricle has no regional wall motion abnormalities. Strain was performed and the global longitudinal strain is indeterminate. The left ventricular internal cavity  size was normal in size. There is no left ventricular hypertrophy. Left ventricular diastolic parameters were normal. Right Ventricle: The right ventricular size is normal. No increase in right ventricular wall thickness. Right ventricular systolic function is normal. Left Atrium: Left atrial size was normal in size. Right Atrium: Right atrial size was normal in size. Pericardium: There is no evidence of pericardial effusion. Mitral Valve: The mitral valve is normal in structure. Mild to moderate mitral valve regurgitation. No evidence of mitral valve stenosis. MV peak gradient, 10.5 mmHg. The mean mitral valve gradient is 7.0 mmHg. Tricuspid Valve: The tricuspid valve is normal in structure. Tricuspid valve regurgitation is not demonstrated. No evidence of tricuspid stenosis. Aortic Valve: The aortic valve is normal in structure. Aortic valve regurgitation is not visualized. Mild to moderate aortic stenosis is present. Aortic valve mean gradient measures 12.5 mmHg. Aortic valve peak gradient measures 22.3 mmHg. Aortic valve area, by VTI measures 1.53 cm. Pulmonic Valve: The pulmonic valve was normal in structure. Pulmonic valve regurgitation is not visualized. No evidence of pulmonic stenosis. Aorta: The aortic root is normal in size and structure. Venous: The inferior vena cava is normal in size with greater than 50% respiratory variability, suggesting right atrial pressure of 3 mmHg. IAS/Shunts: No atrial level shunt detected by color flow Doppler. Additional Comments: 3D was performed not requiring image post processing on an independent workstation and was indeterminate.  LEFT VENTRICLE PLAX 2D LVIDd:         4.93 cm   Diastology LVIDs:         3.48 cm   LV e' medial:    4.16 cm/s LV PW:         1.03 cm   LV E/e' medial:  34.6 LV IVS:        1.16 cm   LV e' lateral:   8.32 cm/s LVOT diam:     1.80 cm   LV E/e' lateral: 17.3 LV SV:         69 LV SV Index:  34 LVOT Area:     2.54 cm  RIGHT VENTRICLE              IVC RV S prime:     10.70 cm/s  IVC diam: 2.68 cm TAPSE (M-mode): 1.8 cm LEFT ATRIUM             Index        RIGHT ATRIUM           Index LA diam:        3.30 cm 1.62 cm/m   RA Area:     17.20 cm LA Vol (A2C):   51.6 ml 25.37 ml/m  RA Volume:   45.90 ml  22.57 ml/m LA Vol (A4C):   49.4 ml 24.29 ml/m LA Biplane Vol: 51.5 ml 25.32 ml/m  AORTIC VALVE AV Area (Vmax):    1.53 cm AV Area (Vmean):   1.49 cm AV Area (VTI):     1.53 cm AV Vmax:           236.00 cm/s AV Vmean:          168.500 cm/s AV VTI:            0.449 m AV Peak Grad:      22.3 mmHg AV Mean Grad:      12.5 mmHg LVOT Vmax:         142.00 cm/s LVOT Vmean:        98.750 cm/s LVOT VTI:          0.270 m LVOT/AV VTI ratio: 0.60  AORTA Ao Root diam: 2.50 cm Ao Asc diam:  2.70 cm MITRAL VALVE                TRICUSPID VALVE MV Area (PHT): 5.50 cm     TR Peak grad:   22.5 mmHg MV Area VTI:   2.14 cm     TR Vmax:        237.00 cm/s MV Peak grad:  10.5 mmHg MV Mean grad:  7.0 mmHg     SHUNTS MV Vmax:       1.62 m/s     Systemic VTI:  0.27 m MV Vmean:      131.0 cm/s   Systemic Diam: 1.80 cm MV Decel Time: 138 msec MV E velocity: 144.00 cm/s MV A velocity: 142.00 cm/s MV E/A ratio:  1.01 Marsa Dooms MD Electronically signed by Marsa Dooms MD Signature Date/Time: 05/27/2024/9:37:10 AM    Final    US  ARTERIAL ABI (SCREENING LOWER EXTREMITY) Result Date: 05/24/2024 CLINICAL DATA:  Lower extremity edema EXAM: NONINVASIVE PHYSIOLOGIC VASCULAR STUDY OF BILATERAL LOWER EXTREMITIES TECHNIQUE: Evaluation of both lower extremities were performed at rest, including calculation of ankle-brachial indices with single level Doppler, pressure and pulse volume recording. COMPARISON:  None Available. FINDINGS: Right ABI: Unable to calculate due to elevated velocities at the ankle and the inability to perform a blood pressure in the right upper extremity due to indwelling IV. Left ABI:  Not calculated due to elevated velocities at the ankle. Right Lower  Extremity:  Normal arterial waveforms at the ankle. Left Lower Extremity:  Normal arterial waveforms at the ankle. IMPRESSION: 1. Ankle brachial indices cannot be calculated due to noncompressible vessel calcifications (medial arterial sclerosis of Monckeberg), as well as the inability to perform a right upper extremity blood pressure due to indwelling IV. Electronically Signed   By: Ozell Daring M.D.   On: 05/24/2024 16:23   DG Chest Portable 1 View Result Date:  05/21/2024 CLINICAL DATA:  Shortness of breath EXAM: PORTABLE CHEST 1 VIEW COMPARISON:  Chest x-ray 08/27/2023 FINDINGS: The heart is enlarged. There are increased central interstitial markings bilaterally. There is a cluster of micro nodules in the right upper lobe. There is atelectatic change in both lung bases. There is no pleural effusion or pneumothorax. No acute fractures are seen. IMPRESSION: 1. Cardiomegaly with increased central interstitial markings bilaterally, likely pulmonary edema. 2. Cluster of micro nodules in the right upper lobe, likely infectious or inflammatory. Electronically Signed   By: Greig Pique M.D.   On: 05/21/2024 22:44    Labs: Basic Metabolic Panel: Recent Labs  Lab 05/23/24 0545 05/24/24 0515 05/25/24 0358 05/26/24 0453 05/26/24 1621 05/27/24 0439 05/28/24 0329  NA 128* 128* 126* 130* 130* 134* 130*  K 3.5 3.2* 3.6 3.3*  --  3.7 3.8  CL 90* 91* 89* 90*  --  96* 94*  CO2 29 29 29 30   --  30 29  GLUCOSE 114* 108* 94 94  --  87 141*  BUN 13 12 13 13   --  13 15  CREATININE 0.85 0.85 0.96 0.95  --  1.09* 1.16*  CALCIUM  8.1* 7.8* 8.0* 8.0*  --  8.4* 7.9*  MG 1.2* 1.9 1.7 1.5*  --  1.9  --    CBC: Recent Labs  Lab 05/21/24 2211 05/22/24 0610 05/23/24 0545 05/24/24 0515 05/25/24 0358 05/26/24 0453 05/26/24 1621 05/27/24 0439  WBC 6.5   < > 5.6 5.5 4.8 4.6  --  4.6  NEUTROABS 5.6  --   --   --   --   --   --   --   HGB 8.7*   < > 8.4* 8.4* 8.8* 8.7* 9.0* 8.9*  HCT 28.5*   < > 27.5* 26.2*  28.6* 28.2* 29.8* 28.7*  MCV 78.5*   < > 77.0* 75.9* 76.9* 76.6*  --  78.6*  PLT 169   < > 135* 228 139* 154  --  164   < > = values in this interval not displayed.   Microbiology: Results for orders placed or performed during the hospital encounter of 05/21/24  Resp panel by RT-PCR (RSV, Flu A&B, Covid) Anterior Nasal Swab     Status: None   Collection Time: 05/21/24 10:12 PM   Specimen: Anterior Nasal Swab  Result Value Ref Range Status   SARS Coronavirus 2 by RT PCR NEGATIVE NEGATIVE Final    Comment: (NOTE) SARS-CoV-2 target nucleic acids are NOT DETECTED.  The SARS-CoV-2 RNA is generally detectable in upper respiratory specimens during the acute phase of infection. The lowest concentration of SARS-CoV-2 viral copies this assay can detect is 138 copies/mL. A negative result does not preclude SARS-Cov-2 infection and should not be used as the sole basis for treatment or other patient management decisions. A negative result may occur with  improper specimen collection/handling, submission of specimen other than nasopharyngeal swab, presence of viral mutation(s) within the areas targeted by this assay, and inadequate number of viral copies(<138 copies/mL). A negative result must be combined with clinical observations, patient history, and epidemiological information. The expected result is Negative.  Fact Sheet for Patients:  BloggerCourse.com  Fact Sheet for Healthcare Providers:  SeriousBroker.it  This test is no t yet approved or cleared by the [place]  FDA and  has been authorized for detection and/or diagnosis of SARS-CoV-2 by FDA under an Emergency Use Authorization (EUA). This EUA will remain  in effect (meaning this test can be  used) for the duration of the COVID-19 declaration under Section 564(b)(1) of the Act, 21 U.S.C.section 360bbb-3(b)(1), unless the authorization is terminated  or revoked sooner.        Influenza A by PCR NEGATIVE NEGATIVE Final   Influenza B by PCR NEGATIVE NEGATIVE Final    Comment: (NOTE) The Xpert Xpress SARS-CoV-2/FLU/RSV plus assay is intended as an aid in the diagnosis of influenza from Nasopharyngeal swab specimens and should not be used as a sole basis for treatment. Nasal washings and aspirates are unacceptable for Xpert Xpress SARS-CoV-2/FLU/RSV testing.  Fact Sheet for Patients: BloggerCourse.com  Fact Sheet for Healthcare Providers: SeriousBroker.it  This test is not yet approved or cleared by the [place]  FDA and has been authorized for detection and/or diagnosis of SARS-CoV-2 by FDA under an Emergency Use Authorization (EUA). This EUA will remain in effect (meaning this test can be used) for the duration of the COVID-19 declaration under Section 564(b)(1) of the Act, 21 U.S.C. section 360bbb-3(b)(1), unless the authorization is terminated or revoked.     Resp Syncytial Virus by PCR NEGATIVE NEGATIVE Final    Comment: (NOTE) Fact Sheet for Patients: BloggerCourse.com  Fact Sheet for Healthcare Providers: SeriousBroker.it  This test is not yet approved or cleared by the [place]  FDA and has been authorized for detection and/or diagnosis of SARS-CoV-2 by FDA under an Emergency Use Authorization (EUA). This EUA will remain in effect (meaning this test can be used) for the duration of the COVID-19 declaration under Section 564(b)(1) of the Act, 21 U.S.C. section 360bbb-3(b)(1), unless the authorization is terminated or revoked.  Performed at [facility], [address] [place], [place] [address]   Blood culture (routine x 2)     Status: None (Preliminary result)   Collection Time: 05/21/24 10:14 PM   Specimen: BLOOD LEFT HAND  Result Value Ref Range Status   Specimen Description BLOOD LEFT HAND  Final   Special Requests    Final    BOTTLES DRAWN AEROBIC AND ANAEROBIC Blood Culture results may not be optimal due to an inadequate volume of blood received in culture bottles   Culture   Final    NO GROWTH 4 DAYS Performed at [facility], [address], [place], [place] [address]    Report Status PENDING  Incomplete  Blood culture (routine x 2)     Status: None (Preliminary result)   Collection Time: 05/21/24 10:15 PM   Specimen: Left Antecubital; Blood  Result Value Ref Range Status   Specimen Description LEFT ANTECUBITAL  Final   Special Requests   Final    BOTTLES DRAWN AEROBIC AND ANAEROBIC Blood Culture results may not be optimal due to an inadequate volume of blood received in culture bottles   Culture   Final    NO GROWTH 4 DAYS Performed at [facility], [address], [place], [place] [address]    Report Status PENDING  Incomplete    Time coordinating discharge: Over 30 minutes  Marien LITTIE Piety, MD  [facility] 05/28/2024, 9:05 AM

## 2024-05-28 NOTE — Progress Notes (Signed)
 [facility] CARDIOLOGY PROGRESS NOTE       [name] MRN: [id number] DOB/AGE: 83-Dec-1942 83 y.o.  Admit date: 05/21/2024 Referring Physician Dr. Lawence Primary Physician Donnie Handing, PA Primary Cardiologist None Reason for Consultation CHF  HPI: Kendra Clarke is a 83 y.o. female  with a past medical history of hypertension, type 2 diabetes, COPD, asthma, osteoarthritis who presented to the ED on 05/21/2024 for worsening dyspnea, lower extremity swelling and 4 pillow orthopnea.  Patient diagnosed with congestive heart failure in 2023 by [facility], however has not seen a cardiologist. Cardiology was consulted for further evaluation.   Interval History:   -Patient seen and examined this AM and laying comfortably in hospital bed, laying completely flat. Lungs CTAB. LEE much improved, trace. Appears near euvolemia -Patients BP and HR stable this AM. Per tele no more recurrence of NSVT. -Patient remains on room air with stable SpO2.    Review of systems complete and found to be negative unless listed above    Past Medical History:  Diagnosis Date   Arthritis    Asthma    COPD (chronic obstructive pulmonary disease) (HCC)    Diabetes mellitus without complication (HCC)    Hypertension    Liver disease    Umbilical hernia     Past Surgical History:  Procedure Laterality Date   ABDOMINAL HYSTERECTOMY     BLADDER SURGERY     CHOLECYSTECTOMY     ESOPHAGOGASTRODUODENOSCOPY N/A 09/03/2023   Procedure: ESOPHAGOGASTRODUODENOSCOPY (EGD);  Surgeon: Onita Elspeth Sharper, DO;  Location: [facility] ENDOSCOPY;  Service: Gastroenterology;  Laterality: N/A;   nose surgery      Medications Prior to Admission  Medication Sig Dispense Refill Last Dose/Taking   amLODipine  (NORVASC ) 10 MG tablet Take 10 mg by mouth daily.   Taking   ATROVENT  HFA 17 MCG/ACT inhaler Inhale into the lungs.   Taking   benzonatate  (TESSALON ) 200 MG capsule Take 1 capsule (200 mg total) by mouth 3 (three)  times daily as needed for cough. 20 capsule 0 Taking As Needed   [Paused] Dulaglutide (TRULICITY) 0.75 MG/0.5ML SOPN Inject 0.75 mg into the skin once a week.   Taking   ergocalciferol  (VITAMIN D2) 1.25 MG (50000 UT) capsule Take 50,000 Units by mouth once a week.   Taking   hydrochlorothiazide  (MICROZIDE ) 12.5 MG capsule Take 12.5 mg by mouth daily.   Taking   lisinopril  (ZESTRIL ) 20 MG tablet Take 20 mg by mouth daily.   Taking   meloxicam  (MOBIC ) 15 MG tablet Take 15 mg by mouth daily.   Taking   pantoprazole  (PROTONIX ) 40 MG tablet Take 1 tablet (40 mg total) by mouth 2 (two) times daily before a meal. 30 tablet 1 Taking   potassium chloride  (KLOR-CON  M) 10 MEQ tablet Take 10 mEq by mouth 2 (two) times daily.   Taking   SYMBICORT  160-4.5 MCG/ACT inhaler INHALE 2 PUFFS TWICE DAILY FOR ASTHMA   Taking   torsemide  (DEMADEX ) 20 MG tablet Take 40 mg by mouth daily.   Taking   gabapentin (NEURONTIN) 100 MG capsule Take 100 mg by mouth 2 (two) times daily. (Patient not taking: Reported on 05/21/2024)   Not Taking   metoprolol  tartrate (LOPRESSOR ) 50 MG tablet Take 50 mg by mouth 2 (two) times daily. (Patient not taking: Reported on 05/21/2024)   Not Taking   Social History   Socioeconomic History   Marital status: Widowed    Spouse name: Not on file   Number of  children: Not on file   Years of education: Not on file   Highest education level: Not on file  Occupational History   Not on file  Tobacco Use   Smoking status: Never   Smokeless tobacco: Never  Vaping Use   Vaping status: Never Used  Substance and Sexual Activity   Alcohol use: No   Drug use: No   Sexual activity: Not on file  Other Topics Concern   Not on file  Social History Narrative   Not on file   Social Drivers of Health   Financial Resource Strain: Low Risk  (09/09/2023)   Received from [facility]   Overall Financial Resource Strain (CARDIA)    Difficulty of Paying Living Expenses: Not very hard   Food Insecurity: No Food Insecurity (05/23/2024)   Hunger Vital Sign    Worried About Running Out of Food in the Last Year: Never true    Ran Out of Food in the Last Year: Never true  Transportation Needs: No Transportation Needs (05/23/2024)   PRAPARE - Administrator, Civil Service (Medical): No    Lack of Transportation (Non-Medical): No  Physical Activity: Not on file  Stress: Not on file  Social Connections: Unknown (05/23/2024)   Social Connection and Isolation Panel    Frequency of Communication with Friends and Family: More than three times a week    Frequency of Social Gatherings with Friends and Family: More than three times a week    Attends Religious Services: Not on file    Active Member of Clubs or Organizations: Not on file    Attends Banker Meetings: Not on file    Marital Status: Not on file  Intimate Partner Violence: Not At Risk (05/23/2024)   Humiliation, Afraid, Rape, and Kick questionnaire    Fear of Current or Ex-Partner: No    Emotionally Abused: No    Physically Abused: No    Sexually Abused: No    Family History  Problem Relation Age of Onset   Multiple sclerosis Mother    Hypertension Father    COPD Father    Varicose Veins Maternal Aunt    Stroke Nephew      Vitals:   05/27/24 2108 05/27/24 2253 05/28/24 0357 05/28/24 0513  BP: (!) 131/53 (!) 118/53 139/60   Pulse:  66 65   Resp:  20 18   Temp:  98.5 F (36.9 C) 97.6 F (36.4 C)   TempSrc:      SpO2:  95% 95%   Weight:    75.6 kg  Height:        PHYSICAL EXAM General: Chronically ill-appearing elderly female, well nourished, in no acute distress. HEENT: Normocephalic and atraumatic. Neck: No JVD.   Lungs: Normal respiratory effort on room air. CTAB Heart: HRR, elevated rate. Normal S1 and S2 without gallops or murmurs.  Abdomen: Non-distended appearing.  Msk: Normal strength and tone for age. Extremities: Warm and well perfused. No clubbing, cyanosis. Trace edema.   Neuro: Alert and oriented X 3. Psych: Answers questions appropriately.   Labs: Basic Metabolic Panel: Recent Labs    05/26/24 0453 05/26/24 1621 05/27/24 0439 05/28/24 0329  NA 130*   < > 134* 130*  K 3.3*  --  3.7 3.8  CL 90*  --  96* 94*  CO2 30  --  30 29  GLUCOSE 94  --  87 141*  BUN 13  --  13 15  CREATININE 0.95  --  1.09* 1.16*  CALCIUM  8.0*  --  8.4* 7.9*  MG 1.5*  --  1.9  --    < > = values in this interval not displayed.   Liver Function Tests: No results for input(s): AST, ALT, ALKPHOS, BILITOT, PROT, ALBUMIN  in the last 72 hours.  No results for input(s): LIPASE, AMYLASE in the last 72 hours.  CBC: Recent Labs    05/26/24 0453 05/26/24 1621 05/27/24 0439  WBC 4.6  --  4.6  HGB 8.7* 9.0* 8.9*  HCT 28.2* 29.8* 28.7*  MCV 76.6*  --  78.6*  PLT 154  --  164   Cardiac Enzymes: Recent Labs    05/25/24 1130  TROPONINIHS 22*    BNP: No results for input(s): BNP in the last 72 hours.  D-Dimer: No results for input(s): DDIMER in the last 72 hours. Hemoglobin A1C: No results for input(s): HGBA1C in the last 72 hours. Fasting Lipid Panel: No results for input(s): CHOL, HDL, LDLCALC, TRIG, CHOLHDL, LDLDIRECT in the last 72 hours.  Thyroid Function Tests: No results for input(s): TSH, T4TOTAL, T3FREE, THYROIDAB in the last 72 hours.  Invalid input(s): FREET3 Anemia Panel: No results for input(s): VITAMINB12, FOLATE, FERRITIN, TIBC, IRON , RETICCTPCT in the last 72 hours.    Radiology: ECHOCARDIOGRAM COMPLETE Result Date: 05/27/2024    ECHOCARDIOGRAM REPORT   Patient Name:   Kendra Clarke Date of Exam: 05/22/2024 Medical Rec #:  [id number]       Height:       64.0 in Accession #:    [id number]      Weight:       219.1 lb Date of Birth:  1941-02-19      BSA:          2.034 m Patient Age:    82 years        BP:           117/86 mmHg Patient Gender: F               HR:           105 bpm. Exam  Location:  [facility] Procedure: 2D Echo, Cardiac Doppler and Color Doppler (Both Spectral and Color            Flow Doppler were utilized during procedure). Indications:     CHF  History:         Patient has prior history of Echocardiogram examinations, most                  recent 08/28/2023. CHF, COPD; Risk Factors:Hypertension,                  Diabetes and Dyslipidemia. CKD STAGE 3.  Sonographer:     Philomena Daring Referring Phys:  [id number] HJAMPZOOJ Stacee Earp Diagnosing Phys: Marsa Dooms MD IMPRESSIONS  1. Left ventricular ejection fraction, by estimation, is 60 to 65%. The left ventricle has normal function. The left ventricle has no regional wall motion abnormalities. Left ventricular diastolic parameters were normal.  2. Right ventricular systolic function is normal. The right ventricular size is normal.  3. The mitral valve is normal in structure. Mild to moderate mitral valve regurgitation. No evidence of mitral stenosis.  4. The aortic valve is normal in structure. Aortic valve regurgitation is not visualized. Mild to moderate aortic valve stenosis.  5. The inferior vena cava is normal in size with greater than 50% respiratory variability, suggesting right atrial pressure of 3 mmHg. FINDINGS  Left Ventricle:  Left ventricular ejection fraction, by estimation, is 60 to 65%. The left ventricle has normal function. The left ventricle has no regional wall motion abnormalities. Strain was performed and the global longitudinal strain is indeterminate. The left ventricular internal cavity size was normal in size. There is no left ventricular hypertrophy. Left ventricular diastolic parameters were normal. Right Ventricle: The right ventricular size is normal. No increase in right ventricular wall thickness. Right ventricular systolic function is normal. Left Atrium: Left atrial size was normal in size. Right Atrium: Right atrial size was normal in size. Pericardium: There is no evidence of pericardial effusion.  Mitral Valve: The mitral valve is normal in structure. Mild to moderate mitral valve regurgitation. No evidence of mitral valve stenosis. MV peak gradient, 10.5 mmHg. The mean mitral valve gradient is 7.0 mmHg. Tricuspid Valve: The tricuspid valve is normal in structure. Tricuspid valve regurgitation is not demonstrated. No evidence of tricuspid stenosis. Aortic Valve: The aortic valve is normal in structure. Aortic valve regurgitation is not visualized. Mild to moderate aortic stenosis is present. Aortic valve mean gradient measures 12.5 mmHg. Aortic valve peak gradient measures 22.3 mmHg. Aortic valve area, by VTI measures 1.53 cm. Pulmonic Valve: The pulmonic valve was normal in structure. Pulmonic valve regurgitation is not visualized. No evidence of pulmonic stenosis. Aorta: The aortic root is normal in size and structure. Venous: The inferior vena cava is normal in size with greater than 50% respiratory variability, suggesting right atrial pressure of 3 mmHg. IAS/Shunts: No atrial level shunt detected by color flow Doppler. Additional Comments: 3D was performed not requiring image post processing on an independent workstation and was indeterminate.  LEFT VENTRICLE PLAX 2D LVIDd:         4.93 cm   Diastology LVIDs:         3.48 cm   LV e' medial:    4.16 cm/s LV PW:         1.03 cm   LV E/e' medial:  34.6 LV IVS:        1.16 cm   LV e' lateral:   8.32 cm/s LVOT diam:     1.80 cm   LV E/e' lateral: 17.3 LV SV:         69 LV SV Index:   34 LVOT Area:     2.54 cm  RIGHT VENTRICLE             IVC RV S prime:     10.70 cm/s  IVC diam: 2.68 cm TAPSE (M-mode): 1.8 cm LEFT ATRIUM             Index        RIGHT ATRIUM           Index LA diam:        3.30 cm 1.62 cm/m   RA Area:     17.20 cm LA Vol (A2C):   51.6 ml 25.37 ml/m  RA Volume:   45.90 ml  22.57 ml/m LA Vol (A4C):   49.4 ml 24.29 ml/m LA Biplane Vol: 51.5 ml 25.32 ml/m  AORTIC VALVE AV Area (Vmax):    1.53 cm AV Area (Vmean):   1.49 cm AV Area (VTI):      1.53 cm AV Vmax:           236.00 cm/s AV Vmean:          168.500 cm/s AV VTI:            0.449 m AV Peak Grad:  22.3 mmHg AV Mean Grad:      12.5 mmHg LVOT Vmax:         142.00 cm/s LVOT Vmean:        98.750 cm/s LVOT VTI:          0.270 m LVOT/AV VTI ratio: 0.60  AORTA Ao Root diam: 2.50 cm Ao Asc diam:  2.70 cm MITRAL VALVE                TRICUSPID VALVE MV Area (PHT): 5.50 cm     TR Peak grad:   22.5 mmHg MV Area VTI:   2.14 cm     TR Vmax:        237.00 cm/s MV Peak grad:  10.5 mmHg MV Mean grad:  7.0 mmHg     SHUNTS MV Vmax:       1.62 m/s     Systemic VTI:  0.27 m MV Vmean:      131.0 cm/s   Systemic Diam: 1.80 cm MV Decel Time: 138 msec MV E velocity: 144.00 cm/s MV A velocity: 142.00 cm/s MV E/A ratio:  1.01 Marsa Dooms MD Electronically signed by Marsa Dooms MD Signature Date/Time: 05/27/2024/9:37:10 AM    Final    US  ARTERIAL ABI (SCREENING LOWER EXTREMITY) Result Date: 05/24/2024 CLINICAL DATA:  Lower extremity edema EXAM: NONINVASIVE PHYSIOLOGIC VASCULAR STUDY OF BILATERAL LOWER EXTREMITIES TECHNIQUE: Evaluation of both lower extremities were performed at rest, including calculation of ankle-brachial indices with single level Doppler, pressure and pulse volume recording. COMPARISON:  None Available. FINDINGS: Right ABI: Unable to calculate due to elevated velocities at the ankle and the inability to perform a blood pressure in the right upper extremity due to indwelling IV. Left ABI:  Not calculated due to elevated velocities at the ankle. Right Lower Extremity:  Normal arterial waveforms at the ankle. Left Lower Extremity:  Normal arterial waveforms at the ankle. IMPRESSION: 1. Ankle brachial indices cannot be calculated due to noncompressible vessel calcifications (medial arterial sclerosis of Monckeberg), as well as the inability to perform a right upper extremity blood pressure due to indwelling IV. Electronically Signed   By: Ozell Daring M.D.   On: 05/24/2024 16:23    DG Chest Portable 1 View Result Date: 05/21/2024 CLINICAL DATA:  Shortness of breath EXAM: PORTABLE CHEST 1 VIEW COMPARISON:  Chest x-ray 08/27/2023 FINDINGS: The heart is enlarged. There are increased central interstitial markings bilaterally. There is a cluster of micro nodules in the right upper lobe. There is atelectatic change in both lung bases. There is no pleural effusion or pneumothorax. No acute fractures are seen. IMPRESSION: 1. Cardiomegaly with increased central interstitial markings bilaterally, likely pulmonary edema. 2. Cluster of micro nodules in the right upper lobe, likely infectious or inflammatory. Electronically Signed   By: Greig Pique M.D.   On: 05/21/2024 22:44    ECHO as above.  TELEMETRY reviewed by me 05/28/2024: Sinus rhythm, rate 70s  EKG reviewed by me: sinus tachycardia, rate 113 bpm with RBBB, non-specific ST-T wave changes (similar to prior EKG).  Data reviewed by me 05/28/2024: last 24h vitals tele labs imaging I/O hospitalist progress notes.  Principal Problem:   Acute on chronic diastolic CHF (congestive heart failure) (HCC) Active Problems:   Essential hypertension   Type 2 diabetes mellitus with peripheral neuropathy (HCC)   GERD without esophagitis    ASSESSMENT AND PLAN:  Kendra Clarke is a 83 y.o. female  with a past medical history of hypertension, type 2 diabetes,  COPD, asthma, osteoarthritis who presented to the ED on 05/21/2024 for worsening dyspnea, lower extremity swelling and 4 pillow orthopnea.  Patient diagnosed with congestive heart failure in 2023 by [facility], however has not seen a cardiologist. Cardiology was consulted for further evaluation.   # Acute on chronic HFpEF # COPD BNP elevated at 1700. CXR with cardiomegaly and pulmonary vascular congestions. - Decreased PO torsemide  to 20 mg daily.  - Closely monitor UOP and renal function. - Continue dapagliflozin  10 mg daily. (Copay $0) - Continue spironolactone  25 mg daily.  #  Ventricular Ectopy Per tele on 09/09, had 9 beat run NSVTx1. No further recurrence of NSVT. - Monitor and replenish electrolytes for a goal K >4, Mag >2  - Continue metoprolol  succinate 50 mg daily.  - Continue to closely monitor tele.   # Hypertension # Hyperlipidemia Patient without chest pain.  EKG with non-specific ST-T wave changes (similar to prior EKG). Troponins minimally elevated and flat 60 > 60.  Lipid panel this admission within normal limits, LDL 80.  - Continue losartan  50 mg daily. - Continue metoprolol  as stated above.  - Continue atorvastatin  40 mg daily. - Minimally elevated and flat is most consistent with demand/supply mismatch and not ACS.  No further cardiac recommendations at this time. Cardiology will sign off. Please haiku with questions or re-engage if needed.   This patient's plan of care was discussed and created with Dr. Ammon and he is in agreement.  Signed: Yu Peggs, PA-C  05/28/2024, 7:00 AM [facility] Cardiology

## 2024-05-29 LAB — CULTURE, BLOOD (ROUTINE X 2)
Culture: NO GROWTH
Culture: NO GROWTH

## 2024-06-24 ENCOUNTER — Other Ambulatory Visit: Payer: Self-pay | Admitting: Student in an Organized Health Care Education/Training Program
